# Patient Record
Sex: Female | Born: 1960 | Race: White | Hispanic: No | Marital: Married | Smoking: Never smoker
Health system: Southern US, Community
[De-identification: ages and names within clinical notes are randomized; demographics above are authoritative.]

## PROBLEM LIST (undated history)

## (undated) DIAGNOSIS — D649 Anemia, unspecified: Secondary | ICD-10-CM

## (undated) DIAGNOSIS — C50919 Malignant neoplasm of unspecified site of unspecified female breast: Secondary | ICD-10-CM

## (undated) DIAGNOSIS — D3A029 Benign carcinoid tumor of the large intestine, unspecified portion: Secondary | ICD-10-CM

## (undated) DIAGNOSIS — K760 Fatty (change of) liver, not elsewhere classified: Secondary | ICD-10-CM

## (undated) DIAGNOSIS — Z923 Personal history of irradiation: Secondary | ICD-10-CM

## (undated) DIAGNOSIS — Z973 Presence of spectacles and contact lenses: Secondary | ICD-10-CM

## (undated) DIAGNOSIS — Z9889 Other specified postprocedural states: Secondary | ICD-10-CM

## (undated) DIAGNOSIS — K219 Gastro-esophageal reflux disease without esophagitis: Secondary | ICD-10-CM

## (undated) DIAGNOSIS — J302 Other seasonal allergic rhinitis: Secondary | ICD-10-CM

## (undated) HISTORY — DX: Malignant neoplasm of unspecified site of unspecified female breast: C50.919

## (undated) HISTORY — DX: Presence of spectacles and contact lenses: Z97.3

## (undated) HISTORY — DX: Fatty (change of) liver, not elsewhere classified: K76.0

## (undated) HISTORY — DX: Other specified postprocedural states: Z98.890

## (undated) HISTORY — DX: Personal history of irradiation: Z92.3

---

## 1985-12-07 HISTORY — PX: BOWEL RESECTION: SHX1257

## 1985-12-07 HISTORY — PX: APPENDECTOMY: SHX54

## 1990-12-07 HISTORY — PX: DILATION AND CURETTAGE OF UTERUS: SHX78

## 1998-12-07 DIAGNOSIS — Z9889 Other specified postprocedural states: Secondary | ICD-10-CM

## 1998-12-07 HISTORY — DX: Other specified postprocedural states: Z98.890

## 1999-03-29 ENCOUNTER — Other Ambulatory Visit: Admission: RE | Admit: 1999-03-29 | Discharge: 1999-03-29 | Payer: Self-pay | Admitting: *Deleted

## 2000-08-13 ENCOUNTER — Ambulatory Visit (HOSPITAL_COMMUNITY): Admission: RE | Admit: 2000-08-13 | Discharge: 2000-08-13 | Payer: Self-pay | Admitting: *Deleted

## 2001-04-29 ENCOUNTER — Other Ambulatory Visit: Admission: RE | Admit: 2001-04-29 | Discharge: 2001-04-29 | Payer: Self-pay | Admitting: *Deleted

## 2001-12-07 HISTORY — PX: CHOLECYSTECTOMY: SHX55

## 2002-04-03 ENCOUNTER — Other Ambulatory Visit: Admission: RE | Admit: 2002-04-03 | Discharge: 2002-04-03 | Payer: Self-pay | Admitting: Obstetrics and Gynecology

## 2002-05-15 ENCOUNTER — Encounter: Admission: RE | Admit: 2002-05-15 | Discharge: 2002-05-15 | Payer: Self-pay | Admitting: Family Medicine

## 2002-05-15 ENCOUNTER — Encounter: Payer: Self-pay | Admitting: Family Medicine

## 2002-10-25 ENCOUNTER — Observation Stay (HOSPITAL_COMMUNITY): Admission: RE | Admit: 2002-10-25 | Discharge: 2002-10-26 | Payer: Self-pay | Admitting: General Surgery

## 2002-10-25 ENCOUNTER — Encounter: Payer: Self-pay | Admitting: General Surgery

## 2003-04-10 ENCOUNTER — Other Ambulatory Visit: Admission: RE | Admit: 2003-04-10 | Discharge: 2003-04-10 | Payer: Self-pay | Admitting: Obstetrics and Gynecology

## 2012-01-08 HISTORY — PX: BREAST BIOPSY: SHX20

## 2012-01-18 ENCOUNTER — Other Ambulatory Visit: Payer: Self-pay

## 2012-01-19 ENCOUNTER — Other Ambulatory Visit: Payer: Self-pay | Admitting: Radiology

## 2012-01-19 DIAGNOSIS — C50912 Malignant neoplasm of unspecified site of left female breast: Secondary | ICD-10-CM

## 2012-01-21 ENCOUNTER — Telehealth: Payer: Self-pay | Admitting: *Deleted

## 2012-01-21 ENCOUNTER — Other Ambulatory Visit: Payer: Self-pay | Admitting: *Deleted

## 2012-01-21 DIAGNOSIS — C50519 Malignant neoplasm of lower-outer quadrant of unspecified female breast: Secondary | ICD-10-CM

## 2012-01-21 NOTE — Telephone Encounter (Signed)
Confirmed [facility] for 02/03/12 at 0800 .  Instructions and contact information given.

## 2012-01-22 ENCOUNTER — Ambulatory Visit
Admission: RE | Admit: 2012-01-22 | Discharge: 2012-01-22 | Disposition: A | Discharge status: Home / Self Care | Payer: PRIVATE HEALTH INSURANCE | Source: Ambulatory Visit | Attending: Radiology | Admitting: Radiology

## 2012-01-22 DIAGNOSIS — C50912 Malignant neoplasm of unspecified site of left female breast: Secondary | ICD-10-CM

## 2012-01-22 MED ORDER — GADOBENATE DIMEGLUMINE 529 MG/ML IV SOLN
15.0000 mL | Freq: Once | INTRAVENOUS | Status: AC | PRN
  Administered 2012-01-22: 15 mL via INTRAVENOUS

## 2012-02-03 ENCOUNTER — Ambulatory Visit (HOSPITAL_BASED_OUTPATIENT_CLINIC_OR_DEPARTMENT_OTHER): Payer: PRIVATE HEALTH INSURANCE | Admitting: Surgery

## 2012-02-03 ENCOUNTER — Encounter: Payer: Self-pay | Admitting: Oncology

## 2012-02-03 ENCOUNTER — Ambulatory Visit
Admission: RE | Admit: 2012-02-03 | Discharge: 2012-02-03 | Disposition: A | Discharge status: Home / Self Care | Payer: PRIVATE HEALTH INSURANCE | Source: Ambulatory Visit | Attending: Radiation Oncology | Admitting: Radiation Oncology

## 2012-02-03 ENCOUNTER — Other Ambulatory Visit (HOSPITAL_BASED_OUTPATIENT_CLINIC_OR_DEPARTMENT_OTHER): Payer: PRIVATE HEALTH INSURANCE | Admitting: Lab

## 2012-02-03 ENCOUNTER — Encounter (HOSPITAL_COMMUNITY): Payer: Self-pay | Admitting: Pharmacy Technician

## 2012-02-03 ENCOUNTER — Other Ambulatory Visit: Payer: Self-pay | Admitting: *Deleted

## 2012-02-03 ENCOUNTER — Telehealth: Payer: Self-pay | Admitting: Oncology

## 2012-02-03 ENCOUNTER — Encounter: Payer: Self-pay | Admitting: *Deleted

## 2012-02-03 ENCOUNTER — Ambulatory Visit: Payer: PRIVATE HEALTH INSURANCE

## 2012-02-03 ENCOUNTER — Ambulatory Visit: Payer: PRIVATE HEALTH INSURANCE | Attending: Surgery | Admitting: Physical Therapy

## 2012-02-03 ENCOUNTER — Other Ambulatory Visit (INDEPENDENT_AMBULATORY_CARE_PROVIDER_SITE_OTHER): Payer: Self-pay | Admitting: Surgery

## 2012-02-03 ENCOUNTER — Ambulatory Visit (HOSPITAL_BASED_OUTPATIENT_CLINIC_OR_DEPARTMENT_OTHER): Payer: PRIVATE HEALTH INSURANCE | Admitting: Oncology

## 2012-02-03 VITALS — BP 129/88 | HR 101 | Temp 98.6°F | Ht 68.5 in | Wt 164.1 lb

## 2012-02-03 DIAGNOSIS — C50519 Malignant neoplasm of lower-outer quadrant of unspecified female breast: Secondary | ICD-10-CM

## 2012-02-03 DIAGNOSIS — IMO0001 Reserved for inherently not codable concepts without codable children: Secondary | ICD-10-CM | POA: Insufficient documentation

## 2012-02-03 DIAGNOSIS — R293 Abnormal posture: Secondary | ICD-10-CM | POA: Insufficient documentation

## 2012-02-03 LAB — COMPREHENSIVE METABOLIC PANEL
ALT: 29 U/L (ref 0–35)
CO2: 28 mEq/L (ref 19–32)
Chloride: 104 mEq/L (ref 96–112)
Potassium: 3.7 mEq/L (ref 3.5–5.3)
Sodium: 139 mEq/L (ref 135–145)
Total Bilirubin: 0.4 mg/dL (ref 0.3–1.2)
Total Protein: 7 g/dL (ref 6.0–8.3)

## 2012-02-03 LAB — CBC WITH DIFFERENTIAL/PLATELET
BASO%: 0.6 % (ref 0.0–2.0)
MCHC: 33.5 g/dL (ref 31.5–36.0)
MONO#: 0.5 10*3/uL (ref 0.1–0.9)
RBC: 4.53 10*6/uL (ref 3.70–5.45)
RDW: 13.8 % (ref 11.2–14.5)
WBC: 5.2 10*3/uL (ref 3.9–10.3)
lymph#: 1.3 10*3/uL (ref 0.9–3.3)

## 2012-02-03 LAB — CANCER ANTIGEN 27.29: CA 27.29: 65 U/mL — ABNORMAL HIGH (ref 0–39)

## 2012-02-03 NOTE — Progress Notes (Addendum)
Re:   Sydney Cervantes DOB:   May 19, 1961 MRN:   [id number]  [facility]  ASSESSMENT AND PLAN: 1.  Left breast cancer.  T4, N3, Mx, at 11 o'clock.  [place] - 4.5 cm, MRI - 7.6 cm extending to pectoralis muscle.  Lobular, grade 3.  ER - 63%, PR - 5%, Her@Neu  - neg., Ki67 -   Left axillary node biopsy positive.  Treating oncology - Rubin/Wentworth.  I discussed the options for breast cancer treatment with the patient.  I discussed the idea of a multidisciplinary approach to the treatment of breast cancer, which includes medical oncology and radiation oncology.  I discussed the surgical options of lumpectomy vs. mastectomy.  I discussed the options of lymph node biopsy, but she is biopsy proven positive.  So she will almost certainly need a lymph node dissection.  The treatment plan depends on the pathologic staging of the tumor and the patient's personal wishes.  The risks of surgery include, but are not limited to, bleeding, infection, the need for further surgery, and nerve injury.  The patient has been given literature on the treatment of breast cancer.  She will need neoadjuvant therapy.  It is hard to imagine that she will not need a mastectomy, axillary node dissection, and probable chest wall irradiation after neoadjuvant chemotx.  2.  Level 1, 2 and 3 nodes positive - by MRI. 3.  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  4.  Will need power port.  Risks include, but are not limited to, bleeding, infection, thrombosis, and pneumothorax.  I showed them the model of the porta cath.  5.  Will need genetics. 6.  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    REFERRING PHYSICIAN:  Dag Pavic, [facility].  HISTORY OF PRESENT ILLNESS: Sydney Cervantes is a 51 y.o. (DOB: 09/11/1961)  whtie female whose primary care physician is Debbora Dus, NP, [facility], and comes to me today for left breast cancer.  She felt a mass in her left breast for the first time in Sept., 2012.  The  mass seemed to wax and wane in size.  Her mother had breast cancer in her 67's.  She then had a recurrence.  She is still living.  She has an aunt who also has breast cancer.  She is still having regular periods.  Her LMP was Jan 07, 2012.  She thinks her last mammogram was around 2007.    Past Medical History  Diagnosis Date  . Hx of colonoscopy 2000  . Wears glasses       Past Surgical History  Procedure Date  . Appendectomy   . Bowel resection   . Cholecystectomy       No current outpatient prescriptions on file.     No Known Allergies  REVIEW OF SYSTEMS: Skin:  No history of rash.  No history of abnormal moles. Infection:  No history of hepatitis or HIV.  No history of MRSA. Neurologic:  No history of stroke.  No history of seizure.  No history of headaches. Cardiac:  No history of hypertension. No history of heart disease.  No history of prior cardiac catheterization.  No history of seeing a cardiologist. Pulmonary:  Does not smoke cigarettes.  No asthma or bronchitis.  No OSA/CPAP.  Endocrine:  No diabetes. No thyroid disease. Gastrointestinal: Lap chole - 2000's by Dr. Carolynne Edouard, Appendiceal carcinoid - 1987, last colonoscopy about 2000. Urologic:  No history of kidney stones.  No history of bladder infections. Musculoskeletal:  No history of joint or back disease. Hematologic:  No bleeding disorder.  No history of anemia.  Not anticoagulated. Psycho-social:  The patient is oriented.   The patient has no obvious psychologic or social impairment to understanding our conversation and plan.  SOCIAL and FAMILY HISTORY: Married.  Husband, [name], with patient. They have 3 daughters - 22,19, 58. She works for [employer] for [employer].  PHYSICAL EXAM: There were no vitals taken for this visit.  General: WN WF who is alert and generally healthy appearing.  HEENT: Normal. Pupils equal. Good dentition. Neck: Supple. No mass.  No thyroid mass.  Carotid pulse okay  with no bruit. Lymph Nodes:  No supraclavicular, cervical, or axillary nodes.  I am not sure I can feel a positive note. Lungs: Clear to auscultation and symmetric breath sounds. Heart:  RRR. No murmur or rub. Breasts:  Left:  Large left breast mass - 7+cm with dimpling at 5 o'clock position.  The breast is larger with suggestion of edema of skin.  Right - unremarkable.  Abdomen: Soft. No mass. No tenderness. No hernia. Normal bowel sounds.  Lower midline scar.   Rectal: Not done. Extremities:  Good strength and ROM  in upper and lower extremities. Neurologic:  Grossly intact to motor and sensory function. Psychiatric: Has normal mood and affect. Behavior is normal.   DATA REVIEWED: Path, mammogram, and MRI of breasts.  Ovidio Kin, MD,  [facility] [address] [place]  [address]   [place], [place]    [address] Phone:  [phone] FAX:  [phone]

## 2012-02-03 NOTE — Progress Notes (Signed)
Referral MD  Reason for Referral: locally advanced breast cancer   No chief complaint on file. : 51 yo previously healthy woman who presented with self-detected breast cancer in September of 2012. She sought medical attention for this a few weeks ago when it begin to enlarge. She underwent imaging studies as detailed below.The largest dimension of this mass was 7.6 cm using MRI. Biopsy of this mass revealed a high grade lobular type cancer, er 63 %, pr 5%, her 2 negative, with an elevated ki67. Lt axillary node -biopsy +; Level I,II, and III  Nodes appear to be involved . Diffuse skin thickening was also seen.Clinical stage T4N3.Patient denies any pain or discharge from the breast. She has no other systemic complaints. HPI:   Past Medical History  Diagnosis Date  . Hx of colonoscopy 2000  . Wears glasses   :  Past Surgical History  Procedure Date  . Appendectomy with incidental carcinoid leading to bowel resection, 1987   . Bowel resection   . Cholecystectomy   :  No current outpatient prescriptions on file.:    :  No Known Allergies:  Family History  Problem Relation Age of Onset  . Breast cancer Mother   . Breast cancer Paternal Aunt   :  History   Social History  . Marital Status: Married  X 11 y    Spouse Name: scott    Number of Children: 3- 1- [employer]; 1- [employer]; 1- science and math  . Years of Education: N/A   Occupational History  . 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   Social History Main Topics  . Smoking status: Never Smoker   . Smokeless tobacco: Not on file  . Alcohol Use: 2.4 oz/week    4 Glasses of wine per week  . Drug Use: No  . Sexually Active:    Other Topics Concern  . Not on file   Social History Narrative  . No narrative on file  :  @Reproductive  History G3P2 Menarche -11 Continues to have menses  A comprehensive review of systems was negative.  Exam:  @IPVITALS @ General appearance: alert,  cooperative and appears stated age Head: Normocephalic, without obvious abnormality, atraumatic Throat: lips, mucosa, and tongue normal; teeth and gums normal Resp: clear to auscultation bilaterally and normal percussion bilaterally Chest wall: no tenderness Breasts: normal appearance, no masses or tenderness, rt breast normal; lt breast diffusely edematous with limited movement of breast. nipple retraction is noted. the breast is involved with a large mass.palpable axillary adenopathy is noted. Cardio: regular rate and rhythm, S1, S2 normal, no murmur, click, rub or gallop and normal apical impulse GI: soft, non-tender; bowel sounds normal; no masses,  no organomegaly Extremities: extremities normal, atraumatic, no cyanosis or edema Pulses: 2+ and symmetric Lymph nodes: Cervical, supraclavicular, and axillary nodes normal. and + lt axillary involvement ; noother adenopathy. Neurologic: Alert and oriented X 3, normal strength and tone. Normal symmetric reflexes. Normal coordination and gait   Basename 02/03/12 0759  WBC 5.2  HGB 14.4  HCT 42.9  PLT 163    Basename 02/03/12 0759  NA 139  K 3.7  CL 104  CO2 28  GLUCOSE 108*  BUN 12  CREATININE 0.77  CALCIUM 9.3    Blood smear review: n/a  Pathology:n/a  Mr Breast Bilateral W Wo Contrast  01/22/2012  *RADIOLOGY REPORT*  Clinical Data: Recently diagnosed left breast invasive ductal carcinoma and metastatic left axillary adenopathy.  BILATERAL BREAST MRI WITH  AND WITHOUT CONTRAST  Technique: Multiplanar, multisequence MR images of both breasts were obtained prior to and following the intravenous administration of 15ml of MultiHance.  Three dimensional images were evaluated at the independent DynaCad workstation.  Comparison:  Recent mammogram, ultrasound and biopsy examinations at [facility].  Findings: Mild background parenchymal enhancement in both breasts. Large, irregular conglomeration of enhancing masses in the left  breast centered laterally.  This involves all four quadrants and measures 7.6 x 7.0 x 6.1 cm in maximum dimensions.  This contains a biopsy marker clip artifact inferiorly.  This has a mixture of enhancement kinetics, including rapid washin/washout.  Linear portions of the mass extend to the anterior aspect of the pectoralis major muscle with anterior traction of the muscle toward the mass.  There is a small amount of enhancement in the anterior aspect of the tented portion of the muscle.  Also demonstrated is a diffuse edema within the left breast with a decreased AP dimension and increased transverse dimension of the breast compared to the right.  There is also diffuse skin thickening with enhancement on the left as well as diffuse trabecular enhancement on the left.  Also demonstrated are multiple enlarged level I left axillary lymph nodes.  The largest node measures 2.9 x 2.1 cm in maximum dimensions in the transverse plane.  Also noted are multiple enlarged level II and level III left axillary lymph nodes.  The largest node is medial to the superior aspect of the pectoralis minor muscle on the left, measuring 1.7 x 0.7 cm in maximum dimensions.  Also demonstrated is an enhancing mass in the anterior aspect of the left upper chest wall, measuring 3.4 x 1.3 cm in maximum dimensions in the axial plane.  No enlarged internal mammary lymph nodes are seen separate from the left chest wall mass.  No mass or abnormal enhancement in the right breast suspicious for malignancy.  No abnormal appearing right axillary lymph nodes.  IMPRESSION:  1.  7.6 x 7.0 x 6.1 cm biopsy-proven invasive ductal carcinoma centered in the outer aspect of the left breast, involving all four quadrants. 2.  Diffuse left breast skin thickening and edema with enhancement, compatible with lymphatic invasion of tumor. 3.  Posterior extension of the left breast mass into the anterior aspect of a retracted portion of the left pectoralis major muscle.  4.  Level I, II and III left axillary metastatic adenopathy. 5.  Left upper, anterior chest wall metastasis. 6.  No evidence of malignancy on the right.  THREE-DIMENSIONAL MR IMAGE RENDERING ON INDEPENDENT WORKSTATION:  Three-dimensional MR images were rendered by post-processing of the original MR data on an independent workstation.  The three- dimensional MR images were interpreted, and findings were reported in the accompanying complete MRI report for this study.  BI-RADS CATEGORY 6:  Known biopsy-proven malignancy - appropriate action should be taken.  Recommendation:  Treatment plan.  Original Report Authenticated By: Darrol Angel, M.D.    Assessment and Plan: 51 yo woman with locally advanced breast cancer with biopsy proven  Ipsilateral adenopathy. We spent 45 minuted detailing treatment plans. If staging studies demonstrate distant metastatic diseasase then anti-estrogen therapy would be used ie using LHRH analgoues and tamoxifen. If there is no distant disease , I would go ahead with induction chemotherapy with a view to ultimately performa a mastectomy. I outlined side effects of both of these approaches. She will a staging PET and a port/ echo if stage 4 disease is not obvious.  A total of 70 minutes was spent with this patient   , 50% of the time in patient related counselling.  Pierce Crane MD, FRCPC

## 2012-02-03 NOTE — Progress Notes (Signed)
Patient came in today as a new patient,she fill out the preliminary financial assessment paper work,and also she only have one insurance,so i gave her an Epp Application to fill out.

## 2012-02-03 NOTE — Telephone Encounter (Signed)
gve the pt her ct scan appts along with the instructions and the echo appt. Pt is aware she will be contacted with the md appt

## 2012-02-03 NOTE — Progress Notes (Signed)
Clinical Social Worker met with pt in [facility].  CSW informed pt of the [facility] support team and supportive services available through the patient and family support center.  CSW provided pt with a patient and family support calender as well as information on additional monthly programs.  CSW encouraged pt and/or family to call with questions or concerns.  Pt did not have any questions at this time.  Tamala Julian, MSW, LCSW Clinical Social Worker [facility] [phone]

## 2012-02-04 ENCOUNTER — Encounter: Payer: Self-pay | Admitting: *Deleted

## 2012-02-04 NOTE — Progress Notes (Signed)
Mailed after appt letter to pt. 

## 2012-02-04 NOTE — Progress Notes (Signed)
CC:   Sandria Bales. Ezzard Standing, M.D. Pierce Crane, M.D., F.R.C.P.C. Lynden Ang, NP  DIAGNOSIS:  T4 N2 invasive lobular carcinoma of the left breast.  PREVIOUS INTERVENTIONS:  Biopsy of left breast mass and left axillary lymph node on 01/18/2012 revealing invasive mammary carcinoma in both the breast mass and node consistent with lobular carcinoma, grade 3, ER PR positive, HER-2 negative, Ki-67 33%.  HISTORY OF PRESENT ILLNESS:  Sydney Cervantes is a pleasant 51 year old female who had about a 3 month history of left breast mass.  It seemed to come and go with her menstrual cycle.  She ultimately sought evaluation when it did not go away.  A mammogram confirmed a large left breast mass. Ultrasound showed skin thickening and measured this mass at about 4.5 cm.  A biopsy of enlarged left axillary lymph nodes as well as the mass was performed on February 11th.  This was felt to be an invasive mammary carcinoma with likely lobular differentiation.  ER positive at 62%, PR positive at 5%, Ki-67 33%, and HER-2 negative.  An MRI of the bilateral breasts was performed on 01/22/2012 which showed a large conglomeration of masses in the left breast involving all 4 quadrants measuring 7.6 x 7 x 6.1 cm.  Involvement of the pectoralis muscle was also noted as well as diffuse skin edema.  Multiple enlarged left axillary lymph nodes in all stations were noticed with the largest measuring 2.9 cm.  An enhancing mass in the anterior aspect of the left upper chest wall was also noted measuring about 3.4 cm.  No systemic workup has been performed.  She was referred to Multidisciplinary Clinic today for consideration of treatment options.  Ms. Sundell had some soreness after her biopsy, but otherwise has done well.  She is accompanied by her husband today.  PAST MEDICAL HISTORY: 1. Appendectomy. 2. Bowel resection. 3. Cholecystectomy. 4. She had a carcinoid tumor of the appendix removed in 1987.  ALLERGIES:  No known  drug allergies.  MEDICATIONS:  None.  FAMILY HISTORY:  Her mother had breast cancer at 24.  She has a paternal aunt with breast cancer at an unknown age.  No other family history of malignancy.  SOCIAL HISTORY:  She works as a Economist.  She has 3 children who are all students.  She denies any tobacco use.  She does admit to social alcohol consumption with 4-5 glasses of wine per week.  She denies any illicit drug use.  GYN HISTORY:  Menses at 14.  She is still having regular periods.  She is GX, P3, with the 1st live childbirth at 7.  She did use intermittent birth control pills.  REVIEW OF SYSTEMS:  Positive for wearing glasses and a breast lump. Otherwise a 12 point review of systems was performed and was found to be negative.  PHYSICAL EXAMINATION:  Weight 164 pounds, height 5 feet 9 inches, blood pressure 129/88, pulse 101, respirations 20, temperature 98.6.  She has no palpable cervical or supraclavicular adenopathy.  Her right axilla has no palpable axillary adenopathy.  Her right breast has no palpable abnormalities.  Her left breast is hard and fixed.  Tumor involves basically the entire portion of the breast and there is edema within the breast skin.  There does not appear to be any peau de orange change or any erythema indicating an inflammatory process.  Her left axilla has several hardened lymph nodes which are fixed.  She is alert and oriented x3.  Her strength is 5/5  in her bilateral upper and lower extremities. Her gait is normal.  She seems to have appropriate mood, affect, insight and judgment.  IMPRESSION:  T4 N2 invasive lobular carcinoma of the left breast.  RECOMMENDATIONS:  We spoke to Ms. Bixby today regarding the need for systemic staging.  We discussed that breast conservation would likely not be an option due to the extent of involvement of the tumor and the residual breast tissue that was not involved.  We discussed the use  of neoadjuvant chemotherapy in the setting of local only disease to shrink down her tumor and make her surgery easier.  We discussed the role of radiation in the post mastectomy setting for patients with tumors over 5 cm or more than 1 lymph node positive, both of which criteria she does meet.  We discussed that sometimes with patients who have visceral metastatic disease, radiation is omitted after a mastectomy.  I told her I would plan on seeing her back after surgery if: 1. She had metastatic disease and had a great response to chemotherapy     with disease controlled elsewhere. 2. If she had local only disease after staging, and I would see her     back a month after her mastectomy. She seemed to be fine with these recommendations.  I told her it would be several months before I saw her again and I would go more into detail regarding the logistics of treatment.  We briefly discussed 6 weeks of treatment as an outpatient.  We discussed the main side effects being skin irritation and fatigue.  She seems to remember this from her mother's experience.  I have not scheduled follow up with me, but I would be happy to see her back at any point in the future.  She was also seen by a member our patient and family support staff as well as our Child psychotherapist.    ______________________________ Lurline Hare, M.D. SW/MEDQ  D:  02/03/2012  T:  02/04/2012  Job:  [id number]

## 2012-02-05 ENCOUNTER — Encounter: Payer: Self-pay | Admitting: *Deleted

## 2012-02-08 ENCOUNTER — Encounter: Payer: Self-pay | Admitting: *Deleted

## 2012-02-08 ENCOUNTER — Ambulatory Visit (HOSPITAL_BASED_OUTPATIENT_CLINIC_OR_DEPARTMENT_OTHER): Payer: PRIVATE HEALTH INSURANCE | Admitting: Genetic Counselor

## 2012-02-08 ENCOUNTER — Ambulatory Visit: Payer: PRIVATE HEALTH INSURANCE | Admitting: Lab

## 2012-02-08 DIAGNOSIS — Z803 Family history of malignant neoplasm of breast: Secondary | ICD-10-CM

## 2012-02-08 DIAGNOSIS — C50519 Malignant neoplasm of lower-outer quadrant of unspecified female breast: Secondary | ICD-10-CM

## 2012-02-08 NOTE — Progress Notes (Signed)
Dr.  Donnie Coffin requested a consultation for genetic counseling and risk assessment for Sydney Cervantes, a 51 y.o. female, for discussion of her personal and family history of breast cancer. She presents to clinic today to discuss the possibility of a genetic predisposition to cancer, and to further clarify her risks, as well as her family members' risks for cancer.   HISTORY OF PRESENT ILLNESS: In 2013, at the age of 57, Sydney Cervantes was diagnosed with invasive lobular carcinoma of the left breast. She has not had surgery or chemo scheduled at this time.     Past Medical History  Diagnosis Date  . Hx of colonoscopy 2000  . Wears glasses     Past Surgical History  Procedure Date  . Appendectomy   . Bowel resection   . Cholecystectomy     History  Substance Use Topics  . Smoking status: Never Smoker   . Smokeless tobacco: Not on file  . Alcohol Use: 2.4 oz/week    4 Glasses of wine per week    REPRODUCTIVE HISTORY AND PERSONAL RISK ASSESSMENT FACTORS: Menarche was at age 50.   premenopausal Uterus Intact: Yes Ovaries Intact: Yes G4P3A1 , first live birth at age 37  She has not previously undergone treatment for infertility.   OCP use for ~15years   She has not used HRT in the past.    FAMILY HISTORY:  We obtained a detailed, 4-generation family history.  Significant diagnoses are listed below: Family History  Problem Relation Age of Onset  . Breast cancer Mother   . Breast cancer Paternal Aunt   Ms. Jeune's mother was diagnosed with breast cancer in her mid 90s and again in her mid 73s.  She believes that the recurrance was in the same breast and not a bilateral cancer.  Ms. Chavana maternal grandmother was diagnosed with hodgkins lymphoma in her 69s.  There is no other cancer history on her maternal side of the family.  Ms. Rohm paternal aunt was diagnosed with breast cancer over the age of 75.  No other cancers were reported on the paternal side of the  family.  Patient's maternal ancestors are of [place] descent, and paternal ancestors are of [place] descent. There is no reported Ashkenazi Jewish ancestry. There is no  known consanguinity.  GENETIC COUNSELING RISK ASSESSMENT, DISCUSSION, AND SUGGESTED FOLLOW UP: We reviewed the natural history and genetic etiology of sporadic, familial and hereditary cancer syndromes.  We discussed that identification of a hereditary cancer syndrome may help her care providers tailor the patients medical management. We discussed both BRCA1 and BRCA2 genetic testing, as well as the [place] panel of 14 genes associated with breast cancer.  If a mutation indicating a gene mutation is detected in this case, the patient will be referred back to the referring provider and to any additional appropriate care providers to discuss the relevant options.   If a mutation is not found in the patient, this will decrease the likelihood of a BRCA1, BRCA2 or other hereditary gene syndrome as the explanation for her and her mother's/paternal aunt's breast cancer. Cancer surveillance options would be discussed for the patient according to the appropriate standard National Comprehensive Cancer Network and American Cancer Society guidelines, with consideration of their personal and family history risk factors. In this case, the patient will be referred back to their care providers for discussions of management.   After considering the risks, benefits, and limitations, the patient provided informed consent for  the following  testing:  BRCA1 and BRCA2 through [employer] and Breast Next Panel through [employer].   Per the patient's request, we will contact her by telephone to discuss these results. A follow up genetic counseling visit will be scheduled if indicated.  The patient was seen for a total of 60 minutes, greater than 50% of which was spent face-to-face counseling.  This plan is being carried out per  Dr. Renelda Loma recommendations.  This note will also be sent to the referring provider via the electronic medical record. The patient will be supplied with a summary of this genetic counseling discussion as well as educational information on the discussed hereditary cancer syndromes following the conclusion of their visit.   Patient was discussed with Dr. Drue Second.   EDUCATIONAL INFORMATION SUPPLIED TO PATIENT AT ENCOUNTER:  [facility]red[facility] Breast [facility] Ovarian Cancer Brochure  _______________________________________________________________________ For Office Staff:  Number of people involved in session: 3 Was an Intern/ student involved with case: no

## 2012-02-09 ENCOUNTER — Other Ambulatory Visit: Payer: PRIVATE HEALTH INSURANCE

## 2012-02-09 ENCOUNTER — Encounter: Payer: Self-pay | Admitting: *Deleted

## 2012-02-09 ENCOUNTER — Telehealth: Payer: Self-pay | Admitting: *Deleted

## 2012-02-09 ENCOUNTER — Ambulatory Visit (HOSPITAL_COMMUNITY)
Admission: RE | Admit: 2012-02-09 | Discharge: 2012-02-09 | Disposition: A | Discharge status: Home / Self Care | Payer: PRIVATE HEALTH INSURANCE | Source: Ambulatory Visit | Attending: Oncology | Admitting: Oncology

## 2012-02-09 DIAGNOSIS — C50919 Malignant neoplasm of unspecified site of unspecified female breast: Secondary | ICD-10-CM | POA: Insufficient documentation

## 2012-02-09 DIAGNOSIS — C801 Malignant (primary) neoplasm, unspecified: Secondary | ICD-10-CM

## 2012-02-09 NOTE — Progress Notes (Signed)
*  PRELIMINARY RESULTS* Echocardiogram 2D Echocardiogram has been performed.  Glean Salen [facility] 02/09/2012, 2:33 PM

## 2012-02-09 NOTE — Telephone Encounter (Signed)
Spoke with pt concerning [facility] from 2/27.  Pt denies questions or concerns regarding dx or treatment care plan.  Encourage pt to call with needs.  Received verbal understanding.  Contact information given. 

## 2012-02-10 ENCOUNTER — Encounter (HOSPITAL_COMMUNITY): Payer: Self-pay

## 2012-02-10 ENCOUNTER — Other Ambulatory Visit: Payer: PRIVATE HEALTH INSURANCE

## 2012-02-10 ENCOUNTER — Encounter (HOSPITAL_COMMUNITY)
Admission: RE | Admit: 2012-02-10 | Discharge: 2012-02-10 | Disposition: A | Discharge status: Home / Self Care | Payer: PRIVATE HEALTH INSURANCE | Source: Ambulatory Visit | Attending: Surgery | Admitting: Surgery

## 2012-02-10 ENCOUNTER — Encounter: Payer: Self-pay | Admitting: Oncology

## 2012-02-10 LAB — SURGICAL PCR SCREEN
MRSA, PCR: NEGATIVE
Staphylococcus aureus: NEGATIVE

## 2012-02-10 MED ORDER — CHLORHEXIDINE GLUCONATE 4 % EX LIQD
1.0000 "application " | Freq: Once | CUTANEOUS | Status: DC
  Filled 2012-02-10: qty 15

## 2012-02-10 MED ORDER — CEFAZOLIN SODIUM 1-5 GM-% IV SOLN
1.0000 g | INTRAVENOUS | Status: AC
  Administered 2012-02-11: 1 g via INTRAVENOUS
  Filled 2012-02-10: qty 50

## 2012-02-10 MED FILL — Perflutren Lipid Microsphere IV Susp 6.52 MG/ML: INTRAVENOUS | Qty: 2 | Status: AC

## 2012-02-10 NOTE — Pre-Procedure Instructions (Addendum)
[name]  02/10/2012   Your procedure is scheduled on:  Thursday March 7  Report to [facility] at 8:00 AM.  Call this number if you have problems the morning of surgery: [phone]   Remember:   Do not eat food:After Midnight.  May have clear liquids: up to 4 Hours before arrival.  Clear liquids include soda, tea, black coffee, apple or grape juice, broth.  Take these medicines the morning of surgery with A SIP OF WATER: Tylenol if needed   Do not wear jewelry, make-up or nail polish.  Do not wear lotions, powders, or perfumes. You may wear deodorant.  Do not shave 48 hours prior to surgery.  Do not bring valuables to the hospital.  Contacts, dentures or bridgework may not be worn into surgery.  Leave suitcase in the car. After surgery it may be brought to your room.  For patients admitted to the hospital, checkout time is 11:00 AM the day of discharge.   Patients discharged the day of surgery will not be allowed to drive home.  Name and phone number of your driver: [name] [phone]   Special Instructions: CHG Shower Use Special Wash: 1/2 bottle night before surgery and 1/2 bottle morning of surgery.   Please read over the following fact sheets that you were given: Pain Booklet, Coughing and Deep Breathing and Surgical Site Infection Prevention

## 2012-02-10 NOTE — Progress Notes (Signed)
Patient approve for [employer]% [employer]  02/10/12 - 08/12/12.

## 2012-02-11 ENCOUNTER — Ambulatory Visit (HOSPITAL_COMMUNITY): Payer: PRIVATE HEALTH INSURANCE

## 2012-02-11 ENCOUNTER — Ambulatory Visit (HOSPITAL_COMMUNITY): Payer: PRIVATE HEALTH INSURANCE | Admitting: Anesthesiology

## 2012-02-11 ENCOUNTER — Encounter (HOSPITAL_COMMUNITY): Payer: Self-pay | Admitting: Anesthesiology

## 2012-02-11 ENCOUNTER — Encounter (HOSPITAL_COMMUNITY)
Admission: RE | Disposition: A | Discharge status: Home / Self Care | Payer: Self-pay | Source: Ambulatory Visit | Attending: Surgery

## 2012-02-11 ENCOUNTER — Ambulatory Visit (HOSPITAL_COMMUNITY)
Admission: RE | Admit: 2012-02-11 | Discharge: 2012-02-11 | Disposition: A | Discharge status: Home / Self Care | Payer: PRIVATE HEALTH INSURANCE | Source: Ambulatory Visit | Attending: Surgery | Admitting: Surgery

## 2012-02-11 DIAGNOSIS — Z01818 Encounter for other preprocedural examination: Secondary | ICD-10-CM | POA: Insufficient documentation

## 2012-02-11 DIAGNOSIS — C50919 Malignant neoplasm of unspecified site of unspecified female breast: Secondary | ICD-10-CM

## 2012-02-11 DIAGNOSIS — Z01812 Encounter for preprocedural laboratory examination: Secondary | ICD-10-CM | POA: Insufficient documentation

## 2012-02-11 DIAGNOSIS — C773 Secondary and unspecified malignant neoplasm of axilla and upper limb lymph nodes: Secondary | ICD-10-CM | POA: Insufficient documentation

## 2012-02-11 DIAGNOSIS — C50519 Malignant neoplasm of lower-outer quadrant of unspecified female breast: Secondary | ICD-10-CM

## 2012-02-11 HISTORY — PX: PORTACATH PLACEMENT: SHX2246

## 2012-02-11 SURGERY — INSERTION, TUNNELED CENTRAL VENOUS DEVICE, WITH PORT
Anesthesia: Monitor Anesthesia Care | Site: Chest | Laterality: Right | Wound class: Clean

## 2012-02-11 MED ORDER — LACTATED RINGERS IV SOLN
INTRAVENOUS | Status: DC
  Administered 2012-02-11: 09:00:00 via INTRAVENOUS

## 2012-02-11 MED ORDER — PROPOFOL 10 MG/ML IV BOLUS
INTRAVENOUS | Status: DC | PRN
  Administered 2012-02-11 (×2): 20 mg via INTRAVENOUS
  Administered 2012-02-11: 40 mg via INTRAVENOUS

## 2012-02-11 MED ORDER — 0.9 % SODIUM CHLORIDE (POUR BTL) OPTIME
TOPICAL | Status: DC | PRN
  Administered 2012-02-11: 1000 mL

## 2012-02-11 MED ORDER — SODIUM CHLORIDE 0.9 % IR SOLN
Status: DC | PRN
  Administered 2012-02-11: 10:00:00

## 2012-02-11 MED ORDER — HYDROCODONE-ACETAMINOPHEN 5-325 MG PO TABS: 1.0000 | ORAL_TABLET | Freq: Four times a day (QID) | ORAL | Status: DC | PRN

## 2012-02-11 MED ORDER — HYDROMORPHONE HCL PF 1 MG/ML IJ SOLN: 0.2500 mg | INTRAMUSCULAR | Status: DC | PRN

## 2012-02-11 MED ORDER — DROPERIDOL 2.5 MG/ML IJ SOLN: 0.6250 mg | INTRAMUSCULAR | Status: DC | PRN

## 2012-02-11 MED ORDER — HEPARIN SOD (PORK) LOCK FLUSH 100 UNIT/ML IV SOLN
INTRAVENOUS | Status: DC | PRN
  Administered 2012-02-11: 450 [IU] via INTRAVENOUS

## 2012-02-11 MED ORDER — FENTANYL CITRATE 0.05 MG/ML IJ SOLN
INTRAMUSCULAR | Status: DC | PRN
  Administered 2012-02-11: 50 ug via INTRAVENOUS

## 2012-02-11 MED ORDER — MIDAZOLAM HCL 5 MG/5ML IJ SOLN
INTRAMUSCULAR | Status: DC | PRN
  Administered 2012-02-11: 2 mg via INTRAVENOUS

## 2012-02-11 MED ORDER — LIDOCAINE HCL (PF) 1 % IJ SOLN
INTRAMUSCULAR | Status: DC | PRN
  Administered 2012-02-11: 16 mL

## 2012-02-11 MED ORDER — LIDOCAINE HCL (CARDIAC) 20 MG/ML IV SOLN
INTRAVENOUS | Status: DC | PRN
  Administered 2012-02-11: 20 mg via INTRAVENOUS

## 2012-02-11 SURGICAL SUPPLY — 46 items
BAG DECANTER FOR FLEXI CONT (MISCELLANEOUS) ×2 IMPLANT
BENZOIN TINCTURE PRP APPL 2/3 (GAUZE/BANDAGES/DRESSINGS) ×2 IMPLANT
BLADE SURG 15 STRL LF DISP TIS (BLADE) ×1 IMPLANT
BLADE SURG 15 STRL SS (BLADE) ×1
CHLORAPREP W/TINT 10.5 ML (MISCELLANEOUS) ×2 IMPLANT
CLOTH BEACON ORANGE TIMEOUT ST (SAFETY) ×2 IMPLANT
COVER SURGICAL LIGHT HANDLE (MISCELLANEOUS) ×2 IMPLANT
CRADLE DONUT ADULT HEAD (MISCELLANEOUS) ×2 IMPLANT
DECANTER SPIKE VIAL GLASS SM (MISCELLANEOUS) ×2 IMPLANT
DERMABOND ADHESIVE PROPEN (GAUZE/BANDAGES/DRESSINGS) ×1
DERMABOND ADVANCED .7 DNX6 (GAUZE/BANDAGES/DRESSINGS) ×1 IMPLANT
DRAPE C-ARM 42X72 X-RAY (DRAPES) ×2 IMPLANT
DRAPE LAPAROSCOPIC ABDOMINAL (DRAPES) ×2 IMPLANT
DRAPE UTILITY 15X26 W/TAPE STR (DRAPE) ×4 IMPLANT
ELECT CAUTERY BLADE 6.4 (BLADE) ×2 IMPLANT
ELECT REM PT RETURN 9FT ADLT (ELECTROSURGICAL) ×2
ELECTRODE REM PT RTRN 9FT ADLT (ELECTROSURGICAL) ×1 IMPLANT
GAUZE SPONGE 4X4 16PLY XRAY LF (GAUZE/BANDAGES/DRESSINGS) ×2 IMPLANT
GLOVE BIO SURGEON STRL SZ7 (GLOVE) ×2 IMPLANT
GLOVE SURG SIGNA 7.5 PF LTX (GLOVE) ×2 IMPLANT
GOWN STRL NON-REIN LRG LVL3 (GOWN DISPOSABLE) IMPLANT
GOWN STRL REIN XL XLG (GOWN DISPOSABLE) ×4 IMPLANT
INTRODUCER 13FR (MISCELLANEOUS) IMPLANT
KIT BASIN OR (CUSTOM PROCEDURE TRAY) ×2 IMPLANT
KIT PORT POWER 9.6FR MRI PREA (Catheter) IMPLANT
KIT PORT POWER ISP 8FR (Catheter) IMPLANT
KIT POWER CATH 8FR (Catheter) ×2 IMPLANT
KIT ROOM TURNOVER OR (KITS) ×2 IMPLANT
NEEDLE HYPO 25GX1X1/2 BEV (NEEDLE) ×2 IMPLANT
NS IRRIG 1000ML POUR BTL (IV SOLUTION) ×2 IMPLANT
PACK SURGICAL SETUP 50X90 (CUSTOM PROCEDURE TRAY) ×2 IMPLANT
PAD ARMBOARD 7.5X6 YLW CONV (MISCELLANEOUS) ×4 IMPLANT
PENCIL BUTTON HOLSTER BLD 10FT (ELECTRODE) ×2 IMPLANT
SET SHEATH INTRODUCER 10FR (MISCELLANEOUS) IMPLANT
SHEATH COOK PEEL AWAY SET 9F (SHEATH) IMPLANT
SPONGE GAUZE 4X4 12PLY (GAUZE/BANDAGES/DRESSINGS) ×2 IMPLANT
STRIP CLOSURE SKIN 1/4X4 (GAUZE/BANDAGES/DRESSINGS) ×2 IMPLANT
SUT VIC AB 3-0 SH 18 (SUTURE) ×2 IMPLANT
SUT VIC AB 5-0 PS2 18 (SUTURE) ×2 IMPLANT
SYR 20ML ECCENTRIC (SYRINGE) ×4 IMPLANT
SYR 5ML LUER SLIP (SYRINGE) ×2 IMPLANT
SYR BULB 3OZ (MISCELLANEOUS) ×2 IMPLANT
SYR CONTROL 10ML LL (SYRINGE) ×2 IMPLANT
TOWEL OR 17X24 6PK STRL BLUE (TOWEL DISPOSABLE) ×2 IMPLANT
TOWEL OR 17X26 10 PK STRL BLUE (TOWEL DISPOSABLE) ×2 IMPLANT
WATER STERILE IRR 1000ML POUR (IV SOLUTION) IMPLANT

## 2012-02-11 NOTE — Brief Op Note (Signed)
02/11/2012  10:42 AM  PATIENT:  Sydney Cervantes, 51 y.o., female, MRN: [id number]  PREOP DIAGNOSIS:  left breast cancer  POSTOP DIAGNOSIS:   Left breast cancer, need of IV access in anticipation of chemotherapy  PROCEDURE:   Procedure(s):Right subclavian INSERTION PORT-A-CATH  SURGEON:   Ovidio Kin, M.D.  ASSISTANT:   none  ANESTHESIA:   IV sedation  Remonia Richter, MD - Anesthesiologist Rosita Fire, CRNA - CRNA  Monitor Anesthesia Care  EBL:  minimal  ml  BLOOD ADMINISTERED: none  DRAINS: none   LOCAL MEDICATIONS USED:   15 cc 1% xylocaine  SPECIMEN:   none  COUNTS CORRECT:  YES  INDICATIONS FOR PROCEDURE:  [name] is a 51 y.o. (DOB: 15-Sep-1961) white female whose primary care physician is Pcp Not In System and comes for power port placement for anticipated chemotx.   The indications and risks of the surgery were explained to the patient.  The risks include, but are not limited to, infection, bleeding, and nerve injury.  Note dictated to:   #[id number]

## 2012-02-11 NOTE — Transfer of Care (Signed)
Immediate Anesthesia Transfer of Care Note  Patient: Sydney Cervantes  Procedure(s) Performed: Procedure(s) (LRB): INSERTION PORT-A-CATH (Right)  Patient Location: PACU  Anesthesia Type: MAC  Level of Consciousness: awake, alert  and oriented  Airway & Oxygen Therapy: Patient Spontanous Breathing  Post-op Assessment: Report given to PACU RN, Post -op Vital signs reviewed and stable and Patient moving all extremities X 4  Post vital signs: Reviewed and stable  Complications: No apparent anesthesia complications

## 2012-02-11 NOTE — Discharge Instructions (Signed)
DISCHARGE INSTRUCTIONS TO PATIENT  Activity:  Driving - May drive tomorrow if doing well.   Lifting - No heavy lifting for 3 to 4 days (not more than 15 pounds)  Wound Care:   Leave wound dry for 2 days, then may shower.  Diet:  As tolerated  Follow up appointment:  Call Dr. Allene Pyo office [facility]) at [phone] for an appointment in 3 to 4 weeks.  Medications and dosages:  Resume your home medications.  You have a prescription for:  Vicodin.  Call Dr. Ezzard Standing or his office  [phone]) if you have:  Temperature greater than 100.4,  Persistent nausea and vomiting,  Severe uncontrolled pain,  Redness, tenderness, or signs of infection (pain, swelling, redness, odor or green/yellow discharge around the site),  Difficulty breathing, headache or visual disturbances,  Any other questions or concerns you may have after discharge.  In an emergency, call 911 or go to an Emergency Department at a nearby hospital.

## 2012-02-11 NOTE — H&P (View-Only) (Signed)
Re:   Sydney Cervantes DOB:   01-25-1961 MRN:   [id number]  [facility]  ASSESSMENT AND PLAN: 1.  Left breast cancer.  T4, N3, Mx, at 11 o'clock.  [place] - 4.5 cm, MRI - 37.6 cm extending to pectoralis muscle.  Lobular, grade 3.  ER - 63%, PR - 5%, Her@Neu  - neg., Ki67 -   Left axillary node biopsy positive.  Treating oncology - Rubin/Wentworth.  I discussed the options for breast cancer treatment with the patient.  I discussed the idea of a multidisciplinary approach to the treatment of breast cancer, which includes medical oncology and radiation oncology.  I discussed the surgical options of lumpectomy vs. mastectomy.  I discussed the options of lymph node biopsy, but she is biopsy proven positive.  So she will almost certainly need a lymph node dissection.  The treatment plan depends on the pathologic staging of the tumor and the patient's personal wishes.  The risks of surgery include, but are not limited to, bleeding, infection, the need for further surgery, and nerve injury.  The patient has been given literature on the treatment of breast cancer.  She will need neoadjuvant therapy.  It is hard to imagine that she will not need a mastectomy, axillary node dissection, and probable chest wall irradiation after neoadjuvant chemotx.  2.  Level 1, 2 and 3 nodes positive - by MRI. 3.  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  4.  Will need power port.  Risks include, but are not limited to, bleeding, infection, thrombosis, and pneumothorax.  I showed them the model of the porta cath.  5.  Will need genetics. 6.  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    REFERRING PHYSICIAN:  Dag Pavic, [facility].  HISTORY OF PRESENT ILLNESS: Sydney Cervantes is a 51 y.o. (DOB: Aug 10, 1961)  whtie female whose primary care physician is Debbora Dus, NP, [facility], and comes to me today for left breast cancer.  She felt a mass in her left breast for the first time in Sept., 2012.  The  mass seemed to wax and wane in size.  Her mother had breast cancer in her 15's.  She then had a recurrence.  She is still living.  She has an aunt who also has breast cancer.  She is still having regular periods.  Her LMP was Jan 07, 2012.  She thinks her last mammogram was around 2007.    Past Medical History  Diagnosis Date  . Hx of colonoscopy 2000  . Wears glasses       Past Surgical History  Procedure Date  . Appendectomy   . Bowel resection   . Cholecystectomy       No current outpatient prescriptions on file.     No Known Allergies  REVIEW OF SYSTEMS: Skin:  No history of rash.  No history of abnormal moles. Infection:  No history of hepatitis or HIV.  No history of MRSA. Neurologic:  No history of stroke.  No history of seizure.  No history of headaches. Cardiac:  No history of hypertension. No history of heart disease.  No history of prior cardiac catheterization.  No history of seeing a cardiologist. Pulmonary:  Does not smoke cigarettes.  No asthma or bronchitis.  No OSA/CPAP.  Endocrine:  No diabetes. No thyroid disease. Gastrointestinal: Lap chole - 2000's by Dr. Carolynne Edouard, Appendiceal carcinoid - 1987, last colonoscopy about 2000. Urologic:  No history of kidney stones.  No history of bladder infections. Musculoskeletal:  No history of joint or back disease. Hematologic:  No bleeding disorder.  No history of anemia.  Not anticoagulated. Psycho-social:  The patient is oriented.   The patient has no obvious psychologic or social impairment to understanding our conversation and plan.  SOCIAL and FAMILY HISTORY: Married.  Husband, [name], with patient. They have 3 daughters - 22,19, 7. She works for [employer] and [employer] for [employer].  PHYSICAL EXAM: There were no vitals taken for this visit.  General: WN WF who is alert and generally healthy appearing.  HEENT: Normal. Pupils equal. Good dentition. Neck: Supple. No mass.  No thyroid mass.  Carotid pulse okay  with no bruit. Lymph Nodes:  No supraclavicular, cervical, or axillary nodes.  I am not sure I can feel a positive note. Lungs: Clear to auscultation and symmetric breath sounds. Heart:  RRR. No murmur or rub. Breasts:  Left:  Large left breast mass - 7+cm with dimpling at 5 o'clock position.  The breast is larger with suggestion of edema of skin.  Right - unremarkable.  Abdomen: Soft. No mass. No tenderness. No hernia. Normal bowel sounds.  Lower midline scar.   Rectal: Not done. Extremities:  Good strength and ROM  in upper and lower extremities. Neurologic:  Grossly intact to motor and sensory function. Psychiatric: Has normal mood and affect. Behavior is normal.   DATA REVIEWED: Path, mammogram, and MRI of breasts.  Ovidio Kin, MD,  [facility] [address]  [address]   [place], [place]    [address] Phone:  [phone] FAX:  [phone]

## 2012-02-11 NOTE — Interval H&P Note (Signed)
History and Physical Interval Note:  02/11/2012 9:48 AM  Sydney Cervantes  has presented today for surgery, with the diagnosis of left breast cancer  The various methods of treatment have been discussed with the patient and family.  She's had a small rash with steri-strips.  She sees Dr. Donnie Coffin to start treatment next week.  After consideration of risks, benefits and other options for treatment, the patient has consented to  Procedure(s) (LRB): INSERTION PORT-A-CATH (N/A) as a surgical intervention .    The patients' history has been reviewed, patient examined, no change in status, stable for surgery.  I have reviewed the patients' chart and labs.  Questions were answered to the patient's satisfaction.     Jakeim Sedore H

## 2012-02-11 NOTE — Anesthesia Postprocedure Evaluation (Signed)
Anesthesia Post Note  Patient: Sydney Cervantes  Procedure(s) Performed: Procedure(s) (LRB): INSERTION PORT-A-CATH (Right)  Anesthesia type: MAC  Patient location: PACU  Post pain: Pain level controlled  Post assessment: Patient's Cardiovascular Status Stable  Last Vitals:  Filed Vitals:   02/11/12 1052  BP:   Pulse: 92  Temp:   Resp: 33    Post vital signs: Reviewed and stable  Level of consciousness: sedated  Complications: No apparent anesthesia complications

## 2012-02-11 NOTE — Preoperative (Signed)
Beta Blockers   Reason not to administer Beta Blockers:Not Applicable 

## 2012-02-11 NOTE — Anesthesia Procedure Notes (Signed)
Procedure Name: MAC Performed by: Rosita Fire

## 2012-02-11 NOTE — Anesthesia Preprocedure Evaluation (Signed)
Anesthesia Evaluation  Patient identified by MRN, date of birth, ID band Patient awake    Reviewed: Allergy & Precautions, H&P , NPO status , Patient's Chart, lab work & pertinent test results, reviewed documented beta blocker date and time   Airway Mallampati: II  Neck ROM: Full    Dental  (+) Dental Advisory Given and Teeth Intact   Pulmonary  breath sounds clear to auscultation  Pulmonary exam normal       Cardiovascular negative cardio ROS  Rhythm:Regular     Neuro/Psych negative neurological ROS     GI/Hepatic negative GI ROS, Neg liver ROS,   Endo/Other  negative endocrine ROS  Renal/GU negative Renal ROS     Musculoskeletal   Abdominal   Peds  Hematology negative hematology ROS (+)   Anesthesia Other Findings   Reproductive/Obstetrics                           Anesthesia Physical Anesthesia Plan  ASA: II  Anesthesia Plan: MAC   Post-op Pain Management:    Induction: Intravenous  Airway Management Planned: Simple Face Mask  Additional Equipment:   Intra-op Plan:   Post-operative Plan:   Informed Consent: I have reviewed the patients History and Physical, chart, labs and discussed the procedure including the risks, benefits and alternatives for the proposed anesthesia with the patient or authorized representative who has indicated his/her understanding and acceptance.   Dental advisory given  Plan Discussed with: CRNA, Anesthesiologist and Surgeon  Anesthesia Plan Comments:         Anesthesia Quick Evaluation

## 2012-02-11 NOTE — Progress Notes (Signed)
Call to dr. Ezzard Standing to report porta cath in r atrium without pnuemothorax

## 2012-02-12 ENCOUNTER — Other Ambulatory Visit (HOSPITAL_COMMUNITY): Payer: PRIVATE HEALTH INSURANCE

## 2012-02-12 ENCOUNTER — Encounter (HOSPITAL_COMMUNITY): Payer: Self-pay | Admitting: Surgery

## 2012-02-12 ENCOUNTER — Encounter (HOSPITAL_COMMUNITY)
Admission: RE | Admit: 2012-02-12 | Discharge: 2012-02-12 | Disposition: A | Discharge status: Home / Self Care | Payer: PRIVATE HEALTH INSURANCE | Source: Ambulatory Visit | Attending: Oncology | Admitting: Oncology

## 2012-02-12 DIAGNOSIS — C50519 Malignant neoplasm of lower-outer quadrant of unspecified female breast: Secondary | ICD-10-CM | POA: Insufficient documentation

## 2012-02-12 MED ORDER — FLUDEOXYGLUCOSE F - 18 (FDG) INJECTION
16.6000 | Freq: Once | INTRAVENOUS | Status: AC | PRN
  Administered 2012-02-12: 16.6 via INTRAVENOUS

## 2012-02-12 NOTE — Op Note (Signed)
NAME:  Sydney Cervantes, Sydney Cervantes                     ACCOUNT NO.:  MEDICAL RECORD NO.:  [id number]  LOCATION:                                 FACILITY:  PHYSICIAN:  Sandria Bales. Ezzard Standing, M.D.  DATE OF BIRTH:  1961-04-14  DATE OF PROCEDURE: 02/11/2012                              OPERATIVE REPORT  PREOPERATIVE DIAGNOSIS:  Left breast cancer, anticipate chemotherapy.  POSTOPERATIVE DIAGNOSIS:  Left breast cancer, need of IV access in anticipation of chemotherapy.  PROCEDURE:  Right subclavian PowerPort placement.  SURGEON:  Sandria Bales. Ezzard Standing, MD  FIRST ASSISTANT:  None.  ANESTHESIA:  IV sedation and local anesthesia is 15 mL of 1% Xylocaine.  COMPLICATION:  None.  INDICATION FOR PROCEDURE:  [name] is a 51 year old, white female, who sees Debbora Dus at [facility] as her primary care taker.  She has been found to have a large advanced left breast cancer, has seen Dr. Pierce Crane, Dr. Lurline Hare [facility] and is anticipating neoadjuvant chemotherapy.  I discussed with the patient, the indications, potential complications of PowerPort placement.  Potential complications include, but not limited to, bleeding, infection, thrombosis, and pneumothorax.  OPERATIVE NOTE:  The patient placed in a supine position with a roll under her back.  She had a IV sedation supervised by Dr. Adonis Huguenin, in room [facility] at the [facility].  Her upper chest was prepped with ChloraPrep and sterilely draped.  A time-out was held and surgical checklist run.  She was given 1 g of Ancef for initial procedure.  I accessed the right subclavian vein using the 16-gauge needle, threaded a guidewire into this.  There was trouble with the guidewire to flip down into the superior vena cava once it flowed down, it was in good position with fluoroscopy.  I then developed a pocket in the upper inner aspect of her right breast, and sewed the reservoir into this pocket and sewed in place with a  3-0 Vicryl suture.  I passed elastic tubing from the reservoir site to the right subclavian stick site, introduced into the right subclavian vein using 8-French introducer and tried to position the tip at the superior vena cava, right atrium junction.  The elastic tube was attached to the reservoir.  Entire unit had been flushed with dilute heparin and concentrated heparin, 100 units per mL, using about 4.5 mL of this.  The position was then checked again with fluoroscopy showing the reservoir and tubing and tip all in good position.  The wound was then irrigated.  Subcutaneous tissues closed with 3-0 Vicryl suture.  The skin closed with 5-0 Vicryl.  She had trouble with Steri-Strips, we used Dermabond on the skin.  I did use a total of 15 mL of 1% Xylocaine during the procedure.  Chest x-ray is pending at the time of dictation.   Sandria Bales. Ezzard Standing, M.D., FACS   DHN/MEDQ  D:  02/11/2012  T:  02/11/2012  Job:  [id number]  cc:   Ma Hillock, OB/GYN Isabella Stalling, NP Pierce Crane, M.D., F.R.C.P.C. Lurline Hare, M.D.

## 2012-02-15 ENCOUNTER — Telehealth: Payer: Self-pay | Admitting: Genetic Counselor

## 2012-02-15 ENCOUNTER — Ambulatory Visit: Payer: PRIVATE HEALTH INSURANCE | Admitting: Oncology

## 2012-02-15 NOTE — Telephone Encounter (Signed)
[facility] has put the test on hold until pre-dertermination has been assessed by [facility].  This can take between 15-30 business days.

## 2012-02-18 ENCOUNTER — Other Ambulatory Visit: Payer: Self-pay | Admitting: *Deleted

## 2012-02-18 DIAGNOSIS — C50519 Malignant neoplasm of lower-outer quadrant of unspecified female breast: Secondary | ICD-10-CM

## 2012-02-19 ENCOUNTER — Telehealth: Payer: Self-pay | Admitting: *Deleted

## 2012-02-19 ENCOUNTER — Ambulatory Visit (HOSPITAL_BASED_OUTPATIENT_CLINIC_OR_DEPARTMENT_OTHER): Payer: PRIVATE HEALTH INSURANCE | Admitting: Oncology

## 2012-02-19 ENCOUNTER — Other Ambulatory Visit: Payer: Self-pay | Admitting: *Deleted

## 2012-02-19 ENCOUNTER — Encounter: Payer: Self-pay | Admitting: *Deleted

## 2012-02-19 ENCOUNTER — Other Ambulatory Visit (HOSPITAL_BASED_OUTPATIENT_CLINIC_OR_DEPARTMENT_OTHER): Payer: PRIVATE HEALTH INSURANCE

## 2012-02-19 VITALS — BP 121/79 | HR 111 | Temp 98.6°F | Ht 68.0 in | Wt 164.3 lb

## 2012-02-19 DIAGNOSIS — C50519 Malignant neoplasm of lower-outer quadrant of unspecified female breast: Secondary | ICD-10-CM

## 2012-02-19 DIAGNOSIS — C773 Secondary and unspecified malignant neoplasm of axilla and upper limb lymph nodes: Secondary | ICD-10-CM

## 2012-02-19 LAB — COMPREHENSIVE METABOLIC PANEL
AST: 28 U/L (ref 0–37)
Albumin: 4.2 g/dL (ref 3.5–5.2)
Alkaline Phosphatase: 57 U/L (ref 39–117)
BUN: 13 mg/dL (ref 6–23)
Glucose, Bld: 88 mg/dL (ref 70–99)
Potassium: 3.7 mEq/L (ref 3.5–5.3)
Sodium: 139 mEq/L (ref 135–145)
Total Bilirubin: 0.4 mg/dL (ref 0.3–1.2)

## 2012-02-19 LAB — CBC WITH DIFFERENTIAL/PLATELET
Basophils Absolute: 0 10*3/uL (ref 0.0–0.1)
EOS%: 5.6 % (ref 0.0–7.0)
LYMPH%: 21.9 % (ref 14.0–49.7)
MCH: 32.1 pg (ref 25.1–34.0)
MCV: 95.9 fL (ref 79.5–101.0)
MONO%: 9.6 % (ref 0.0–14.0)
Platelets: 170 10*3/uL (ref 145–400)
RBC: 4.36 10*6/uL (ref 3.70–5.45)
RDW: 13.9 % (ref 11.2–14.5)

## 2012-02-19 MED ORDER — LORAZEPAM 0.5 MG PO TABS: 0.5000 mg | ORAL_TABLET | Freq: Four times a day (QID) | ORAL | Status: DC | PRN

## 2012-02-19 MED ORDER — PROCHLORPERAZINE MALEATE 10 MG PO TABS: 10.0000 mg | ORAL_TABLET | Freq: Four times a day (QID) | ORAL | Status: DC | PRN

## 2012-02-19 MED ORDER — LIDOCAINE-PRILOCAINE 2.5-2.5 % EX CREA: TOPICAL_CREAM | CUTANEOUS | Status: DC | PRN

## 2012-02-19 MED ORDER — DEXAMETHASONE 4 MG PO TABS: ORAL_TABLET | ORAL | Status: DC

## 2012-02-19 MED ORDER — PROCHLORPERAZINE 25 MG RE SUPP: 25.0000 mg | Freq: Two times a day (BID) | RECTAL | Status: DC | PRN

## 2012-02-19 MED ORDER — LIDOCAINE-PRILOCAINE 2.5-2.5 % EX CREA: TOPICAL_CREAM | Freq: Once | CUTANEOUS | Status: DC

## 2012-02-19 MED ORDER — ONDANSETRON HCL 8 MG PO TABS: ORAL_TABLET | ORAL | Status: DC

## 2012-02-19 NOTE — Telephone Encounter (Signed)
gave patient appointment for 02-23-2012 starting chemo treatment will see Sydney Cervantes on 03-01-2012 printed out calendar and gave it to the patient

## 2012-02-19 NOTE — Progress Notes (Signed)
RECEIVED A FAX FROM [employer] CONCERNING A PRIOR AUTHORIZATION FOR ONDANSETRON. THIS REQUEST WAS PLACED IN THE MANAGED CARE BIN FOR [name].

## 2012-02-22 ENCOUNTER — Other Ambulatory Visit: Payer: Self-pay | Admitting: Certified Registered Nurse Anesthetist

## 2012-02-22 ENCOUNTER — Telehealth: Payer: Self-pay | Admitting: *Deleted

## 2012-02-22 NOTE — Telephone Encounter (Signed)
Spoke with pt concerning anti-nausea medications pre and post chemotherapy.  Confirmed chemo and injection appts for 3/19 and 3/20.  Pt denies further needs or concerns at this time.  Encourage pt to call with needs. Received verbal understanding.  Contact information given.

## 2012-02-23 ENCOUNTER — Encounter: Payer: Self-pay | Admitting: Oncology

## 2012-02-23 ENCOUNTER — Ambulatory Visit (HOSPITAL_BASED_OUTPATIENT_CLINIC_OR_DEPARTMENT_OTHER): Payer: PRIVATE HEALTH INSURANCE

## 2012-02-23 VITALS — BP 114/79 | HR 90 | Temp 97.7°F

## 2012-02-23 DIAGNOSIS — C50519 Malignant neoplasm of lower-outer quadrant of unspecified female breast: Secondary | ICD-10-CM

## 2012-02-23 DIAGNOSIS — Z5111 Encounter for antineoplastic chemotherapy: Secondary | ICD-10-CM

## 2012-02-23 MED ORDER — FLUOROURACIL CHEMO INJECTION 2.5 GM/50ML
500.0000 mg/m2 | Freq: Once | INTRAVENOUS | Status: AC
  Administered 2012-02-23: 950 mg via INTRAVENOUS
  Filled 2012-02-23: qty 19

## 2012-02-23 MED ORDER — SODIUM CHLORIDE 0.9 % IV SOLN
500.0000 mg/m2 | Freq: Once | INTRAVENOUS | Status: AC
  Administered 2012-02-23: 940 mg via INTRAVENOUS
  Filled 2012-02-23: qty 47

## 2012-02-23 MED ORDER — PALONOSETRON HCL INJECTION 0.25 MG/5ML
0.2500 mg | Freq: Once | INTRAVENOUS | Status: AC
  Administered 2012-02-23: 0.25 mg via INTRAVENOUS

## 2012-02-23 MED ORDER — DEXAMETHASONE SODIUM PHOSPHATE 4 MG/ML IJ SOLN
12.0000 mg | Freq: Once | INTRAMUSCULAR | Status: AC
  Administered 2012-02-23: 12 mg via INTRAVENOUS

## 2012-02-23 MED ORDER — EPIRUBICIN HCL CHEMO IV INJECTION 200 MG/100ML
100.0000 mg/m2 | Freq: Once | INTRAVENOUS | Status: AC
  Administered 2012-02-23: 190 mg via INTRAVENOUS
  Filled 2012-02-23: qty 95

## 2012-02-23 MED ORDER — SODIUM CHLORIDE 0.9 % IV SOLN
Freq: Once | INTRAVENOUS | Status: AC
  Administered 2012-02-23: 100 mL via INTRAVENOUS

## 2012-02-23 MED ORDER — SODIUM CHLORIDE 0.9 % IV SOLN
150.0000 mg | Freq: Once | INTRAVENOUS | Status: AC
  Administered 2012-02-23: 150 mg via INTRAVENOUS
  Filled 2012-02-23: qty 5

## 2012-02-23 NOTE — Patient Instructions (Signed)
Cyclophosphamide injection What is this medicine? CYCLOPHOSPHAMIDE (sye kloe FOSS fa mide) is a chemotherapy drug. It slows the growth of cancer cells. This medicine is used to treat many types of cancer like lymphoma, myeloma, leukemia, breast cancer, and ovarian cancer, to name a few. It is also used to treat nephrotic syndrome in children. This medicine may be used for other purposes; ask your health care provider or pharmacist if you have questions. What should I tell my health care provider before I take this medicine? They need to know if you have any of these conditions: -blood disorders -history of other chemotherapy -history of radiation therapy -infection -kidney disease -liver disease -tumors in the bone marrow -an unusual or allergic reaction to cyclophosphamide, other chemotherapy, other medicines, foods, dyes, or preservatives -pregnant or trying to get pregnant -breast-feeding How should I use this medicine? This drug is usually given as an injection into a vein or muscle or by infusion into a vein. It is administered in a hospital or clinic by a specially trained health care professional. Talk to your pediatrician regarding the use of this medicine in children. While this drug may be prescribed for selected conditions, precautions do apply. Overdosage: If you think you have taken too much of this medicine contact a poison control center or emergency room at once. NOTE: This medicine is only for you. Do not share this medicine with others. What if I miss a dose? It is important not to miss your dose. Call your doctor or health care professional if you are unable to keep an appointment. What may interact with this medicine? Do not take this medicine with any of the following medications: -mibefradil -nalidixic acid This medicine may also interact with the following medications: -doxorubicin -etanercept -medicines to increase blood counts like filgrastim, pegfilgrastim,  sargramostim -medicines that block muscle or nerve pain -St. John's Wort -phenobarbital -succinylcholine chloride -trastuzumab -vaccines Talk to your doctor or health care professional before taking any of these medicines: -acetaminophen -aspirin -ibuprofen -ketoprofen -naproxen This list may not describe all possible interactions. Give your health care provider a list of all the medicines, herbs, non-prescription drugs, or dietary supplements you use. Also tell them if you smoke, drink alcohol, or use illegal drugs. Some items may interact with your medicine. What should I watch for while using this medicine? Visit your doctor for checks on your progress. This drug may make you feel generally unwell. This is not uncommon, as chemotherapy can affect healthy cells as well as cancer cells. Report any side effects. Continue your course of treatment even though you feel ill unless your doctor tells you to stop. Drink water or other fluids as directed. Urinate often, even at night. In some cases, you may be given additional medicines to help with side effects. Follow all directions for their use. Call your doctor or health care professional for advice if you get a fever, chills or sore throat, or other symptoms of a cold or flu. Do not treat yourself. This drug decreases your body's ability to fight infections. Try to avoid being around people who are sick. This medicine may increase your risk to bruise or bleed. Call your doctor or health care professional if you notice any unusual bleeding. Be careful brushing and flossing your teeth or using a toothpick because you may get an infection or bleed more easily. If you have any dental work done, tell your dentist you are receiving this medicine. Avoid taking products that contain aspirin, acetaminophen, ibuprofen, naproxen,  or ketoprofen unless instructed by your doctor. These medicines may hide a fever. Do not become pregnant while taking this  medicine. Women should inform their doctor if they wish to become pregnant or think they might be pregnant. There is a potential for serious side effects to an unborn child. Talk to your health care professional or pharmacist for more information. Do not breast-feed an infant while taking this medicine. Men should inform their doctor if they wish to father a child. This medicine may lower sperm counts. If you are going to have surgery, tell your doctor or health care professional that you have taken this medicine. What side effects may I notice from receiving this medicine? Side effects that you should report to your doctor or health care professional as soon as possible: -allergic reactions like skin rash, itching or hives, swelling of the face, lips, or tongue -low blood counts - this medicine may decrease the number of white blood cells, red blood cells and platelets. You may be at increased risk for infections and bleeding. -signs of infection - fever or chills, cough, sore throat, pain or difficulty passing urine -signs of decreased platelets or bleeding - bruising, pinpoint red spots on the skin, black, tarry stools, blood in the urine -signs of decreased red blood cells - unusually weak or tired, fainting spells, lightheadedness -breathing problems -dark urine -mouth sores -pain, swelling, redness at site where injected -swelling of the ankles, feet, hands -trouble passing urine or change in the amount of urine -weight gain -yellowing of the eyes or skin Side effects that usually do not require medical attention (report to your doctor or health care professional if they continue or are bothersome): -changes in nail or skin color -diarrhea -hair loss -loss of appetite -missed menstrual periods -nausea, vomiting -stomach pain This list may not describe all possible side effects. Call your doctor for medical advice about side effects. You may report side effects to FDA at  1-800-FDA-1088. Where should I keep my medicine? This drug is given in a hospital or clinic and will not be stored at home. NOTE: This sheet is a summary. It may not cover all possible information. If you have questions about this medicine, talk to your doctor, pharmacist, or health care provider.  2012, [employer]. (02/28/2008 2:32:25 PM)Fluorouracil, 5-FU injection What is this medicine? FLUOROURACIL, 5-FU (flure oh YOOR a sil) is a chemotherapy drug. It slows the growth of cancer cells. This medicine is used to treat many types of cancer like breast cancer, colon or rectal cancer, pancreatic cancer, and stomach cancer. This medicine may be used for other purposes; ask your health care provider or pharmacist if you have questions. What should I tell my health care provider before I take this medicine? They need to know if you have any of these conditions: -blood disorders -dihydropyrimidine dehydrogenase (DPD) deficiency -infection (especially a virus infection such as chickenpox, cold sores, or herpes) -kidney disease -liver disease -malnourished, poor nutrition -recent or ongoing radiation therapy -an unusual or allergic reaction to fluorouracil, other chemotherapy, other medicines, foods, dyes, or preservatives -pregnant or trying to get pregnant -breast-feeding How should I use this medicine? This drug is given as an infusion or injection into a vein. It is administered in a hospital or clinic by a specially trained health care professional. Talk to your pediatrician regarding the use of this medicine in children. Special care may be needed. Overdosage: If you think you have taken too much of this medicine contact a  poison control center or emergency room at once. NOTE: This medicine is only for you. Do not share this medicine with others. What if I miss a dose? It is important not to miss your dose. Call your doctor or health care professional if you are unable to keep an  appointment. What may interact with this medicine? -allopurinol -cimetidine -dapsone -digoxin -hydroxyurea -leucovorin -levamisole -medicines for seizures like ethotoin, fosphenytoin, phenytoin -medicines to increase blood counts like filgrastim, pegfilgrastim, sargramostim -medicines that treat or prevent blood clots like warfarin, enoxaparin, and dalteparin -methotrexate -metronidazole -pyrimethamine -some other chemotherapy drugs like busulfan, cisplatin, estramustine, vinblastine -trimethoprim -trimetrexate -vaccines Talk to your doctor or health care professional before taking any of these medicines: -acetaminophen -aspirin -ibuprofen -ketoprofen -naproxen This list may not describe all possible interactions. Give your health care provider a list of all the medicines, herbs, non-prescription drugs, or dietary supplements you use. Also tell them if you smoke, drink alcohol, or use illegal drugs. Some items may interact with your medicine. What should I watch for while using this medicine? Visit your doctor for checks on your progress. This drug may make you feel generally unwell. This is not uncommon, as chemotherapy can affect healthy cells as well as cancer cells. Report any side effects. Continue your course of treatment even though you feel ill unless your doctor tells you to stop. In some cases, you may be given additional medicines to help with side effects. Follow all directions for their use. Call your doctor or health care professional for advice if you get a fever, chills or sore throat, or other symptoms of a cold or flu. Do not treat yourself. This drug decreases your body's ability to fight infections. Try to avoid being around people who are sick. This medicine may increase your risk to bruise or bleed. Call your doctor or health care professional if you notice any unusual bleeding. Be careful brushing and flossing your teeth or using a toothpick because you may get  an infection or bleed more easily. If you have any dental work done, tell your dentist you are receiving this medicine. Avoid taking products that contain aspirin, acetaminophen, ibuprofen, naproxen, or ketoprofen unless instructed by your doctor. These medicines may hide a fever. Do not become pregnant while taking this medicine. Women should inform their doctor if they wish to become pregnant or think they might be pregnant. There is a potential for serious side effects to an unborn child. Talk to your health care professional or pharmacist for more information. Do not breast-feed an infant while taking this medicine. Men should inform their doctor if they wish to father a child. This medicine may lower sperm counts. Do not treat diarrhea with over the counter products. Contact your doctor if you have diarrhea that lasts more than 2 days or if it is severe and watery. This medicine can make you more sensitive to the sun. Keep out of the sun. If you cannot avoid being in the sun, wear protective clothing and use sunscreen. Do not use sun lamps or tanning beds/booths. What side effects may I notice from receiving this medicine? Side effects that you should report to your doctor or health care professional as soon as possible: -allergic reactions like skin rash, itching or hives, swelling of the face, lips, or tongue -low blood counts - this medicine may decrease the number of white blood cells, red blood cells and platelets. You may be at increased risk for infections and bleeding. -signs of infection -  fever or chills, cough, sore throat, pain or difficulty passing urine -signs of decreased platelets or bleeding - bruising, pinpoint red spots on the skin, black, tarry stools, blood in the urine -signs of decreased red blood cells - unusually weak or tired, fainting spells, lightheadedness -breathing problems -changes in vision -chest pain -mouth sores -nausea and vomiting -pain, swelling, redness  at site where injected -pain, tingling, numbness in the hands or feet -redness, swelling, or sores on hands or feet -stomach pain -unusual bleeding Side effects that usually do not require medical attention (report to your doctor or health care professional if they continue or are bothersome): -changes in finger or toe nails -diarrhea -dry or itchy skin -hair loss -headache -loss of appetite -sensitivity of eyes to the light -stomach upset -unusually teary eyes This list may not describe all possible side effects. Call your doctor for medical advice about side effects. You may report side effects to FDA at 1-800-FDA-1088. Where should I keep my medicine? This drug is given in a hospital or clinic and will not be stored at home. NOTE: This sheet is a summary. It may not cover all possible information. If you have questions about this medicine, talk to your doctor, pharmacist, or health care provider.  2012, [employer]. (03/28/2008 1:53:16 PM)Epirubicin injection What is this medicine? EPIRUBICIN (ep i ROO bi sin) is a chemotherapy drug. This medicine is used to treat breast cancer. This medicine may be used for other purposes; ask your health care provider or pharmacist if you have questions. What should I tell my health care provider before I take this medicine? They need to know if you have any of these conditions: -blood disorders -heart disease, recent heart attack -infection (especially a virus infection such as chickenpox, cold sores, or herpes) -irregular heartbeat -kidney disease -liver disease -recent or ongoing radiation therapy -an unusual or allergic reaction to epirubicin, other chemotherapy agents, other medicines, foods, dyes, or preservatives -pregnant or trying to get pregnant -breast-feeding How should I use this medicine? This drug is given as an infusion into a vein. It is administered in a hospital or clinic by a specially trained health care  professional. If you have pain, swelling, burning or any unusual feeling around the site of your injection, tell your health care professional right away. Talk to your pediatrician regarding the use of this medicine in children. Special care may be needed. Overdosage: If you think you have taken too much of this medicine contact a poison control center or emergency room at once. NOTE: This medicine is only for you. Do not share this medicine with others. What if I miss a dose? It is important not to miss your dose. Call your doctor or health care professional if you are unable to keep an appointment. What may interact with this medicine? Do not take this medicine with any of the following medications: -cisapride -droperidol -halofantrine -pimozide This medicine may also interact with the following medications: -chloroquine -chlorpromazine -clarithromycin -cimetidine -cyclosporine -erythromycin -medicines for blood pressure like amlodipine, felodipine, nifedipine -medicines for depression, anxiety, or psychotic disturbances -medicines for irregular heart beat like amiodarone, bepridil, dofetilide, encainide, flecainide, propafenone, quinidine -medicines for nausea, vomiting like dolasetron, ondansetron, palonosetron -medicines to increase blood counts like filgrastim, pegfilgrastim, sargramostim -methadone -methotrexate -pentamidine -vaccines Talk to your doctor or health care professional before taking any of these medicines: -acetaminophen -aspirin -ibuprofen -ketoprofen -naproxen This list may not describe all possible interactions. Give your health care provider a list of all the  medicines, herbs, non-prescription drugs, or dietary supplements you use. Also tell them if you smoke, drink alcohol, or use illegal drugs. Some items may interact with your medicine. What should I watch for while using this medicine? Your condition will be monitored carefully while you are receiving  this medicine. You will need important blood work done while you are taking this medicine. This drug may make you feel generally unwell. This is not uncommon, as chemotherapy can affect healthy cells as well as cancer cells. Report any side effects. Continue your course of treatment even though you feel ill unless your doctor tells you to stop. Your urine may turn red for a few days after your dose. This is not blood. If your urine is dark or brown, call your doctor. In some cases, you may be given additional medicines to help with side effects. Follow all directions for their use. Call your doctor or health care professional for advice if you get a fever, chills or sore throat, or other symptoms of a cold or flu. Do not treat yourself. This drug decreases your body's ability to fight infections. Try to avoid being around people who are sick. This medicine may increase your risk to bruise or bleed. Call your doctor or health care professional if you notice any unusual bleeding. Be careful brushing and flossing your teeth or using a toothpick because you may get an infection or bleed more easily. If you have any dental work done, tell your dentist you are receiving this medicine. Avoid taking products that contain aspirin, acetaminophen, ibuprofen, naproxen, or ketoprofen unless instructed by your doctor. These medicines may hide a fever. Men and women of childbearing age should use effective birth control methods while using taking this medicine. Do not become pregnant while taking this medicine. There is a potential for serious side effects to an unborn child. Talk to your health care professional or pharmacist for more information. Do not breast-feed an infant while taking this medicine. What side effects may I notice from receiving this medicine? Side effects that you should report to your doctor or health care professional as soon as possible: -allergic reactions like skin rash, itching or hives,  swelling of the face, lips, or tongue -low blood counts - this medicine may decrease the number of white blood cells, red blood cells and platelets. You may be at increased risk for infections and bleeding. -signs of infection - fever or chills, cough, sore throat, pain or difficulty passing urine -signs of decreased platelets or bleeding - bruising, pinpoint red spots on the skin, black, tarry stools, blood in the urine -signs of decreased red blood cells - unusually weak or tired, fainting spells, lightheadedness -breathing problems -chest pain -gout pain -fast, irregular heartbeat -mouth sores -pain, swelling, redness at site where injected -swelling of ankles, feet, or hands Side effects that usually do not require medical attention (report to your doctor or health care professional if they continue or are bothersome): -changes in skin or nail color -diarrhea -hair loss -hot flashes, facial flushing -increased skin sensitivity to the sun -loss of appetite -nausea, vomiting -red colored urine -stomach upset This list may not describe all possible side effects. Call your doctor for medical advice about side effects. You may report side effects to FDA at 1-800-FDA-1088. Where should I keep my medicine? This drug is given in a hospital or clinic and will not be stored at home. NOTE: This sheet is a summary. It may not cover all possible information. If  you have questions about this medicine, talk to your doctor, pharmacist, or health care provider.  2012, [employer]. (03/26/2008 5:21:25 PM)

## 2012-02-23 NOTE — Progress Notes (Signed)
Faxed prior auth form to [employer] [facility] for ondansetron authorization. Phone # [phone] fax # [phone].

## 2012-02-24 ENCOUNTER — Ambulatory Visit (HOSPITAL_BASED_OUTPATIENT_CLINIC_OR_DEPARTMENT_OTHER): Payer: PRIVATE HEALTH INSURANCE

## 2012-02-24 ENCOUNTER — Other Ambulatory Visit: Payer: Self-pay | Admitting: Oncology

## 2012-02-24 ENCOUNTER — Telehealth: Payer: Self-pay | Admitting: *Deleted

## 2012-02-24 VITALS — BP 120/79 | HR 94 | Temp 98.5°F

## 2012-02-24 DIAGNOSIS — C50519 Malignant neoplasm of lower-outer quadrant of unspecified female breast: Secondary | ICD-10-CM

## 2012-02-24 MED ORDER — PEGFILGRASTIM INJECTION 6 MG/0.6ML
6.0000 mg | Freq: Once | SUBCUTANEOUS | Status: AC
  Administered 2012-02-24: 6 mg via SUBCUTANEOUS
  Filled 2012-02-24: qty 0.6

## 2012-02-24 NOTE — Telephone Encounter (Signed)
Pt was called to check on for f/u of [facility] 02/23/12 & reports that she is doing well.  She reports not sleeping well last hs but was already warned of this due to the steroids.   She was reminded of call back # & to call if any problems.

## 2012-02-25 ENCOUNTER — Encounter: Payer: Self-pay | Admitting: Oncology

## 2012-02-25 NOTE — Progress Notes (Signed)
Ondansetron 8mg  90 tabs has been approved, 02/24/12-08/26/12, through [employer] [id number].

## 2012-03-01 ENCOUNTER — Ambulatory Visit (HOSPITAL_BASED_OUTPATIENT_CLINIC_OR_DEPARTMENT_OTHER): Payer: PRIVATE HEALTH INSURANCE | Admitting: Physician Assistant

## 2012-03-01 ENCOUNTER — Telehealth: Payer: Self-pay | Admitting: *Deleted

## 2012-03-01 ENCOUNTER — Other Ambulatory Visit: Payer: PRIVATE HEALTH INSURANCE | Admitting: Lab

## 2012-03-01 ENCOUNTER — Encounter: Payer: Self-pay | Admitting: Physician Assistant

## 2012-03-01 VITALS — BP 119/81 | HR 112 | Temp 100.1°F | Ht 68.0 in | Wt 158.9 lb

## 2012-03-01 DIAGNOSIS — D696 Thrombocytopenia, unspecified: Secondary | ICD-10-CM

## 2012-03-01 DIAGNOSIS — C50519 Malignant neoplasm of lower-outer quadrant of unspecified female breast: Secondary | ICD-10-CM

## 2012-03-01 DIAGNOSIS — T50904A Poisoning by unspecified drugs, medicaments and biological substances, undetermined, initial encounter: Secondary | ICD-10-CM

## 2012-03-01 DIAGNOSIS — C50912 Malignant neoplasm of unspecified site of left female breast: Secondary | ICD-10-CM

## 2012-03-01 DIAGNOSIS — D702 Other drug-induced agranulocytosis: Secondary | ICD-10-CM

## 2012-03-01 LAB — CBC WITH DIFFERENTIAL/PLATELET
Basophils Absolute: 0 10*3/uL (ref 0.0–0.1)
EOS%: 14.4 % — ABNORMAL HIGH (ref 0.0–7.0)
Eosinophils Absolute: 0.1 10*3/uL (ref 0.0–0.5)
HCT: 42.2 % (ref 34.8–46.6)
HGB: 14.3 g/dL (ref 11.6–15.9)
LYMPH%: 76.8 % — ABNORMAL HIGH (ref 14.0–49.7)
MCH: 32 pg (ref 25.1–34.0)
MCV: 94.8 fL (ref 79.5–101.0)
MONO%: 3.3 % (ref 0.0–14.0)
NEUT#: 0 10*3/uL — CL (ref 1.5–6.5)
NEUT%: 5 % — ABNORMAL LOW (ref 38.4–76.8)
Platelets: 31 10*3/uL — ABNORMAL LOW (ref 145–400)
RDW: 13.1 % (ref 11.2–14.5)

## 2012-03-01 LAB — COMPREHENSIVE METABOLIC PANEL
AST: 10 U/L (ref 0–37)
Albumin: 4 g/dL (ref 3.5–5.2)
Alkaline Phosphatase: 74 U/L (ref 39–117)
BUN: 13 mg/dL (ref 6–23)
Creatinine, Ser: 0.68 mg/dL (ref 0.50–1.10)
Glucose, Bld: 96 mg/dL (ref 70–99)
Potassium: 4.3 mEq/L (ref 3.5–5.3)
Total Bilirubin: 0.8 mg/dL (ref 0.3–1.2)

## 2012-03-01 NOTE — Patient Instructions (Signed)
1.) We are going to have you start Cipro 500mg  twice a day, please take through 03/05/2012 in the am  2.) Back off dairy until 03/05/12 (stuff with active cultures)  3.) Moniter temperature please--call if >100.5   4.) You may restart Claritin if bony pain should start, also may use Advil 200-400mg  every 6-8h if needed OR  Aleve, 1 tablet every 12hrs.

## 2012-03-01 NOTE — Telephone Encounter (Signed)
gave patient appointment for [phone] 04-2012 05-2012 printed out calendar and gave to the patient

## 2012-03-01 NOTE — Telephone Encounter (Signed)
na

## 2012-03-01 NOTE — Progress Notes (Signed)
Hematology and Oncology Follow Up Visit  Sydney Cervantes [id number] 1960-12-14 51 y.o. 03/01/2012    HPI: Sydney Cervantes is a 51 year old [place] woman with a locally advanced, clinical stage T4, N3, infiltrating lobular carcinoma of the left breast with lymph node involvement and diffuse skin thickening. ER/PR positive at 63/5% respectively, HER-2 negative, elevated Ki-67. Currently day 7 cycle one of 4 planned neoadjuvant dose dense FEC with Neulasta support on day 2.  Interim History:    Sydney Cervantes is seen today with her husband in accompaniment for followup after her first of 4 planned neoadjuvant dose dense FEC. She did receive Neulasta for granulocyte support on day 2. She really is feeling quite well, denying any unexplained fatigue, she denies nausea or emesis. She has had some low-grade diarrhea more like "loose" stools and he is utilizing Imodium effectively. She denies constipation. She is know to have a low-grade temp of 100.1 today, but she was unaware of any apparent fever earlier than this. She denies any respiratory congestion i.e. shortness of breath productive cough no chest pain. She is aware that there is significant softening of her left breast mass, she states that it is not as "tight". She has occasional "tinges" within the breast, but she denies any frank left breast pain. She is unaware of any left arm lymphedema. Her right anterior chest wall port site is nontender. She denies any mouth sores. She denies bleeding or bruising symptoms.  A detailed review of systems is otherwise noncontributory as noted below.  Review of Systems: Constitutional:  no weight loss, fever, night sweats and feels well Eyes: uses glasses ENT: no complaints Cardiovascular: no chest pain or dyspnea on exertion Respiratory: no cough, shortness of breath, or wheezing Neurological: no TIA or stroke symptoms Dermatological: negative Gastrointestinal: no abdominal pain, change in bowel habits,  or black or bloody stools Genito-Urinary: no dysuria, trouble voiding, or hematuria Hematological and Lymphatic: negative Breast: positive for - known L breast mass Musculoskeletal: negative Remaining ROS negative.   Medications:   I have reviewed the patient's current medications.  Current Outpatient Prescriptions  Medication Sig Dispense Refill  . acetaminophen (TYLENOL) 500 MG tablet Take 500 mg by mouth every 6 (six) hours as needed. For pain/headaches.      . dexamethasone (DECADRON) 4 MG tablet Take 2 tablets by mouth once a day on the day after chemotherapy and then take 2 tablets two times a day for 2 days. Take with food.  30 tablet  1  . lidocaine-prilocaine (EMLA) cream Apply topically as needed.  30 g  1  . LORazepam (ATIVAN) 0.5 MG tablet Take 1 tablet (0.5 mg total) by mouth every 6 (six) hours as needed (Nausea or vomiting).  30 tablet  0  . ondansetron (ZOFRAN) 8 MG tablet Take 1 tablet two times a day as needed for nausea or vomiting starting on the third day after chemotherapy.  30 tablet  1  . prochlorperazine (COMPAZINE) 10 MG tablet Take 1 tablet (10 mg total) by mouth every 6 (six) hours as needed (Nausea or vomiting).  30 tablet  1  . prochlorperazine (COMPAZINE) 25 MG suppository Place 1 suppository (25 mg total) rectally every 12 (twelve) hours as needed for nausea.  12 suppository  3   No current facility-administered medications for this visit.   Facility-Administered Medications Ordered in Other Visits  Medication Dose Route Frequency Provider Last Rate Last Dose  . lidocaine-prilocaine (EMLA) cream   Topical Once Pierce Crane, MD  Allergies:  Allergies  Allergen Reactions  . Adhesive (Tape) Itching and Rash    Rash and itching at steristrip and EKG lead pad sites    Physical Exam: Filed Vitals:   03/01/12 1309  BP: 119/81  Pulse: 112  Temp: 100.1 F (37.8 C)    Body mass index is 24.16 kg/(m^2). Weight: 158 lbs. HEENT:  Sclerae anicteric,  conjunctivae pink.  Oropharynx clear.  No mucositis or candidiasis.   Nodes:  No cervical, supraclavicular, or axillary lymphadenopathy palpated.  Breast Exam:  Deferred.   Lungs:  Clear to auscultation bilaterally.  No crackles, rhonchi, or wheezes.   Heart:  Regular rate and rhythm.   Abdomen:  Soft, nontender.  Positive bowel sounds.  No organomegaly or masses palpated.   Musculoskeletal:  No focal spinal tenderness to palpation.  Extremities:  Benign.  No peripheral edema or cyanosis.   Skin:  Benign.   Neuro:  Nonfocal, alert and oriented x 3.   Lab Results: Lab Results  Component Value Date   WBC 0.7* 03/01/2012   HGB 14.3 03/01/2012   HCT 42.2 03/01/2012   MCV 94.8 03/01/2012   PLT 31* 03/01/2012   NEUTROABS 0.0* 03/01/2012     Chemistry      Component Value Date/Time   NA 139 02/19/2012 1003   K 3.7 02/19/2012 1003   CL 104 02/19/2012 1003   CO2 25 02/19/2012 1003   BUN 13 02/19/2012 1003   CREATININE 0.73 02/19/2012 1003      Component Value Date/Time   CALCIUM 9.0 02/19/2012 1003   ALKPHOS 57 02/19/2012 1003   AST 28 02/19/2012 1003   ALT 31 02/19/2012 1003   BILITOT 0.4 02/19/2012 1003      Lab Results  Component Value Date   LABCA2 65* 02/03/2012    Radiological Studies: Nm Pet Image Initial (pi) Skull Base To Thigh 02/12/2012  *RADIOLOGY REPORT*  Clinical Data:  Initial treatment strategy for breast cancer.  NUCLEAR MEDICINE PET CT INITIAL (PI) SKULL BASE TO THIGH  Technique:  16.6 mCi F-18 FDG was injected intravenously via the right antecubital.  Full-ring PET imaging was performed from the skull base through the mid-thighs 60  minutes after injection.  CT data was obtained and used for attenuation correction and anatomic localization only.  (This was not acquired as a diagnostic CT examination.)  Fasting Blood Glucose:  96  Patient Weight:  165 pounds.  Comparison:  MRI breast 01/22/2012.  Findings: The large left breast masses markedly hypermetabolic with diffuse  increased FDG uptake and SUV max of 16.3.  There are also enlarged hypermetabolic axillary and subpectoral lymph nodes. There is a hypermetabolic left internal mammary lymph node and a metastatic supraclavicular lymph node on the right side at the level of the first anterior rib.  Mild diffuse skin thickening noted over the left breast and diffuse hazy increased FDG activity suggesting lymphatic spread.  No findings for pulmonary metastatic disease, mediastinal or hilar lymphadenopathy.  No metastatic disease is identified in the abdomen or pelvis.  No findings for metastatic bone disease.  No significant CT findings.  There is a simple appearing cyst associated with the left ovary.  Surgical changes are noted from a cholecystectomy and anterior abdominal wall surgery.  IMPRESSION:  1.  Large left hypermetabolic breast mass consistent with known breast cancer. 2.  FDG positive left axillary, left subpectoral, left internal mammary and right supraclavicular lymphadenopathy. 3.  No findings for metastatic disease involving the lungs, abdomen, pelvis or  osseous structures.  Original Report Authenticated By: P. Loralie Champagne, M.D.   Dg Chest Port 1 View 02/11/2012  *RADIOLOGY REPORT*  Clinical Data: 51 year old female status post Port-A-Cath insertion.  PORTABLE CHEST - 1 VIEW  Comparison: 02/10/2012.  Findings: Semi upright AP portable view 1100 hours.  Right chest Port-A-Cath in place. Right subclavian approach catheter tip is at the level of the right atrium.  No pneumothorax.  Slightly lower lung volumes.  The lungs remain clear.  Stable cardiac size and mediastinal contours.  IMPRESSION: Right subclavian approach Port-A-Cath placed as above.  No acute cardiopulmonary abnormality.  Original Report Authenticated By: Harley Hallmark, M.D.   Assessment:  Delsie is a 51 year old [place] woman with a locally advanced, clinical stage T4, N3, infiltrating lobular carcinoma of the left breast with  lymph node involvement and diffuse skin thickening. ER/PR positive at 63/5% respectively, HER-2 negative, elevated Ki-67. Currently day 7 cycle one of 4 planned neoadjuvant dose dense FEC with Neulasta support on day 2. 2. Severe neutropenia with associated low-grade temp. Day 6 following Neulasta injection.  Case reviewed with Dr. Pierce Crane.  Plan:  Despite the low grade fever, Fatime looks quite well, and is clinically stable. This said, she will take Tylenol upon returning home, and initiate Cipro 500 mg by mouth twice a day through 03/05/2012 in the a.m. She will monitor her temperatures closely, to contact us if she should arise above 100.5. I will see her in one week's time, specifically 03/08/2012 for followup exam to include breast exam before day 1 cycle 2 of 4 planned neoadjuvant dose dense FEC. Patient and her husband understand and agree with this plan. A copy of her CBC was provided for her today.   This plan was reviewed with the patient, who voices understanding and agreement.  She knows to call with any changes or problems.    Isabel Freese T, PA-C 03/01/2012

## 2012-03-02 ENCOUNTER — Ambulatory Visit (INDEPENDENT_AMBULATORY_CARE_PROVIDER_SITE_OTHER): Payer: PRIVATE HEALTH INSURANCE | Admitting: Surgery

## 2012-03-02 VITALS — BP 112/80 | HR 72 | Temp 98.6°F | Resp 12 | Ht 68.0 in | Wt 156.5 lb

## 2012-03-02 DIAGNOSIS — C50519 Malignant neoplasm of lower-outer quadrant of unspecified female breast: Secondary | ICD-10-CM

## 2012-03-02 NOTE — Progress Notes (Addendum)
Re:   Sydney Cervantes DOB:   [birth year] MRN:   [id number]  [facility]  ASSESSMENT AND PLAN: 1.  Left breast cancer.  T4, N3, Mx, at 11 o'clock.  [place] - 4.5 cm, MRI - 7.6 cm extending to pectoralis muscle.  Lobular, grade 3.  ER - 63%, PR - 5%, Her@Neu  - neg., Ki67 -   Left axillary node biopsy positive.  Treating oncology - [facility].  She is planning 8 cycles of treatment (FEC) over 16 weeks.  I did do an [place] which is in the chart.  I will see her back in 8 weeks.   2.  Level 1, 2 and 3 nodes positive - by MRI. 3.  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  4.  Power port - 02/11/2012.  Worked well first time. 5.  Will need genetics.  BRCA 1/2 neg.  DN  34/04/2012. 6.  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 7.  Has low grade fever - on Cipro.    REFERRING PHYSICIAN:  Dag Pavic, [facility].  HISTORY OF PRESENT ILLNESS: Sydney Cervantes is a 51 y.o. (DOB: Apr 30, 1961)  whtie female whose primary care physician is Debbora Dus, NP, [facility], and comes to me today for follow up of left breast cancer and power port placement.  She is getting neoadjuvant therapy.  She has had a low grade fever, feels a little run down.  But she thinks the tumor is already shrinking.  She felt a mass in her left breast for the first time in Sept., 2012.  The mass seemed to wax and wane in size.  Her mother had breast cancer in her 37's.  She then had a recurrence.  She is still living.      Past Medical History  Diagnosis Date  . Hx of colonoscopy 2000  . Wears glasses   . Cancer 01/2012    L breast cancer     Current Outpatient Prescriptions  Medication Sig Dispense Refill  . acetaminophen (TYLENOL) 500 MG tablet Take 500 mg by mouth every 6 (six) hours as needed. For pain/headaches.      . dexamethasone (DECADRON) 4 MG tablet Take 2 tablets by mouth once a day on the day after chemotherapy and then take 2 tablets two times a day for 2 days. Take with food.  30 tablet  1  .  lidocaine-prilocaine (EMLA) cream Apply topically as needed.  30 g  1  . LORazepam (ATIVAN) 0.5 MG tablet Take 1 tablet (0.5 mg total) by mouth every 6 (six) hours as needed (Nausea or vomiting).  30 tablet  0  . ondansetron (ZOFRAN) 8 MG tablet Take 1 tablet two times a day as needed for nausea or vomiting starting on the third day after chemotherapy.  30 tablet  1  . prochlorperazine (COMPAZINE) 10 MG tablet Take 1 tablet (10 mg total) by mouth every 6 (six) hours as needed (Nausea or vomiting).  30 tablet  1  . prochlorperazine (COMPAZINE) 25 MG suppository Place 1 suppository (25 mg total) rectally every 12 (twelve) hours as needed for nausea.  12 suppository  3   No current facility-administered medications for this visit.   Facility-Administered Medications Ordered in Other Visits  Medication Dose Route Frequency Provider Last Rate Last Dose  . lidocaine-prilocaine (EMLA) cream   Topical Once Pierce Crane, MD         Allergies  Allergen Reactions  . Adhesive (Tape) Itching and  Rash    Rash and itching at steristrip and EKG lead pad sites    REVIEW OF SYSTEMS:  Gastrointestinal: Lap chole - 2000's by Dr. Carolynne Edouard, Appendiceal carcinoid - 1987, last colonoscopy about 2000.  SOCIAL and FAMILY HISTORY: Married.  Husband, [name], with patient. They have 3 daughters - 22,19, 83. She works for [employer] for [employer].  PHYSICAL EXAM: LMP 01/07/2012  General: WN WF who is alert and generally healthy appearing.  HEENT: Normal. Pupils equal. Good dentition. Neck: Supple. No mass.  No thyroid mass.   Lymph Nodes:  No supraclavicular, cervical, or axillary nodes.  I am not sure I can feel a positive note. Breasts:  Left:  Large left breast mass - 6+cm with dimpling at 5 o'clock position.  While in the office, I did an US of the left breast to try to document the size of the tumor.   Right - unremarkable.  DATA REVIEWED: CXR okay for power port.  Ovidio Kin, MD,   [facility] [address] [place]  [address]   [place], [place]    [address] Phone:  [phone] FAX:  [phone]

## 2012-03-04 ENCOUNTER — Ambulatory Visit: Payer: PRIVATE HEALTH INSURANCE

## 2012-03-08 ENCOUNTER — Other Ambulatory Visit: Payer: PRIVATE HEALTH INSURANCE | Admitting: Lab

## 2012-03-08 ENCOUNTER — Ambulatory Visit: Payer: PRIVATE HEALTH INSURANCE

## 2012-03-08 ENCOUNTER — Ambulatory Visit (HOSPITAL_BASED_OUTPATIENT_CLINIC_OR_DEPARTMENT_OTHER): Payer: PRIVATE HEALTH INSURANCE | Admitting: Lab

## 2012-03-08 ENCOUNTER — Telehealth: Payer: Self-pay | Admitting: *Deleted

## 2012-03-08 ENCOUNTER — Other Ambulatory Visit: Payer: Self-pay | Admitting: *Deleted

## 2012-03-08 ENCOUNTER — Ambulatory Visit (HOSPITAL_BASED_OUTPATIENT_CLINIC_OR_DEPARTMENT_OTHER): Payer: PRIVATE HEALTH INSURANCE

## 2012-03-08 ENCOUNTER — Encounter: Payer: Self-pay | Admitting: Physician Assistant

## 2012-03-08 ENCOUNTER — Ambulatory Visit (HOSPITAL_BASED_OUTPATIENT_CLINIC_OR_DEPARTMENT_OTHER): Payer: PRIVATE HEALTH INSURANCE | Admitting: Physician Assistant

## 2012-03-08 VITALS — BP 116/81 | HR 101 | Temp 98.5°F | Ht 68.0 in | Wt 160.9 lb

## 2012-03-08 DIAGNOSIS — C50519 Malignant neoplasm of lower-outer quadrant of unspecified female breast: Secondary | ICD-10-CM

## 2012-03-08 DIAGNOSIS — Z5111 Encounter for antineoplastic chemotherapy: Secondary | ICD-10-CM

## 2012-03-08 DIAGNOSIS — C50912 Malignant neoplasm of unspecified site of left female breast: Secondary | ICD-10-CM

## 2012-03-08 DIAGNOSIS — Z170 Estrogen receptor positive status [ER+]: Secondary | ICD-10-CM

## 2012-03-08 LAB — COMPREHENSIVE METABOLIC PANEL
Albumin: 3.7 g/dL (ref 3.5–5.2)
BUN: 9 mg/dL (ref 6–23)
CO2: 27 mEq/L (ref 19–32)
Glucose, Bld: 111 mg/dL — ABNORMAL HIGH (ref 70–99)
Potassium: 4 mEq/L (ref 3.5–5.3)
Sodium: 140 mEq/L (ref 135–145)
Total Bilirubin: 0.2 mg/dL — ABNORMAL LOW (ref 0.3–1.2)
Total Protein: 6 g/dL (ref 6.0–8.3)

## 2012-03-08 LAB — CBC WITH DIFFERENTIAL/PLATELET
Basophils Absolute: 0 10*3/uL (ref 0.0–0.1)
Eosinophils Absolute: 0 10*3/uL (ref 0.0–0.5)
HCT: 36.2 % (ref 34.8–46.6)
HGB: 12.2 g/dL (ref 11.6–15.9)
MCH: 30.9 pg (ref 25.1–34.0)
MCV: 91.6 fL (ref 79.5–101.0)
MONO%: 12.5 % (ref 0.0–14.0)
NEUT#: 4.2 10*3/uL (ref 1.5–6.5)
NEUT%: 65.7 % (ref 38.4–76.8)
RDW: 12.4 % (ref 11.2–14.5)

## 2012-03-08 LAB — CANCER ANTIGEN 27.29: CA 27.29: 56 U/mL — ABNORMAL HIGH (ref 0–39)

## 2012-03-08 MED ORDER — SODIUM CHLORIDE 0.9 % IV SOLN
Freq: Once | INTRAVENOUS | Status: DC
Start: 2012-03-08 — End: 2012-03-08

## 2012-03-08 MED ORDER — PALONOSETRON HCL INJECTION 0.25 MG/5ML
0.2500 mg | Freq: Once | INTRAVENOUS | Status: AC
  Administered 2012-03-08: 0.25 mg via INTRAVENOUS

## 2012-03-08 MED ORDER — DEXAMETHASONE SODIUM PHOSPHATE 4 MG/ML IJ SOLN
12.0000 mg | Freq: Once | INTRAMUSCULAR | Status: AC
  Administered 2012-03-08: 12 mg via INTRAVENOUS

## 2012-03-08 MED ORDER — SODIUM CHLORIDE 0.9 % IV SOLN
500.0000 mg/m2 | Freq: Once | INTRAVENOUS | Status: AC
  Administered 2012-03-08: 940 mg via INTRAVENOUS
  Filled 2012-03-08: qty 47

## 2012-03-08 MED ORDER — EPIRUBICIN HCL CHEMO IV INJECTION 200 MG/100ML
100.0000 mg/m2 | Freq: Once | INTRAVENOUS | Status: AC
  Administered 2012-03-08: 190 mg via INTRAVENOUS
  Filled 2012-03-08: qty 95

## 2012-03-08 MED ORDER — FLUOROURACIL CHEMO INJECTION 2.5 GM/50ML
500.0000 mg/m2 | Freq: Once | INTRAVENOUS | Status: AC
  Administered 2012-03-08: 950 mg via INTRAVENOUS
  Filled 2012-03-08: qty 19

## 2012-03-08 MED ORDER — FOSAPREPITANT DIMEGLUMINE INJECTION 150 MG
150.0000 mg | Freq: Once | INTRAVENOUS | Status: AC
  Administered 2012-03-08: 150 mg via INTRAVENOUS
  Filled 2012-03-08: qty 5

## 2012-03-08 NOTE — Progress Notes (Signed)
Hematology and Oncology Follow Up Visit  Sydney Cervantes [id number] 1961-10-11 51 y.o. 03/08/2012    HPI: Sydney Cervantes is a 51 year old [place] woman with a locally advanced, clinical stage T4, N3, infiltrating lobular carcinoma of the left breast with lymph node involvement and diffuse skin thickening. ER/PR positive at 63/5% respectively, HER-2 negative, elevated Ki-67. Due for day 1 cycle 2/4 planned neoadjuvant dose dense FEC with Neulasta support on day 2. 2. History of  severe neutropenia with associated low-grade temp, despite Neulasta, covered with Cipro 500mg  po BID.  Interim History:   Sydney Cervantes is seen today with her husband in accompaniment for followup prior to day 1 cycle 2 of 4 planned neoadjuvant dose dense FEC. She completed her course of ciprofloxacin as outlined on her last office visit 03/01/2012.  She denies fevers, chills, night sweats. No nausea, emesis, diarrhea or constipation issues. Her energy level has recovered nicely and she "feels great". She notes that her left breast is much less tight as prior noted, she also notes that she is able to move her left arm without any discomfort. She denies any left arm lymphedema. No bleeding or bruising symptoms. A detailed review of systems is otherwise noncontributory as noted below.  Review of Systems: Constitutional:  no weight loss, fever, night sweats and feels well Eyes: uses glasses ENT: no complaints Cardiovascular: no chest pain or dyspnea on exertion Respiratory: no cough, shortness of breath, or wheezing Neurological: no TIA or stroke symptoms Dermatological: negative Gastrointestinal: no abdominal pain, change in bowel habits, or black or bloody stools Genito-Urinary: no dysuria, trouble voiding, or hematuria Hematological and Lymphatic: negative Breast: positive for - known L breast mass Musculoskeletal: negative Remaining ROS negative.   Medications:   I have reviewed the patient's current  medications.  Current Outpatient Prescriptions  Medication Sig Dispense Refill  . acetaminophen (TYLENOL) 500 MG tablet Take 500 mg by mouth every 6 (six) hours as needed. For pain/headaches.      . dexamethasone (DECADRON) 4 MG tablet Take 2 tablets by mouth once a day on the day after chemotherapy and then take 2 tablets two times a day for 2 days. Take with food.  30 tablet  1  . lidocaine-prilocaine (EMLA) cream Apply topically as needed.  30 g  1  . LORazepam (ATIVAN) 0.5 MG tablet Take 1 tablet (0.5 mg total) by mouth every 6 (six) hours as needed (Nausea or vomiting).  30 tablet  0  . ondansetron (ZOFRAN) 8 MG tablet Take 1 tablet two times a day as needed for nausea or vomiting starting on the third day after chemotherapy.  30 tablet  1  . prochlorperazine (COMPAZINE) 10 MG tablet Take 1 tablet (10 mg total) by mouth every 6 (six) hours as needed (Nausea or vomiting).  30 tablet  1  . prochlorperazine (COMPAZINE) 25 MG suppository Place 1 suppository (25 mg total) rectally every 12 (twelve) hours as needed for nausea.  12 suppository  3   No current facility-administered medications for this visit.   Facility-Administered Medications Ordered in Other Visits  Medication Dose Route Frequency Provider Last Rate Last Dose  . lidocaine-prilocaine (EMLA) cream   Topical Once Pierce Crane, MD        Allergies:  Allergies  Allergen Reactions  . Adhesive (Tape) Itching and Rash    Rash and itching at steristrip and EKG lead pad sites    Physical Exam: Filed Vitals:   03/08/12 0944  BP: 116/81  Pulse: 101  Temp: 98.5 F (36.9 C)    Body mass index is 24.46 kg/(m^2). Weight: 160 lbs. HEENT:  Sclerae anicteric, conjunctivae pink.  Oropharynx clear.  No mucositis or candidiasis.   Nodes:  No cervical, supraclavicular, or axillary lymphadenopathy palpated.  Breast Exam: The left breast was examined, it is still quite tight there is minimal erythema. There is still a bit of induration in  the inferior aspect. No frank palpable lymphadenopathy is noted at this time.  Lungs:  Clear to auscultation bilaterally.  No crackles, rhonchi, or wheezes.   Heart:  Regular rate and rhythm.   Abdomen:  Soft, nontender.  Positive bowel sounds.  No organomegaly or masses palpated.   Musculoskeletal:  No focal spinal tenderness to palpation.  Extremities:  Benign.  No peripheral edema or cyanosis.   Skin:  Benign.   Neuro:  Nonfocal, alert and oriented x 3.   Lab Results: Lab Results  Component Value Date   WBC 6.3 03/08/2012   HGB 12.2 03/08/2012   HCT 36.2 03/08/2012   MCV 91.6 03/08/2012   PLT 205 03/08/2012   NEUTROABS 4.2 03/08/2012     Chemistry      Component Value Date/Time   NA 138 03/01/2012 1249   K 4.3 03/01/2012 1249   CL 102 03/01/2012 1249   CO2 29 03/01/2012 1249   BUN 13 03/01/2012 1249   CREATININE 0.68 03/01/2012 1249      Component Value Date/Time   CALCIUM 9.2 03/01/2012 1249   ALKPHOS 74 03/01/2012 1249   AST 10 03/01/2012 1249   ALT 9 03/01/2012 1249   BILITOT 0.8 03/01/2012 1249      Lab Results  Component Value Date   LABCA2 65* 02/03/2012    Radiological Studies: Nm Pet Image Initial (pi) Skull Base To Thigh 02/12/2012  *RADIOLOGY REPORT*  Clinical Data:  Initial treatment strategy for breast cancer.  NUCLEAR MEDICINE PET CT INITIAL (PI) SKULL BASE TO THIGH  Technique:  16.6 mCi F-18 FDG was injected intravenously via the right antecubital.  Full-ring PET imaging was performed from the skull base through the mid-thighs 60  minutes after injection.  CT data was obtained and used for attenuation correction and anatomic localization only.  (This was not acquired as a diagnostic CT examination.)  Fasting Blood Glucose:  96  Patient Weight:  165 pounds.  Comparison:  MRI breast 01/22/2012.  Findings: The large left breast masses markedly hypermetabolic with diffuse increased FDG uptake and SUV max of 16.3.  There are also enlarged hypermetabolic axillary and subpectoral lymph  nodes. There is a hypermetabolic left internal mammary lymph node and a metastatic supraclavicular lymph node on the right side at the level of the first anterior rib.  Mild diffuse skin thickening noted over the left breast and diffuse hazy increased FDG activity suggesting lymphatic spread.  No findings for pulmonary metastatic disease, mediastinal or hilar lymphadenopathy.  No metastatic disease is identified in the abdomen or pelvis.  No findings for metastatic bone disease.  No significant CT findings.  There is a simple appearing cyst associated with the left ovary.  Surgical changes are noted from a cholecystectomy and anterior abdominal wall surgery.  IMPRESSION:  1.  Large left hypermetabolic breast mass consistent with known breast cancer. 2.  FDG positive left axillary, left subpectoral, left internal mammary and right supraclavicular lymphadenopathy. 3.  No findings for metastatic disease involving the lungs, abdomen, pelvis or osseous structures.  Original Report Authenticated By: P. Loralie Champagne, M.D.  Dg Chest Port 1 View 02/11/2012  *RADIOLOGY REPORT*  Clinical Data: 52 year old female status post Port-A-Cath insertion.  PORTABLE CHEST - 1 VIEW  Comparison: 02/10/2012.  Findings: Semi upright AP portable view 1100 hours.  Right chest Port-A-Cath in place. Right subclavian approach catheter tip is at the level of the right atrium.  No pneumothorax.  Slightly lower lung volumes.  The lungs remain clear.  Stable cardiac size and mediastinal contours.  IMPRESSION: Right subclavian approach Port-A-Cath placed as above.  No acute cardiopulmonary abnormality.  Original Report Authenticated By: Harley Hallmark, M.D.   Assessment:  Sydney Cervantes is a 51 year old [place] woman with a locally advanced, clinical stage T4, N3, infiltrating lobular carcinoma of the left breast with lymph node involvement and diffuse skin thickening. ER/PR positive at 63/5% respectively, HER-2 negative, elevated  Ki-67. Due for day 1 cycle 2/4 planned neoadjuvant dose dense FEC with Neulasta support on day 2. 2. History of  severe neutropenia with associated low-grade temp, despite Neulasta, covered with Cipro 500mg  po BID, with normalization of WBC.  Case reviewed with Dr. Pierce Crane.  Plan:  Natonya will receive day 1 cycle 2 of 4 planned neoadjuvant dose dense FEC today as scheduled. She will return tomorrow for Neulasta support. She will initiate Cipro 500 mg by mouth twice a day on 03/13/2012, I will see her on 03/15/2012 for nadir assessment. She knows to contact us prior if the need should arise. This plan was reviewed with the patient, who voices understanding and agreement.  She knows to call with any changes or problems.    Brett Darko T, PA-C 03/08/2012

## 2012-03-08 NOTE — Telephone Encounter (Signed)
gave patient appointment for 03-15-2012 printed out calendar and gave to the patient

## 2012-03-08 NOTE — Patient Instructions (Signed)
1. Start Cipro 500mg  twice a day on 03/13/12--take until I see you on 03/15/12, we will decide stop date at that appt.

## 2012-03-09 ENCOUNTER — Ambulatory Visit (HOSPITAL_BASED_OUTPATIENT_CLINIC_OR_DEPARTMENT_OTHER): Payer: PRIVATE HEALTH INSURANCE

## 2012-03-09 VITALS — BP 121/80 | HR 116 | Temp 97.0°F

## 2012-03-09 DIAGNOSIS — Z5189 Encounter for other specified aftercare: Secondary | ICD-10-CM

## 2012-03-09 DIAGNOSIS — C50519 Malignant neoplasm of lower-outer quadrant of unspecified female breast: Secondary | ICD-10-CM

## 2012-03-09 MED ORDER — PEGFILGRASTIM INJECTION 6 MG/0.6ML
6.0000 mg | Freq: Once | SUBCUTANEOUS | Status: AC
  Administered 2012-03-09: 6 mg via SUBCUTANEOUS
  Filled 2012-03-09: qty 0.6

## 2012-03-10 ENCOUNTER — Telehealth: Payer: Self-pay | Admitting: Genetic Counselor

## 2012-03-10 ENCOUNTER — Encounter: Payer: Self-pay | Admitting: Genetic Counselor

## 2012-03-10 NOTE — Telephone Encounter (Signed)
Left message that we had good news on her test results.

## 2012-03-13 ENCOUNTER — Other Ambulatory Visit: Payer: Self-pay | Admitting: Oncology

## 2012-03-14 ENCOUNTER — Ambulatory Visit: Payer: PRIVATE HEALTH INSURANCE | Admitting: Physician Assistant

## 2012-03-14 ENCOUNTER — Telehealth: Payer: Self-pay | Admitting: *Deleted

## 2012-03-14 NOTE — Telephone Encounter (Signed)
Confirmed appt with pt for lab/Chris on 03/15/12.  Gave pt date and time.

## 2012-03-15 ENCOUNTER — Ambulatory Visit (HOSPITAL_BASED_OUTPATIENT_CLINIC_OR_DEPARTMENT_OTHER): Payer: PRIVATE HEALTH INSURANCE | Admitting: Physician Assistant

## 2012-03-15 ENCOUNTER — Other Ambulatory Visit (HOSPITAL_BASED_OUTPATIENT_CLINIC_OR_DEPARTMENT_OTHER): Payer: PRIVATE HEALTH INSURANCE | Admitting: Lab

## 2012-03-15 VITALS — BP 117/78 | HR 134 | Temp 98.7°F | Ht 68.0 in | Wt 156.7 lb

## 2012-03-15 DIAGNOSIS — C50919 Malignant neoplasm of unspecified site of unspecified female breast: Secondary | ICD-10-CM

## 2012-03-15 DIAGNOSIS — D709 Neutropenia, unspecified: Secondary | ICD-10-CM

## 2012-03-15 DIAGNOSIS — C778 Secondary and unspecified malignant neoplasm of lymph nodes of multiple regions: Secondary | ICD-10-CM

## 2012-03-15 DIAGNOSIS — C50519 Malignant neoplasm of lower-outer quadrant of unspecified female breast: Secondary | ICD-10-CM

## 2012-03-15 DIAGNOSIS — R5081 Fever presenting with conditions classified elsewhere: Secondary | ICD-10-CM

## 2012-03-15 LAB — CBC WITH DIFFERENTIAL/PLATELET
BASO%: 0 % (ref 0.0–2.0)
EOS%: 1.6 % (ref 0.0–7.0)
HCT: 35.7 % (ref 34.8–46.6)
LYMPH%: 82.4 % — ABNORMAL HIGH (ref 14.0–49.7)
MCH: 32.2 pg (ref 25.1–34.0)
MCHC: 34.8 g/dL (ref 31.5–36.0)
NEUT%: 10.4 % — ABNORMAL LOW (ref 38.4–76.8)
RBC: 3.85 10*6/uL (ref 3.70–5.45)
lymph#: 0.4 10*3/uL — ABNORMAL LOW (ref 0.9–3.3)

## 2012-03-16 ENCOUNTER — Encounter: Payer: Self-pay | Admitting: *Deleted

## 2012-03-16 ENCOUNTER — Ambulatory Visit (HOSPITAL_BASED_OUTPATIENT_CLINIC_OR_DEPARTMENT_OTHER): Payer: PRIVATE HEALTH INSURANCE | Admitting: Physician Assistant

## 2012-03-16 ENCOUNTER — Telehealth: Payer: Self-pay | Admitting: *Deleted

## 2012-03-16 ENCOUNTER — Other Ambulatory Visit: Payer: Self-pay | Admitting: Physician Assistant

## 2012-03-16 ENCOUNTER — Other Ambulatory Visit: Payer: Self-pay | Admitting: *Deleted

## 2012-03-16 ENCOUNTER — Ambulatory Visit (HOSPITAL_BASED_OUTPATIENT_CLINIC_OR_DEPARTMENT_OTHER): Payer: PRIVATE HEALTH INSURANCE | Admitting: Lab

## 2012-03-16 ENCOUNTER — Ambulatory Visit (HOSPITAL_COMMUNITY)
Admission: RE | Admit: 2012-03-16 | Discharge: 2012-03-16 | Disposition: A | Discharge status: Home / Self Care | Payer: PRIVATE HEALTH INSURANCE | Source: Ambulatory Visit | Attending: Physician Assistant | Admitting: Physician Assistant

## 2012-03-16 DIAGNOSIS — D709 Neutropenia, unspecified: Secondary | ICD-10-CM

## 2012-03-16 DIAGNOSIS — C50519 Malignant neoplasm of lower-outer quadrant of unspecified female breast: Secondary | ICD-10-CM

## 2012-03-16 DIAGNOSIS — R5081 Fever presenting with conditions classified elsewhere: Secondary | ICD-10-CM

## 2012-03-16 DIAGNOSIS — R509 Fever, unspecified: Secondary | ICD-10-CM

## 2012-03-16 LAB — CBC WITH DIFFERENTIAL/PLATELET
EOS%: 1.5 % (ref 0.0–7.0)
MCH: 30.9 pg (ref 25.1–34.0)
MCV: 89.6 fL (ref 79.5–101.0)
MONO%: 13.4 % (ref 0.0–14.0)
NEUT#: 0.2 10*3/uL — CL (ref 1.5–6.5)
RBC: 3.75 10*6/uL (ref 3.70–5.45)
RDW: 12.7 % (ref 11.2–14.5)
nRBC: 0 % (ref 0–0)

## 2012-03-16 LAB — URINALYSIS, MICROSCOPIC - CHCC
Bilirubin (Urine): NEGATIVE
Blood: NEGATIVE
Leukocyte Esterase: NEGATIVE
pH: 8 (ref 4.6–8.0)

## 2012-03-16 MED ORDER — DEXTROSE 5 % IV SOLN
2.0000 g | INTRAVENOUS | Status: DC
  Filled 2012-03-16: qty 2

## 2012-03-16 MED ORDER — DEXTROSE 5 % IV SOLN: 2.0000 g | INTRAVENOUS | Status: DC

## 2012-03-16 NOTE — Progress Notes (Signed)
Addended by: Sharyl Nimrod T on: 03/16/2012 02:45 PM   Modules accepted: Orders

## 2012-03-16 NOTE — Telephone Encounter (Signed)
per nurse victoria placed patient of the lab on schedule

## 2012-03-16 NOTE — Telephone Encounter (Signed)
added 03-17-2012 and 03-18-2012 to the patient's schedule printed out calendar and gave to the patient

## 2012-03-16 NOTE — Progress Notes (Signed)
Hematology and Oncology Follow Up Visit  Sydney Cervantes [id number] 24-Oct-1961 51 y.o. 03/15/12   HPI: Sydney Cervantes is a 51 year old [place] woman with a locally advanced, clinical stage T4, N3, infiltrating lobular carcinoma of the left breast with lymph node involvement and diffuse skin thickening. ER/PR positive at 63/5% respectively, HER-2 negative, elevated Ki-67. Currently day 7 cycle 2/4 planned neoadjuvant dose dense FEC with Neulasta support on day 2.  Interim History:   Sydney Cervantes is seen today in company for followup after her second of 4 planned cycles of neoadjuvant dose dense FEC with Neulasta support on day 2. She is on Cipro 500 mg by mouth twice a day which was initiated on 03/12/2012 due to her history of severe neutropenia despite Neulasta. She does report a temp of 101 earlier today, she took Tylenol, and is noted to be afebrile currently. She "feels fine". She denies any shortness of breath or chest pain. No nausea, emesis, diarrhea or constipation issues. She denies any significant left breast tenderness. She denies any dysuria symptoms. No diarrhea or constipation issues. No diffuse bone pain. A detailed review of systems is otherwise noncontributory as noted below.  Review of Systems: Constitutional:  no weight loss, fever, night sweats and feels well Eyes: uses glasses ENT: no complaints Cardiovascular: no chest pain or dyspnea on exertion Respiratory: no cough, shortness of breath, or wheezing Neurological: no TIA or stroke symptoms Dermatological: negative Gastrointestinal: no abdominal pain, change in bowel habits, or black or bloody stools Genito-Urinary: no dysuria, trouble voiding, or hematuria Hematological and Lymphatic: negative Breast: positive for - known L breast mass Musculoskeletal: negative Remaining ROS negative.   Medications:   I have reviewed the patient's current medications.  Current Outpatient Prescriptions  Medication Sig Dispense  Refill  . acetaminophen (TYLENOL) 500 MG tablet Take 500 mg by mouth every 6 (six) hours as needed. For pain/headaches.      . dexamethasone (DECADRON) 4 MG tablet Take 2 tablets by mouth once a day on the day after chemotherapy and then take 2 tablets two times a day for 2 days. Take with food.  30 tablet  1  . lidocaine-prilocaine (EMLA) cream Apply topically as needed.  30 g  1  . LORazepam (ATIVAN) 0.5 MG tablet Take 1 tablet (0.5 mg total) by mouth every 6 (six) hours as needed (Nausea or vomiting).  30 tablet  0  . ondansetron (ZOFRAN) 8 MG tablet Take 1 tablet two times a day as needed for nausea or vomiting starting on the third day after chemotherapy.  30 tablet  1  . prochlorperazine (COMPAZINE) 10 MG tablet Take 1 tablet (10 mg total) by mouth every 6 (six) hours as needed (Nausea or vomiting).  30 tablet  1  . prochlorperazine (COMPAZINE) 25 MG suppository Place 1 suppository (25 mg total) rectally every 12 (twelve) hours as needed for nausea.  12 suppository  3   No current facility-administered medications for this visit.   Facility-Administered Medications Ordered in Other Visits  Medication Dose Route Frequency Provider Last Rate Last Dose  . lidocaine-prilocaine (EMLA) cream   Topical Once Pierce Crane, MD        Allergies:  Allergies  Allergen Reactions  . Adhesive (Tape) Itching and Rash    Rash and itching at steristrip and EKG lead pad sites    Physical Exam: Filed Vitals:   03/15/12 1438  BP: 117/78  Pulse: 134  Temp: 98.7 F (37.1 C)    Body mass index  is 23.83 kg/(m^2). Weight: 156 lbs. HEENT:  Sclerae anicteric, conjunctivae pink.  Oropharynx clear.  No mucositis or candidiasis.   Nodes:  No cervical, supraclavicular, or axillary lymphadenopathy palpated.  Breast Exam:  Deferred.   Lungs:  Clear to auscultation bilaterally.  No crackles, rhonchi, or wheezes.   Heart:  Regular rate and rhythm.   Abdomen:  Soft, nontender.  Positive bowel sounds.  No  organomegaly or masses palpated.   Musculoskeletal:  No focal spinal tenderness to palpation.  Extremities:  Benign.  No peripheral edema or cyanosis.   Skin:  Benign.   Neuro:  Nonfocal, alert and oriented x 3.   Lab Results: Lab Results  Component Value Date   WBC 0.7* 03/16/2012   HGB 11.6 03/16/2012   HCT 33.6* 03/16/2012   MCV 89.6 03/16/2012   PLT 59* 03/16/2012   NEUTROABS 0.2* 03/16/2012     Chemistry      Component Value Date/Time   NA 140 03/08/2012 0932   K 4.0 03/08/2012 0932   CL 105 03/08/2012 0932   CO2 27 03/08/2012 0932   BUN 9 03/08/2012 0932   CREATININE 0.69 03/08/2012 0932      Component Value Date/Time   CALCIUM 9.0 03/08/2012 0932   ALKPHOS 78 03/08/2012 0932   AST 16 03/08/2012 0932   ALT 14 03/08/2012 0932   BILITOT 0.2* 03/08/2012 0932      Lab Results  Component Value Date   LABCA2 56* 03/08/2012    Radiological Studies: Nm Pet Image Initial (pi) Skull Base To Thigh 02/12/2012  *RADIOLOGY REPORT*  Clinical Data:  Initial treatment strategy for breast cancer.  NUCLEAR MEDICINE PET CT INITIAL (PI) SKULL BASE TO THIGH  Technique:  16.6 mCi F-18 FDG was injected intravenously via the right antecubital.  Full-ring PET imaging was performed from the skull base through the mid-thighs 60  minutes after injection.  CT data was obtained and used for attenuation correction and anatomic localization only.  (This was not acquired as a diagnostic CT examination.)  Fasting Blood Glucose:  96  Patient Weight:  165 pounds.  Comparison:  MRI breast 01/22/2012.  Findings: The large left breast masses markedly hypermetabolic with diffuse increased FDG uptake and SUV max of 16.3.  There are also enlarged hypermetabolic axillary and subpectoral lymph nodes. There is a hypermetabolic left internal mammary lymph node and a metastatic supraclavicular lymph node on the right side at the level of the first anterior rib.  Mild diffuse skin thickening noted over the left breast and diffuse hazy increased  FDG activity suggesting lymphatic spread.  No findings for pulmonary metastatic disease, mediastinal or hilar lymphadenopathy.  No metastatic disease is identified in the abdomen or pelvis.  No findings for metastatic bone disease.  No significant CT findings.  There is a simple appearing cyst associated with the left ovary.  Surgical changes are noted from a cholecystectomy and anterior abdominal wall surgery.  IMPRESSION:  1.  Large left hypermetabolic breast mass consistent with known breast cancer. 2.  FDG positive left axillary, left subpectoral, left internal mammary and right supraclavicular lymphadenopathy. 3.  No findings for metastatic disease involving the lungs, abdomen, pelvis or osseous structures.  Original Report Authenticated By: P. Loralie Champagne, M.D.   Dg Chest Port 1 View 02/11/2012  *RADIOLOGY REPORT*  Clinical Data: 51 year old female status post Port-A-Cath insertion.  PORTABLE CHEST - 1 VIEW  Comparison: 02/10/2012.  Findings: Semi upright AP portable view 1100 hours.  Right chest Port-A-Cath in place.  Right subclavian approach catheter tip is at the level of the right atrium.  No pneumothorax.  Slightly lower lung volumes.  The lungs remain clear.  Stable cardiac size and mediastinal contours.  IMPRESSION: Right subclavian approach Port-A-Cath placed as above.  No acute cardiopulmonary abnormality.  Original Report Authenticated By: Harley Hallmark, M.D.   Assessment:  Zaiah is a 51 year old [place] woman with a locally advanced, clinical stage T4, N3, infiltrating lobular carcinoma of the left breast with lymph node involvement and diffuse skin thickening. ER/PR positive at 63/5% respectively, HER-2 negative, elevated Ki-67. Currently day 7 cycle 2/4 planned neoadjuvant dose dense FEC with Neulasta support on day 2. 2. Severe neutropenia with reported low-grade temp. Day 6 following Neulasta injection.  Case reviewed with Dr. Pierce Crane.  Plan:  Hampton looks  quite well, and is clinically stable. This said, she will continue to moniter for fever, and continue Cipro 500 mg by mouth twice a day through 03/19/2012 in the a.m. She will monitor her temperatures closely, to contact us if she should arise above 100.5. I will see her in one week's time, specifically 03/22/2012 for followup exam to include breast exam before day 1 cycle 3 of 4 planned neoadjuvant dose dense FEC.   This plan was reviewed with the patient, who voices understanding and agreement.  She knows to call with any changes or problems.    Tyrick Dunagan T, PA-C 03/15/12

## 2012-03-17 ENCOUNTER — Ambulatory Visit (HOSPITAL_BASED_OUTPATIENT_CLINIC_OR_DEPARTMENT_OTHER): Payer: PRIVATE HEALTH INSURANCE

## 2012-03-17 ENCOUNTER — Other Ambulatory Visit: Payer: Self-pay | Admitting: Oncology

## 2012-03-17 ENCOUNTER — Other Ambulatory Visit: Payer: Self-pay | Admitting: Physician Assistant

## 2012-03-17 ENCOUNTER — Other Ambulatory Visit: Payer: PRIVATE HEALTH INSURANCE | Admitting: Lab

## 2012-03-17 VITALS — BP 105/75 | HR 111 | Temp 98.8°F

## 2012-03-17 DIAGNOSIS — D709 Neutropenia, unspecified: Secondary | ICD-10-CM

## 2012-03-17 DIAGNOSIS — R5081 Fever presenting with conditions classified elsewhere: Secondary | ICD-10-CM

## 2012-03-17 DIAGNOSIS — C50519 Malignant neoplasm of lower-outer quadrant of unspecified female breast: Secondary | ICD-10-CM

## 2012-03-17 DIAGNOSIS — C50919 Malignant neoplasm of unspecified site of unspecified female breast: Secondary | ICD-10-CM

## 2012-03-17 LAB — CBC WITH DIFFERENTIAL/PLATELET
BASO%: 0.8 % (ref 0.0–2.0)
HCT: 33.8 % — ABNORMAL LOW (ref 34.8–46.6)
LYMPH%: 27.7 % (ref 14.0–49.7)
MCH: 30.8 pg (ref 25.1–34.0)
MCHC: 34 g/dL (ref 31.5–36.0)
MONO#: 0.2 10*3/uL (ref 0.1–0.9)
NEUT%: 53.8 % (ref 38.4–76.8)
Platelets: 56 10*3/uL — ABNORMAL LOW (ref 145–400)
WBC: 1.3 10*3/uL — ABNORMAL LOW (ref 3.9–10.3)

## 2012-03-17 MED ORDER — DEXTROSE 5 % IV SOLN
2.0000 g | INTRAVENOUS | Status: DC
  Administered 2012-03-17: 2 g via INTRAVENOUS
  Filled 2012-03-17: qty 2

## 2012-03-17 NOTE — Progress Notes (Signed)
Please see full note dated on 03/16/2012.  Sydney Cervantes contacted our office this morning stating that she had temperature 101.7. She had been taking for Cipro as prescribed, she has not utilized any Tylenol recently. Therefore, we brought her into the office obtained a repeat CBC, blood cultures x2 (one via port, one peripherally), a urinalysis, and chest x-ray. The CBC again revealed the fact that she is still neutropenic. Chest x-ray was clear, urinalysis essentially clear, cultures obviously pending. Her vital signs were essentially stable except for some mild tachycardia, she was noted to have a temperature of 100.2. Her case was reviewed with Dr. Darnelle Catalan in Dr. Renelda Loma absence.  Patient is extremely reluctant for hospitalization, therefore, we initiated Rocephin 2 g IV to be dosed today, 03/17/2012 and 03/18/2012. In addition to his Cipro 500 mg by mouth twice a day, Augmentin 875 one by mouth twice a day was also included. Sydney Cervantes will return on 03/17/2012 for repeat CBC and her next dose of Rocephin as outlined above. We will repeat this on 03/18/2012. The risks were discussed with Sydney Cervantes and she and her husband are fully aware. She understands that if her fever should go back up overnight, she should contact the on-call physician and proceed to the emergency department.

## 2012-03-18 ENCOUNTER — Ambulatory Visit (HOSPITAL_BASED_OUTPATIENT_CLINIC_OR_DEPARTMENT_OTHER): Payer: PRIVATE HEALTH INSURANCE

## 2012-03-18 ENCOUNTER — Other Ambulatory Visit (HOSPITAL_BASED_OUTPATIENT_CLINIC_OR_DEPARTMENT_OTHER): Payer: PRIVATE HEALTH INSURANCE | Admitting: Lab

## 2012-03-18 VITALS — BP 105/79 | HR 109 | Temp 98.1°F

## 2012-03-18 DIAGNOSIS — D709 Neutropenia, unspecified: Secondary | ICD-10-CM

## 2012-03-18 DIAGNOSIS — R5081 Fever presenting with conditions classified elsewhere: Secondary | ICD-10-CM

## 2012-03-18 DIAGNOSIS — C50519 Malignant neoplasm of lower-outer quadrant of unspecified female breast: Secondary | ICD-10-CM

## 2012-03-18 DIAGNOSIS — R000 Tachycardia, unspecified: Secondary | ICD-10-CM

## 2012-03-18 DIAGNOSIS — C50919 Malignant neoplasm of unspecified site of unspecified female breast: Secondary | ICD-10-CM

## 2012-03-18 LAB — CBC WITH DIFFERENTIAL/PLATELET
Basophils Absolute: 0 10*3/uL (ref 0.0–0.1)
Eosinophils Absolute: 0 10*3/uL (ref 0.0–0.5)
HGB: 10.8 g/dL — ABNORMAL LOW (ref 11.6–15.9)
MCV: 90.3 fL (ref 79.5–101.0)
MONO#: 0.5 10*3/uL (ref 0.1–0.9)
MONO%: 13.7 % (ref 0.0–14.0)
NEUT#: 2.4 10*3/uL (ref 1.5–6.5)
RDW: 13 % (ref 11.2–14.5)
WBC: 3.6 10*3/uL — ABNORMAL LOW (ref 3.9–10.3)
lymph#: 0.7 10*3/uL — ABNORMAL LOW (ref 0.9–3.3)

## 2012-03-18 LAB — URINE CULTURE

## 2012-03-18 MED ORDER — CEFTRIAXONE SODIUM 2 G IJ SOLR
2.0000 g | Freq: Once | INTRAMUSCULAR | Status: AC
  Administered 2012-03-18: 2 g via INTRAVENOUS
  Filled 2012-03-18: qty 2

## 2012-03-22 ENCOUNTER — Encounter: Payer: Self-pay | Admitting: Physician Assistant

## 2012-03-22 ENCOUNTER — Ambulatory Visit: Payer: PRIVATE HEALTH INSURANCE | Admitting: Physician Assistant

## 2012-03-22 ENCOUNTER — Other Ambulatory Visit: Payer: Self-pay | Admitting: Oncology

## 2012-03-22 ENCOUNTER — Other Ambulatory Visit (HOSPITAL_BASED_OUTPATIENT_CLINIC_OR_DEPARTMENT_OTHER): Payer: PRIVATE HEALTH INSURANCE

## 2012-03-22 ENCOUNTER — Ambulatory Visit (HOSPITAL_BASED_OUTPATIENT_CLINIC_OR_DEPARTMENT_OTHER): Payer: PRIVATE HEALTH INSURANCE

## 2012-03-22 VITALS — BP 106/70 | HR 108 | Temp 99.0°F | Ht 68.0 in | Wt 159.2 lb

## 2012-03-22 DIAGNOSIS — Z5111 Encounter for antineoplastic chemotherapy: Secondary | ICD-10-CM

## 2012-03-22 DIAGNOSIS — C50519 Malignant neoplasm of lower-outer quadrant of unspecified female breast: Secondary | ICD-10-CM

## 2012-03-22 LAB — CBC WITH DIFFERENTIAL/PLATELET
Basophils Absolute: 0.1 10*3/uL (ref 0.0–0.1)
Eosinophils Absolute: 0 10*3/uL (ref 0.0–0.5)
HCT: 31.7 % — ABNORMAL LOW (ref 34.8–46.6)
HGB: 10.6 g/dL — ABNORMAL LOW (ref 11.6–15.9)
LYMPH%: 18.6 % (ref 14.0–49.7)
MONO#: 0.8 10*3/uL (ref 0.1–0.9)
NEUT#: 3.9 10*3/uL (ref 1.5–6.5)
NEUT%: 67.1 % (ref 38.4–76.8)
Platelets: 166 10*3/uL (ref 145–400)
WBC: 5.9 10*3/uL (ref 3.9–10.3)
lymph#: 1.1 10*3/uL (ref 0.9–3.3)

## 2012-03-22 LAB — COMPREHENSIVE METABOLIC PANEL
Albumin: 3.8 g/dL (ref 3.5–5.2)
BUN: 11 mg/dL (ref 6–23)
CO2: 29 mEq/L (ref 19–32)
Calcium: 8.5 mg/dL (ref 8.4–10.5)
Chloride: 106 mEq/L (ref 96–112)
Creatinine, Ser: 0.59 mg/dL (ref 0.50–1.10)
Potassium: 3.5 mEq/L (ref 3.5–5.3)

## 2012-03-22 LAB — LACTATE DEHYDROGENASE: LDH: 247 U/L (ref 94–250)

## 2012-03-22 MED ORDER — SODIUM CHLORIDE 0.9 % IJ SOLN
10.0000 mL | INTRAMUSCULAR | Status: DC | PRN
  Administered 2012-03-22: 10 mL
  Filled 2012-03-22: qty 10

## 2012-03-22 MED ORDER — SODIUM CHLORIDE 0.9 % IV SOLN: Freq: Once | INTRAVENOUS | Status: DC

## 2012-03-22 MED ORDER — SODIUM CHLORIDE 0.9 % IV SOLN
400.0000 mg/m2 | Freq: Once | INTRAVENOUS | Status: AC
  Administered 2012-03-22: 760 mg via INTRAVENOUS
  Filled 2012-03-22: qty 38

## 2012-03-22 MED ORDER — EPIRUBICIN HCL CHEMO IV INJECTION 200 MG/100ML
75.0000 mg/m2 | Freq: Once | INTRAVENOUS | Status: AC
  Administered 2012-03-22: 142 mg via INTRAVENOUS
  Filled 2012-03-22: qty 71

## 2012-03-22 MED ORDER — DEXAMETHASONE SODIUM PHOSPHATE 4 MG/ML IJ SOLN
12.0000 mg | Freq: Once | INTRAMUSCULAR | Status: AC
  Administered 2012-03-22: 12 mg via INTRAVENOUS

## 2012-03-22 MED ORDER — SODIUM CHLORIDE 0.9 % IV SOLN
150.0000 mg | Freq: Once | INTRAVENOUS | Status: AC
  Administered 2012-03-22: 150 mg via INTRAVENOUS
  Filled 2012-03-22: qty 5

## 2012-03-22 MED ORDER — PALONOSETRON HCL INJECTION 0.25 MG/5ML
0.2500 mg | Freq: Once | INTRAVENOUS | Status: AC
  Administered 2012-03-22: 0.25 mg via INTRAVENOUS

## 2012-03-22 MED ORDER — HEPARIN SOD (PORK) LOCK FLUSH 100 UNIT/ML IV SOLN
500.0000 [IU] | Freq: Once | INTRAVENOUS | Status: AC | PRN
  Administered 2012-03-22: 500 [IU]
  Filled 2012-03-22: qty 5

## 2012-03-22 MED ORDER — FLUOROURACIL CHEMO INJECTION 2.5 GM/50ML
400.0000 mg/m2 | Freq: Once | INTRAVENOUS | Status: AC
  Administered 2012-03-22: 750 mg via INTRAVENOUS
  Filled 2012-03-22: qty 15

## 2012-03-22 NOTE — Progress Notes (Signed)
Hematology and Oncology Follow Up Visit  Sydney Cervantes [id number] 17-Jun-1961 51 y.o. 03/22/2012    HPI: Sydney Cervantes is a 51 year old [place] woman with a locally advanced, clinical stage T4, N3, infiltrating lobular carcinoma of the left breast with lymph node involvement and diffuse skin thickening. ER/PR positive at 63/5% respectively, HER-2 negative, elevated Ki-67. Due for day 1 cycle 3/4 planned neoadjuvant dose dense FEC with Neulasta support on day 2.  2. History of  severe neutropenia with associated fever despite Neulasta, covered with Cipro 500mg  po BID, Augmentin 875mg  po BID, and 3 doses of IV Rocephin 2.0gm 4/10-4/12/13.  Interim History:   Sydney Cervantes is seen today with her husband in accompaniment for followup prior to day 1 cycle 3/4 planned neoadjuvant dose dense FEC. , Patient has a history of severe neutropenia with with associated fever after cycle 2. She was covered in the outpatient setting with oral Cipro 500 mg by mouth twice a day being continued, Augmentin 875 by mouth twice a day and she received 3 doses of IV Rocephin 2 g between 4/10-04/11/2012 given daily. She states last weekend she felt "great". She's had no further fevers, her energy level has recovered nicely. No nausea, or emesis issues. She did not experience any oral thrush or vaginal candidiasis. She denies shortness of breath or chest pain. She has appreciated that the left breast has continued to soften quite a bit. A detailed review of systems is otherwise noncontributory as noted below.  Review of Systems: Constitutional:  no weight loss, fever, night sweats and feels well Eyes: uses glasses ENT: no complaints Cardiovascular: no chest pain or dyspnea on exertion Respiratory: no cough, shortness of breath, or wheezing Neurological: no TIA or stroke symptoms Dermatological: negative Gastrointestinal: no abdominal pain, change in bowel habits, or black or bloody stools Genito-Urinary: no dysuria,  trouble voiding, or hematuria Hematological and Lymphatic: negative Breast: positive for - known L breast mass Musculoskeletal: negative Remaining ROS negative.   Medications:   I have reviewed the patient's current medications.  Current Outpatient Prescriptions  Medication Sig Dispense Refill  . acetaminophen (TYLENOL) 500 MG tablet Take 500 mg by mouth every 6 (six) hours as needed. For pain/headaches.      . dexamethasone (DECADRON) 4 MG tablet Take 2 tablets by mouth once a day on the day after chemotherapy and then take 2 tablets two times a day for 2 days. Take with food.  30 tablet  1  . lidocaine-prilocaine (EMLA) cream Apply topically as needed.  30 g  1  . LORazepam (ATIVAN) 0.5 MG tablet Take 1 tablet (0.5 mg total) by mouth every 6 (six) hours as needed (Nausea or vomiting).  30 tablet  0  . ondansetron (ZOFRAN) 8 MG tablet Take 1 tablet two times a day as needed for nausea or vomiting starting on the third day after chemotherapy.  30 tablet  1  . prochlorperazine (COMPAZINE) 10 MG tablet Take 1 tablet (10 mg total) by mouth every 6 (six) hours as needed (Nausea or vomiting).  30 tablet  1  . prochlorperazine (COMPAZINE) 25 MG suppository Place 1 suppository (25 mg total) rectally every 12 (twelve) hours as needed for nausea.  12 suppository  3   No current facility-administered medications for this visit.   Facility-Administered Medications Ordered in Other Visits  Medication Dose Route Frequency Provider Last Rate Last Dose  . 0.9 %  sodium chloride infusion   Intravenous Once Pierce Crane, MD      .  dexamethasone (DECADRON) injection 12 mg  12 mg Intravenous Once Pierce Crane, MD      . fosaprepitant (EMEND) 150 mg in sodium chloride 0.9 % 145 mL IVPB  150 mg Intravenous Once Pierce Crane, MD      . lidocaine-prilocaine (EMLA) cream   Topical Once Pierce Crane, MD      . palonosetron (ALOXI) injection 0.25 mg  0.25 mg Intravenous Once Pierce Crane, MD        Allergies:    Allergies  Allergen Reactions  . Adhesive (Tape) Itching and Rash    Rash and itching at steristrip and EKG lead pad sites    Physical Exam: Filed Vitals:   03/22/12 1317  BP: 106/70  Pulse: 108  Temp: 99 F (37.2 C)    Body mass index is 24.21 kg/(m^2). Weight: 159 lbs. HEENT:  Sclerae anicteric, conjunctivae pink.  Oropharynx clear.  No mucositis or candidiasis.   Nodes:  No cervical, supraclavicular, or axillary lymphadenopathy palpated.  Breast Exam: The left breast was examined, and the erythema has completely abated. The whole breast is starting to soften, there is a small amount of natural droop present. No frank evidence of palpable lymphadenopathy.  Lungs:  Clear to auscultation bilaterally.  No crackles, rhonchi, or wheezes.   Heart:  Regular rate and rhythm.   Abdomen:  Soft, nontender.  Positive bowel sounds.  No organomegaly or masses palpated.   Musculoskeletal:  No focal spinal tenderness to palpation.  Extremities:  Benign.  No peripheral edema or cyanosis.   Skin:  Benign.   Neuro:  Nonfocal, alert and oriented x 3.   Lab Results: Lab Results  Component Value Date   WBC 5.9 03/22/2012   HGB 10.6* 03/22/2012   HCT 31.7* 03/22/2012   MCV 91.6 03/22/2012   PLT 166 03/22/2012   NEUTROABS 3.9 03/22/2012     Chemistry      Component Value Date/Time   NA 140 03/08/2012 0932   K 4.0 03/08/2012 0932   CL 105 03/08/2012 0932   CO2 27 03/08/2012 0932   BUN 9 03/08/2012 0932   CREATININE 0.69 03/08/2012 0932      Component Value Date/Time   CALCIUM 9.0 03/08/2012 0932   ALKPHOS 78 03/08/2012 0932   AST 16 03/08/2012 0932   ALT 14 03/08/2012 0932   BILITOT 0.2* 03/08/2012 0932      Lab Results  Component Value Date   LABCA2 56* 03/08/2012   Blood cultures X 2 from 03/16/12: (1 set peripheral, 2nd set via PORT) = NO GROWTH.   Radiological Studies: Nm Pet Image Initial (pi) Skull Base To Thigh 02/12/2012  *RADIOLOGY REPORT*  Clinical Data:  Initial treatment strategy for breast  cancer.  NUCLEAR MEDICINE PET CT INITIAL (PI) SKULL BASE TO THIGH  Technique:  16.6 mCi F-18 FDG was injected intravenously via the right antecubital.  Full-ring PET imaging was performed from the skull base through the mid-thighs 60  minutes after injection.  CT data was obtained and used for attenuation correction and anatomic localization only.  (This was not acquired as a diagnostic CT examination.)  Fasting Blood Glucose:  96  Patient Weight:  165 pounds.  Comparison:  MRI breast 01/22/2012.  Findings: The large left breast masses markedly hypermetabolic with diffuse increased FDG uptake and SUV max of 16.3.  There are also enlarged hypermetabolic axillary and subpectoral lymph nodes. There is a hypermetabolic left internal mammary lymph node and a metastatic supraclavicular lymph node on the right side  at the level of the first anterior rib.  Mild diffuse skin thickening noted over the left breast and diffuse hazy increased FDG activity suggesting lymphatic spread.  No findings for pulmonary metastatic disease, mediastinal or hilar lymphadenopathy.  No metastatic disease is identified in the abdomen or pelvis.  No findings for metastatic bone disease.  No significant CT findings.  There is a simple appearing cyst associated with the left ovary.  Surgical changes are noted from a cholecystectomy and anterior abdominal wall surgery.  IMPRESSION:  1.  Large left hypermetabolic breast mass consistent with known breast cancer. 2.  FDG positive left axillary, left subpectoral, left internal mammary and right supraclavicular lymphadenopathy. 3.  No findings for metastatic disease involving the lungs, abdomen, pelvis or osseous structures.  Original Report Authenticated By: P. Loralie Champagne, M.D.   Dg Chest Port 1 View 02/11/2012  *RADIOLOGY REPORT*  Clinical Data: 51 year old female status post Port-A-Cath insertion.  PORTABLE CHEST - 1 VIEW  Comparison: 02/10/2012.  Findings: Semi upright AP portable view 1100  hours.  Right chest Port-A-Cath in place. Right subclavian approach catheter tip is at the level of the right atrium.  No pneumothorax.  Slightly lower lung volumes.  The lungs remain clear.  Stable cardiac size and mediastinal contours.  IMPRESSION: Right subclavian approach Port-A-Cath placed as above.  No acute cardiopulmonary abnormality.  Original Report Authenticated By: Harley Hallmark, M.D.   Assessment:  Sydney Cervantes is a 51 year old [place] woman with a locally advanced, clinical stage T4, N3, infiltrating lobular carcinoma of the left breast with lymph node involvement and diffuse skin thickening. ER/PR positive at 63/5% respectively, HER-2 negative, elevated Ki-67. Due for day 1 cycle 3/4 planned neoadjuvant dose dense FEC with Neulasta support on day 2. Excellent clinical response.  2. History of  severe neutropenia with associated fever, despite Neulasta, covered with Cipro 500mg  po BID, Augmentin 875mg  po BID, and 3 doses IV Rocephin 2.0gm on 4/10-4/12/13, now with normalization of WBC.  Case reviewed with Dr. Pierce Crane, who also examined the patient.  Plan:  Sydney Cervantes will receive day 1 cycle 3/4 planned neoadjuvant dose dense FEC today as scheduled, and 20-25% dose reduction in all 3 agents. She will return tomorrow for Neulasta support. She will initiate Cipro 500 mg by mouth twice a day on 03/26/2012, I will see her on 03/29/2012 for nadir assessment. Dr. Donnie Coffin has also discussed the possibility of extending the course from 4 to 6 cycles of neoadjuvant dose dense FEC.  This plan was reviewed with the patient, who voices understanding and agreement.  She knows to call with any changes or problems.    Sydney Zumstein T, PA-C 03/22/2012

## 2012-03-23 ENCOUNTER — Ambulatory Visit (HOSPITAL_BASED_OUTPATIENT_CLINIC_OR_DEPARTMENT_OTHER): Payer: PRIVATE HEALTH INSURANCE

## 2012-03-23 VITALS — BP 119/80 | HR 108 | Temp 97.7°F

## 2012-03-23 DIAGNOSIS — C50519 Malignant neoplasm of lower-outer quadrant of unspecified female breast: Secondary | ICD-10-CM

## 2012-03-23 DIAGNOSIS — Z5189 Encounter for other specified aftercare: Secondary | ICD-10-CM

## 2012-03-23 MED ORDER — PEGFILGRASTIM INJECTION 6 MG/0.6ML
6.0000 mg | Freq: Once | SUBCUTANEOUS | Status: AC
  Administered 2012-03-23: 6 mg via SUBCUTANEOUS
  Filled 2012-03-23: qty 0.6

## 2012-03-29 ENCOUNTER — Encounter: Payer: Self-pay | Admitting: *Deleted

## 2012-03-29 ENCOUNTER — Encounter: Payer: Self-pay | Admitting: Physician Assistant

## 2012-03-29 ENCOUNTER — Ambulatory Visit (HOSPITAL_BASED_OUTPATIENT_CLINIC_OR_DEPARTMENT_OTHER): Payer: PRIVATE HEALTH INSURANCE | Admitting: Physician Assistant

## 2012-03-29 ENCOUNTER — Other Ambulatory Visit (HOSPITAL_BASED_OUTPATIENT_CLINIC_OR_DEPARTMENT_OTHER): Payer: PRIVATE HEALTH INSURANCE | Admitting: Lab

## 2012-03-29 ENCOUNTER — Other Ambulatory Visit: Payer: Self-pay | Admitting: Physician Assistant

## 2012-03-29 VITALS — BP 94/67 | HR 112 | Temp 98.7°F | Ht 68.0 in | Wt 157.3 lb

## 2012-03-29 DIAGNOSIS — C50919 Malignant neoplasm of unspecified site of unspecified female breast: Secondary | ICD-10-CM

## 2012-03-29 DIAGNOSIS — C50519 Malignant neoplasm of lower-outer quadrant of unspecified female breast: Secondary | ICD-10-CM

## 2012-03-29 DIAGNOSIS — C773 Secondary and unspecified malignant neoplasm of axilla and upper limb lymph nodes: Secondary | ICD-10-CM

## 2012-03-29 DIAGNOSIS — D709 Neutropenia, unspecified: Secondary | ICD-10-CM

## 2012-03-29 DIAGNOSIS — C50912 Malignant neoplasm of unspecified site of left female breast: Secondary | ICD-10-CM

## 2012-03-29 LAB — CBC WITH DIFFERENTIAL/PLATELET
BASO%: 1.4 % (ref 0.0–2.0)
EOS%: 2.7 % (ref 0.0–7.0)
LYMPH%: 45.1 % (ref 14.0–49.7)
MCH: 32 pg (ref 25.1–34.0)
MCHC: 34.6 g/dL (ref 31.5–36.0)
MCV: 92.4 fL (ref 79.5–101.0)
MONO%: 17.5 % — ABNORMAL HIGH (ref 0.0–14.0)
Platelets: 57 10*3/uL — ABNORMAL LOW (ref 145–400)
RBC: 3.27 10*6/uL — ABNORMAL LOW (ref 3.70–5.45)
WBC: 0.8 10*3/uL — CL (ref 3.9–10.3)
nRBC: 1 % — ABNORMAL HIGH (ref 0–0)

## 2012-03-29 NOTE — Progress Notes (Signed)
Hematology and Oncology Follow Up Visit  Sydney Cervantes [id number] 10-Jul-1961 50 y.o. 03/29/2012    HPI: Sydney Cervantes is a 51 year old [place] woman with a locally advanced, clinical stage T4, N3, infiltrating lobular carcinoma of the left breast with lymph node involvement and diffuse skin thickening. ER/PR positive at 63/5% respectively, HER-2 negative, elevated Ki-67. Currently day 7 cycle 3/4 planned neoadjuvant dose dense FEC with Neulasta support on day 2, all three agents given at a 20% dose reduction starting with cycle 3 due to #2, excellent clinical response.  2. History of  severe neutropenia with associated fever despite Neulasta, covered with Cipro 500mg  po BID, Augmentin 875mg  po BID, and 3 doses of IV Rocephin 2.0gm 4/10-4/12/13.  Interim History:   Sydney Cervantes is seen today with her husband in accompaniment for followup after cycle 3 of 6 planned neoadjuvant dose dense FEC, cycle 3 given at a 20% dose reduction due to her history of severe neutropenia with associated fevers. She is on Cipro 500 mg by mouth twice a day since 03/26/2012. She really feels quite well, she had a low-grade temp of 99.5 yesterday, but none since. She denies any nausea, emesis, diarrhea or constipation issues. Her sense of taste is definitely changed, she denies any frank mouth sores. Energy level is appropriately decreased. She is sleeping well. She denies any shortness of breath or chest pain. She has not had any diffuse bone pain, just slight discomfort in her mid back region.  A detailed review of systems is otherwise noncontributory as noted below.  Review of Systems: Constitutional:  no weight loss, fever, night sweats and feels well Eyes: uses glasses ENT: no complaints Cardiovascular: no chest pain or dyspnea on exertion Respiratory: no cough, shortness of breath, or wheezing Neurological: no TIA or stroke symptoms Dermatological: negative Gastrointestinal: no abdominal pain, change in  bowel habits, or black or bloody stools Genito-Urinary: no dysuria, trouble voiding, or hematuria Hematological and Lymphatic: negative Breast: positive for - known L breast mass Musculoskeletal: negative Remaining ROS negative.   Medications:   I have reviewed the patient's current medications.  Current Outpatient Prescriptions  Medication Sig Dispense Refill  . acetaminophen (TYLENOL) 500 MG tablet Take 500 mg by mouth every 6 (six) hours as needed. For pain/headaches.      . dexamethasone (DECADRON) 4 MG tablet Take 2 tablets by mouth once a day on the day after chemotherapy and then take 2 tablets two times a day for 2 days. Take with food.  30 tablet  1  . lidocaine-prilocaine (EMLA) cream Apply topically as needed.  30 g  1  . LORazepam (ATIVAN) 0.5 MG tablet Take 1 tablet (0.5 mg total) by mouth every 6 (six) hours as needed (Nausea or vomiting).  30 tablet  0  . ondansetron (ZOFRAN) 8 MG tablet Take 1 tablet two times a day as needed for nausea or vomiting starting on the third day after chemotherapy.  30 tablet  1  . prochlorperazine (COMPAZINE) 10 MG tablet Take 1 tablet (10 mg total) by mouth every 6 (six) hours as needed (Nausea or vomiting).  30 tablet  1  . prochlorperazine (COMPAZINE) 25 MG suppository Place 1 suppository (25 mg total) rectally every 12 (twelve) hours as needed for nausea.  12 suppository  3   No current facility-administered medications for this visit.   Facility-Administered Medications Ordered in Other Visits  Medication Dose Route Frequency Provider Last Rate Last Dose  . lidocaine-prilocaine (EMLA) cream   Topical Once  Pierce Crane, MD        Allergies:  Allergies  Allergen Reactions  . Adhesive (Tape) Itching and Rash    Rash and itching at steristrip and EKG lead pad sites    Physical Exam: Filed Vitals:   03/29/12 0942  BP: 94/67  Pulse: 112  Temp: 98.7 F (37.1 C)    Body mass index is 23.92 kg/(m^2). Weight: 157 lbs. HEENT:  Sclerae  anicteric, conjunctivae pink.  Oropharynx clear.  No mucositis or candidiasis.   Nodes:  No cervical, supraclavicular, or axillary lymphadenopathy palpated.  Breast Exam: Deferred. Lungs:  Clear to auscultation bilaterally.  No crackles, rhonchi, or wheezes.   Heart:  Regular rate and rhythm.   Abdomen:  Soft, nontender.  Positive bowel sounds.  No organomegaly or masses palpated.   Musculoskeletal:  No focal spinal tenderness to palpation.  Extremities:  Benign.  No peripheral edema or cyanosis.   Skin:  Benign.   Neuro:  Nonfocal, alert and oriented x 3.   Lab Results: Lab Results  Component Value Date   WBC 0.8* 03/29/2012   HGB 10.5* 03/29/2012   HCT 30.2* 03/29/2012   MCV 92.4 03/29/2012   PLT 57* 03/29/2012   NEUTROABS 0.3* 03/29/2012     Chemistry      Component Value Date/Time   NA 141 03/22/2012 1303   K 3.5 03/22/2012 1303   CL 106 03/22/2012 1303   CO2 29 03/22/2012 1303   BUN 11 03/22/2012 1303   CREATININE 0.59 03/22/2012 1303      Component Value Date/Time   CALCIUM 8.5 03/22/2012 1303   ALKPHOS 72 03/22/2012 1303   AST 23 03/22/2012 1303   ALT 20 03/22/2012 1303   BILITOT 0.3 03/22/2012 1303      Lab Results  Component Value Date   LABCA2 56* 03/08/2012   Blood cultures X 2 from 03/16/12: (1 set peripheral, 2nd set via PORT) = NO GROWTH.   Radiological Studies: Nm Pet Image Initial (pi) Skull Base To Thigh 02/12/2012  *RADIOLOGY REPORT*  Clinical Data:  Initial treatment strategy for breast cancer.  NUCLEAR MEDICINE PET CT INITIAL (PI) SKULL BASE TO THIGH  Technique:  16.6 mCi F-18 FDG was injected intravenously via the right antecubital.  Full-ring PET imaging was performed from the skull base through the mid-thighs 60  minutes after injection.  CT data was obtained and used for attenuation correction and anatomic localization only.  (This was not acquired as a diagnostic CT examination.)  Fasting Blood Glucose:  96  Patient Weight:  165 pounds.  Comparison:  MRI breast  01/22/2012.  Findings: The large left breast masses markedly hypermetabolic with diffuse increased FDG uptake and SUV max of 16.3.  There are also enlarged hypermetabolic axillary and subpectoral lymph nodes. There is a hypermetabolic left internal mammary lymph node and a metastatic supraclavicular lymph node on the right side at the level of the first anterior rib.  Mild diffuse skin thickening noted over the left breast and diffuse hazy increased FDG activity suggesting lymphatic spread.  No findings for pulmonary metastatic disease, mediastinal or hilar lymphadenopathy.  No metastatic disease is identified in the abdomen or pelvis.  No findings for metastatic bone disease.  No significant CT findings.  There is a simple appearing cyst associated with the left ovary.  Surgical changes are noted from a cholecystectomy and anterior abdominal wall surgery.  IMPRESSION:  1.  Large left hypermetabolic breast mass consistent with known breast cancer. 2.  FDG positive  left axillary, left subpectoral, left internal mammary and right supraclavicular lymphadenopathy. 3.  No findings for metastatic disease involving the lungs, abdomen, pelvis or osseous structures.  Original Report Authenticated By: P. Loralie Champagne, M.D.   Dg Chest Port 1 View 02/11/2012  *RADIOLOGY REPORT*  Clinical Data: 51 year old female status post Port-A-Cath insertion.  PORTABLE CHEST - 1 VIEW  Comparison: 02/10/2012.  Findings: Semi upright AP portable view 1100 hours.  Right chest Port-A-Cath in place. Right subclavian approach catheter tip is at the level of the right atrium.  No pneumothorax.  Slightly lower lung volumes.  The lungs remain clear.  Stable cardiac size and mediastinal contours.  IMPRESSION: Right subclavian approach Port-A-Cath placed as above.  No acute cardiopulmonary abnormality.  Original Report Authenticated By: Harley Hallmark, M.D.   Assessment:  Sydney Cervantes is a 51 year old [place] woman with a locally  advanced, clinical stage T4, N3, infiltrating lobular carcinoma of the left breast with lymph node involvement and diffuse skin thickening. ER/PR positive at 63/5% respectively, HER-2 negative, elevated Ki-67. Currently day 7 cycle 3/4 planned neoadjuvant dose dense FEC with Neulasta support on day 2, all three agents given at a 20% dose reduction starting with cycle 3 due to #2, excellent clinical response.  2. History of  severe neutropenia with associated fever, despite Neulasta, covered with Cipro 500mg  po BID, Augmentin 875mg  po BID, and 3 doses IV Rocephin 2.0gm on 4/10-4/12/13. Currently on Cipro 500 mg by mouth twice a day since 03/26/2012.  Case reviewed with Dr. Pierce Crane, who also examined the patient.  Plan:  Sydney Cervantes will remain on Cipro 500 mg by mouth twice a day until 04/01/2012 in the PM. She'll follow neutropenic precautions.  I will see her in one week's time as scheduled for followup prior to day 1 cycle 4 of 6 planned neoadjuvant dose dense FEC and again to be given at a 20% dose reduction. This physical exam will include a CBC prior and a breast exam during. This plan was reviewed with the patient, who voices understanding and agreement.  She knows to call with any changes or problems.    Halim Surrette T, PA-C 03/29/2012

## 2012-04-05 ENCOUNTER — Other Ambulatory Visit: Payer: Self-pay | Admitting: Oncology

## 2012-04-05 ENCOUNTER — Telehealth: Payer: Self-pay | Admitting: Oncology

## 2012-04-05 ENCOUNTER — Ambulatory Visit (HOSPITAL_BASED_OUTPATIENT_CLINIC_OR_DEPARTMENT_OTHER): Payer: PRIVATE HEALTH INSURANCE | Admitting: Physician Assistant

## 2012-04-05 ENCOUNTER — Ambulatory Visit (HOSPITAL_BASED_OUTPATIENT_CLINIC_OR_DEPARTMENT_OTHER): Payer: PRIVATE HEALTH INSURANCE

## 2012-04-05 ENCOUNTER — Encounter: Payer: Self-pay | Admitting: Physician Assistant

## 2012-04-05 ENCOUNTER — Other Ambulatory Visit (HOSPITAL_BASED_OUTPATIENT_CLINIC_OR_DEPARTMENT_OTHER): Payer: PRIVATE HEALTH INSURANCE | Admitting: Lab

## 2012-04-05 VITALS — BP 109/73 | HR 100 | Temp 98.5°F

## 2012-04-05 VITALS — BP 106/74 | HR 111 | Temp 99.1°F | Ht 68.0 in | Wt 159.9 lb

## 2012-04-05 DIAGNOSIS — Z170 Estrogen receptor positive status [ER+]: Secondary | ICD-10-CM

## 2012-04-05 DIAGNOSIS — C773 Secondary and unspecified malignant neoplasm of axilla and upper limb lymph nodes: Secondary | ICD-10-CM

## 2012-04-05 DIAGNOSIS — C50519 Malignant neoplasm of lower-outer quadrant of unspecified female breast: Secondary | ICD-10-CM

## 2012-04-05 DIAGNOSIS — Z5111 Encounter for antineoplastic chemotherapy: Secondary | ICD-10-CM

## 2012-04-05 DIAGNOSIS — C50919 Malignant neoplasm of unspecified site of unspecified female breast: Secondary | ICD-10-CM

## 2012-04-05 LAB — CBC WITH DIFFERENTIAL/PLATELET
BASO%: 0.9 % (ref 0.0–2.0)
EOS%: 0.1 % (ref 0.0–7.0)
MCH: 31.5 pg (ref 25.1–34.0)
MCHC: 33.6 g/dL (ref 31.5–36.0)
NEUT%: 72.3 % (ref 38.4–76.8)
RDW: 16.6 % — ABNORMAL HIGH (ref 11.2–14.5)
lymph#: 1 10*3/uL (ref 0.9–3.3)

## 2012-04-05 LAB — COMPREHENSIVE METABOLIC PANEL
AST: 18 U/L (ref 0–37)
Albumin: 4 g/dL (ref 3.5–5.2)
Alkaline Phosphatase: 80 U/L (ref 39–117)
BUN: 13 mg/dL (ref 6–23)
Potassium: 4 mEq/L (ref 3.5–5.3)
Sodium: 142 mEq/L (ref 135–145)
Total Bilirubin: 0.4 mg/dL (ref 0.3–1.2)

## 2012-04-05 MED ORDER — SODIUM CHLORIDE 0.9 % IV SOLN
150.0000 mg | Freq: Once | INTRAVENOUS | Status: AC
  Administered 2012-04-05: 150 mg via INTRAVENOUS
  Filled 2012-04-05: qty 5

## 2012-04-05 MED ORDER — PALONOSETRON HCL INJECTION 0.25 MG/5ML
0.2500 mg | Freq: Once | INTRAVENOUS | Status: AC
  Administered 2012-04-05: 0.25 mg via INTRAVENOUS

## 2012-04-05 MED ORDER — EPIRUBICIN HCL CHEMO IV INJECTION 200 MG/100ML
50.0000 mg/m2 | Freq: Once | INTRAVENOUS | Status: AC
  Administered 2012-04-05: 94 mg via INTRAVENOUS
  Filled 2012-04-05: qty 47

## 2012-04-05 MED ORDER — DEXAMETHASONE SODIUM PHOSPHATE 4 MG/ML IJ SOLN
12.0000 mg | Freq: Once | INTRAMUSCULAR | Status: AC
  Administered 2012-04-05: 12 mg via INTRAVENOUS

## 2012-04-05 MED ORDER — SODIUM CHLORIDE 0.9 % IJ SOLN
10.0000 mL | INTRAMUSCULAR | Status: DC | PRN
  Administered 2012-04-05: 10 mL
  Filled 2012-04-05: qty 10

## 2012-04-05 MED ORDER — SODIUM CHLORIDE 0.9 % IV SOLN
Freq: Once | INTRAVENOUS | Status: AC
  Administered 2012-04-05: 14:00:00 via INTRAVENOUS

## 2012-04-05 MED ORDER — HEPARIN SOD (PORK) LOCK FLUSH 100 UNIT/ML IV SOLN
500.0000 [IU] | Freq: Once | INTRAVENOUS | Status: AC | PRN
  Administered 2012-04-05: 500 [IU]
  Filled 2012-04-05: qty 5

## 2012-04-05 MED ORDER — SODIUM CHLORIDE 0.9 % IV SOLN
300.0000 mg/m2 | Freq: Once | INTRAVENOUS | Status: AC
  Administered 2012-04-05: 560 mg via INTRAVENOUS
  Filled 2012-04-05: qty 28

## 2012-04-05 MED ORDER — FLUOROURACIL CHEMO INJECTION 2.5 GM/50ML
400.0000 mg/m2 | Freq: Once | INTRAVENOUS | Status: AC
  Administered 2012-04-05: 750 mg via INTRAVENOUS
  Filled 2012-04-05: qty 15

## 2012-04-05 NOTE — Progress Notes (Signed)
Pt PAC was accessed successfully with great blood return. Pt denied any complications during insertion and during lab draws. NS infusing while waiting for Emend. Pt began coughing and stating she felt weird. Stopped NS infusion and flushed PAC with saline pt stated "it felt cold, tingly and tickled her throat causing her to continuously cough. Good blood return noted from [facility] and she denied pain. Myriam Jacobson, Rn evaluated as well. Infusion stopped and pt given drink and crackers. Instructed we would observe for and then reattempt to make sure it was no other complications. After observation, pt stated she felt slightly better however, if flushed still felt similar sensation. Thayer Ohm, [place] notified. Instructed not to use PAC and to resume tx via IV access. IV successfully started. Treatement started without complications. PAC deaccessed per orders from Chris,PA . Stated will evaluate via CXR or other studies at a later date. Pt satisfied with decision. Pt tolerated treatment w/o complications via PIV site.

## 2012-04-05 NOTE — Telephone Encounter (Signed)
gve the pt her may,june 2013 appt calendar °

## 2012-04-05 NOTE — Patient Instructions (Addendum)
[facility] Discharge Instructions for Patients Receiving Chemotherapy  Today you received the following chemotherapy agents Epirubicin, Adrucil and Cytoxan.  To help prevent nausea and vomiting after your treatment, we encourage you to take your nausea medication. Begin taking it at 7 pm and take it as often as prescribed for the next 24 to 72 hours.   If you develop nausea and vomiting that is not controlled by your nausea medication, call the clinic. If it is after clinic hours your family physician or the after hours number for the clinic or go to the Emergency Department.   BELOW ARE SYMPTOMS THAT SHOULD BE REPORTED IMMEDIATELY:  *FEVER GREATER THAN 100.5 F  *CHILLS WITH OR WITHOUT FEVER  NAUSEA AND VOMITING THAT IS NOT CONTROLLED WITH YOUR NAUSEA MEDICATION  *UNUSUAL SHORTNESS OF BREATH  *UNUSUAL BRUISING OR BLEEDING  TENDERNESS IN MOUTH AND THROAT WITH OR WITHOUT PRESENCE OF ULCERS  *URINARY PROBLEMS  *BOWEL PROBLEMS  UNUSUAL RASH Items with * indicate a potential emergency and should be followed up as soon as possible.  One of the nurses will contact you 24 hours after your treatment. Please let the nurse know about any problems that you may have experienced. Feel free to call the clinic you have any questions or concerns. The clinic phone number is [phone].   I have been informed and understand all the instructions given to me. I know to contact the clinic, my physician, or go to the Emergency Department if any problems should occur. I do not have any questions at this time, but understand that I may call the clinic during office hours   should I have any questions or need assistance in obtaining follow up care.    __________________________________________  _____________  __________ Signature of Patient or Authorized Representative            Date                   Time    __________________________________________ Nurse's Signature

## 2012-04-05 NOTE — Progress Notes (Signed)
Hematology and Oncology Follow Up Visit  SHATONIA HOOTS [id number] [birth year] 51 y.o. 04/05/2012    HPI: Hannelore is a 51 year old [place] woman with a locally advanced, clinical stage T4, N3, infiltrating lobular carcinoma of the left breast with lymph node involvement and diffuse skin thickening. ER/PR positive at 63/5% respectively, HER-2 negative, elevated Ki-67. Due for day 1 cycle 4/6 planned neoadjuvant dose dense FEC with Neulasta support on day 2. Excellent clinical response.  2. History of  severe neutropenia with associated fever, despite Neulasta, covered with Cipro 500mg  po BID, Augmentin 875mg  po BID, and 3 doses IV Rocephin 2.0gm on 4/10-4/12/13, now with normalization of WBC.  Interim History:   Kanyah is seen today with her husband in accompaniment for followup prior to day 1 cycle 4/6 planned neoadjuvant dose dense FEC. , Patient has a history of severe neutropenia with with associated fever after cycle 2. She was covered in the outpatient setting with oral Cipro 500 mg by mouth twice a day being continued, Augmentin 875 by mouth twice a day and she received 3 doses of IV Rocephin 2 g between 4/10-04/11/2012 given daily.  She's had no further fevers, but she does experience easy fatigability.  No nausea, or emesis issues. She did not experience any oral thrush or vaginal candidiasis. She denies shortness of breath or chest pain. A detailed review of systems is otherwise noncontributory as noted below.  Review of Systems: Constitutional:  no weight loss, fever, night sweats and feels well Eyes: uses glasses ENT: no complaints Cardiovascular: no chest pain or dyspnea on exertion Respiratory: no cough, shortness of breath, or wheezing Neurological: no TIA or stroke symptoms Dermatological: negative Gastrointestinal: no abdominal pain, change in bowel habits, or black or bloody stools Genito-Urinary: no dysuria, trouble voiding, or hematuria Hematological and  Lymphatic: negative Breast: positive for - known L breast mass Musculoskeletal: negative Remaining ROS negative.   Medications:   I have reviewed the patient's current medications.  Current Outpatient Prescriptions  Medication Sig Dispense Refill  . acetaminophen (TYLENOL) 500 MG tablet Take 500 mg by mouth every 6 (six) hours as needed. For pain/headaches.      . dexamethasone (DECADRON) 4 MG tablet Take 2 tablets by mouth once a day on the day after chemotherapy and then take 2 tablets two times a day for 2 days. Take with food.  30 tablet  1  . lidocaine-prilocaine (EMLA) cream Apply topically as needed.  30 g  1  . LORazepam (ATIVAN) 0.5 MG tablet Take 1 tablet (0.5 mg total) by mouth every 6 (six) hours as needed (Nausea or vomiting).  30 tablet  0  . ondansetron (ZOFRAN) 8 MG tablet Take 1 tablet two times a day as needed for nausea or vomiting starting on the third day after chemotherapy.  30 tablet  1  . prochlorperazine (COMPAZINE) 10 MG tablet Take 1 tablet (10 mg total) by mouth every 6 (six) hours as needed (Nausea or vomiting).  30 tablet  1  . prochlorperazine (COMPAZINE) 25 MG suppository Place 1 suppository (25 mg total) rectally every 12 (twelve) hours as needed for nausea.  12 suppository  3   No current facility-administered medications for this visit.   Facility-Administered Medications Ordered in Other Visits  Medication Dose Route Frequency Provider Last Rate Last Dose  . 0.9 %  sodium chloride infusion   Intravenous Once Pierce Crane, MD      . cyclophosphamide (CYTOXAN) 560 mg in sodium chloride 0.9 % 250  mL chemo infusion  300 mg/m2 (Treatment Plan Actual) Intravenous Once Pierce Crane, MD      . dexamethasone (DECADRON) injection 12 mg  12 mg Intravenous Once Pierce Crane, MD      . epirubicin [facility]) chemo injection 94 mg  50 mg/m2 (Treatment Plan Actual) Intravenous Once Pierce Crane, MD      . fluorouracil (ADRUCIL) chemo injection 750 mg  400 mg/m2 (Treatment  Plan Actual) Intravenous Once Pierce Crane, MD      . fosaprepitant (EMEND) 150 mg in sodium chloride 0.9 % 145 mL IVPB  150 mg Intravenous Once Pierce Crane, MD      . heparin lock flush 100 unit/mL  500 Units Intracatheter Once PRN Pierce Crane, MD      . lidocaine-prilocaine (EMLA) cream   Topical Once Pierce Crane, MD      . palonosetron (ALOXI) injection 0.25 mg  0.25 mg Intravenous Once Pierce Crane, MD      . sodium chloride 0.9 % injection 10 mL  10 mL Intracatheter PRN Pierce Crane, MD        Allergies:  Allergies  Allergen Reactions  . Adhesive (Tape) Itching and Rash    Rash and itching at steristrip and EKG lead pad sites    Physical Exam: Filed Vitals:   04/05/12 1134  BP: 106/74  Pulse: 111  Temp: 99.1 F (37.3 C)    Body mass index is 24.31 kg/(m^2). Weight: 159 lbs. HEENT:  Sclerae anicteric, conjunctivae pink.  Oropharynx clear.  No mucositis or candidiasis.   Nodes:  No cervical, supraclavicular, or axillary lymphadenopathy palpated.  Breast Exam: The left breast was examined, and the erythema has completely abated. The whole breast is still pretty soft, there is a small amount of natural droop present. No frank evidence of palpable lymphadenopathy.  Lungs:  Clear to auscultation bilaterally.  No crackles, rhonchi, or wheezes.   Heart:  Regular rate and rhythm.   Abdomen:  Soft, nontender.  Positive bowel sounds.  No organomegaly or masses palpated.   Musculoskeletal:  No focal spinal tenderness to palpation.  Extremities:  Benign.  No peripheral edema or cyanosis.   Skin:  Benign.   Neuro:  Nonfocal, alert and oriented x 3.   Lab Results: Lab Results  Component Value Date   WBC 7.0 04/05/2012   HGB 10.0* 04/05/2012   HCT 29.8* 04/05/2012   MCV 94.0 04/05/2012   PLT 118* 04/05/2012   NEUTROABS 5.1 04/05/2012     Chemistry      Component Value Date/Time   NA 141 03/22/2012 1303   K 3.5 03/22/2012 1303   CL 106 03/22/2012 1303   CO2 29 03/22/2012 1303   BUN 11  03/22/2012 1303   CREATININE 0.59 03/22/2012 1303      Component Value Date/Time   CALCIUM 8.5 03/22/2012 1303   ALKPHOS 72 03/22/2012 1303   AST 23 03/22/2012 1303   ALT 20 03/22/2012 1303   BILITOT 0.3 03/22/2012 1303      Lab Results  Component Value Date   LABCA2 56* 03/08/2012    Assessment:  Jiselle is a 51 year old [place] woman with a locally advanced, clinical stage T4, N3, infiltrating lobular carcinoma of the left breast with lymph node involvement and diffuse skin thickening. ER/PR positive at 63/5% respectively, HER-2 negative, elevated Ki-67. Due for day 1 cycle 4/6 planned neoadjuvant dose dense FEC with Neulasta support on day 2. Excellent clinical response.  2. History of  severe neutropenia with  associated fever, despite Neulasta, covered with Cipro 500mg  po BID, Augmentin 875mg  po BID, and 3 doses IV Rocephin 2.0gm on 4/10-4/12/13, now with normalization of WBC.  Case reviewed with Dr. Pierce Crane.  Plan:  Jazmine will receive day 1 cycle 4/6 planned neoadjuvant dose dense FEC today as scheduled, with a 20% dose reduction on 5-FU, 50% dose reduction on Epirubicin, and 40% dose reduction on Cytoxan.  She will return tomorrow for Neulasta support. She will initiate Cipro 500 mg by mouth twice a day on 04/09/2012, I will see her on 04/12/2012 for nadir assessment.  This plan was reviewed with the patient, who voices understanding and agreement.  She knows to call with any changes or problems.    Xochilt Conant T, PA-C 04/05/2012

## 2012-04-06 ENCOUNTER — Ambulatory Visit (HOSPITAL_BASED_OUTPATIENT_CLINIC_OR_DEPARTMENT_OTHER): Payer: PRIVATE HEALTH INSURANCE

## 2012-04-06 VITALS — BP 106/74 | HR 108 | Temp 98.0°F

## 2012-04-06 DIAGNOSIS — Z5189 Encounter for other specified aftercare: Secondary | ICD-10-CM

## 2012-04-06 DIAGNOSIS — C50519 Malignant neoplasm of lower-outer quadrant of unspecified female breast: Secondary | ICD-10-CM

## 2012-04-06 MED ORDER — PEGFILGRASTIM INJECTION 6 MG/0.6ML
6.0000 mg | Freq: Once | SUBCUTANEOUS | Status: AC
  Administered 2012-04-06: 6 mg via SUBCUTANEOUS
  Filled 2012-04-06: qty 0.6

## 2012-04-12 ENCOUNTER — Other Ambulatory Visit (HOSPITAL_BASED_OUTPATIENT_CLINIC_OR_DEPARTMENT_OTHER): Payer: PRIVATE HEALTH INSURANCE | Admitting: Lab

## 2012-04-12 ENCOUNTER — Ambulatory Visit (HOSPITAL_BASED_OUTPATIENT_CLINIC_OR_DEPARTMENT_OTHER): Payer: PRIVATE HEALTH INSURANCE | Admitting: Physician Assistant

## 2012-04-12 ENCOUNTER — Ambulatory Visit (HOSPITAL_COMMUNITY)
Admission: RE | Admit: 2012-04-12 | Discharge: 2012-04-12 | Disposition: A | Discharge status: Home / Self Care | Payer: PRIVATE HEALTH INSURANCE | Source: Ambulatory Visit | Attending: Physician Assistant | Admitting: Physician Assistant

## 2012-04-12 ENCOUNTER — Other Ambulatory Visit: Payer: Self-pay | Admitting: *Deleted

## 2012-04-12 ENCOUNTER — Encounter: Payer: Self-pay | Admitting: Physician Assistant

## 2012-04-12 VITALS — BP 103/70 | HR 106 | Temp 99.0°F | Ht 68.0 in | Wt 157.8 lb

## 2012-04-12 DIAGNOSIS — Z170 Estrogen receptor positive status [ER+]: Secondary | ICD-10-CM

## 2012-04-12 DIAGNOSIS — C50519 Malignant neoplasm of lower-outer quadrant of unspecified female breast: Secondary | ICD-10-CM

## 2012-04-12 DIAGNOSIS — C50912 Malignant neoplasm of unspecified site of left female breast: Secondary | ICD-10-CM

## 2012-04-12 DIAGNOSIS — R6889 Other general symptoms and signs: Secondary | ICD-10-CM | POA: Insufficient documentation

## 2012-04-12 LAB — CBC WITH DIFFERENTIAL/PLATELET
BASO%: 0.3 % (ref 0.0–2.0)
HCT: 26.3 % — ABNORMAL LOW (ref 34.8–46.6)
MCHC: 33.8 g/dL (ref 31.5–36.0)
MONO#: 0.3 10*3/uL (ref 0.1–0.9)
RBC: 2.85 10*6/uL — ABNORMAL LOW (ref 3.70–5.45)
WBC: 3.2 10*3/uL — ABNORMAL LOW (ref 3.9–10.3)
lymph#: 0.5 10*3/uL — ABNORMAL LOW (ref 0.9–3.3)

## 2012-04-12 MED ORDER — IOHEXOL 300 MG/ML  SOLN
50.0000 mL | Freq: Once | INTRAMUSCULAR | Status: AC | PRN
  Administered 2012-04-12: 20 mL

## 2012-04-12 NOTE — Progress Notes (Signed)
Hematology and Oncology Follow Up Visit  Sydney Cervantes [id number] 1961/03/26 51 y.o. 04/12/2012    HPI: Sydney Cervantes is a 51 year old [place] woman with a locally advanced, clinical stage T4, N3, infiltrating lobular carcinoma of the left breast with lymph node involvement and diffuse skin thickening. ER/PR positive at 63/5% respectively, HER-2 negative, elevated Ki-67. Currently day 7 cycle 4/6 planned neoadjuvant dose dense FEC with Neulasta support on day 2. 20% dose reduction on 5-FU, 50% dose reduction on epirubicin, 40% dose reduction on Cytoxan.  2. History of  severe neutropenia with associated fever, despite Neulasta, covered with Cipro 500mg  po BID, Augmentin 875mg  po BID, and 3 doses IV Rocephin 2.0gm on 4/10-4/12/13.   Interim History:   [name] is seen today with her husband in accompaniment for followup after cycle 4/6 planned neoadjuvant dose dense FEC. With dose reductions. Patient has a history of severe neutropenia with with associated fever after cycle 2. She was covered in the outpatient setting with oral Cipro 500 mg by mouth twice a day being continued, Augmentin 875 by mouth twice a day and she received 3 doses of IV Rocephin 2 g between 4/10-04/11/2012 given daily.  She is currently feeling well but appropriately fatigued. She has been on Cipro 500 mg by mouth twice a day since 04/09/2012. She denies fevers, chills, or night sweats. No shortness of breath or chest pain issues. No nausea, she's had some mild constipation. Of note, her port date issues last week, patient had a dry neck he cough occur when her port was being infused, despite a good blood return, it was felt rooted to utilize a peripheral access, she does have some irritation over her access site.  A detailed review of systems is otherwise noncontributory as noted below.  Review of Systems: Constitutional:  no weight loss, fever, night sweats and feels well Eyes: uses glasses ENT: no  complaints Cardiovascular: no chest pain or dyspnea on exertion Respiratory: no cough, shortness of breath, or wheezing Neurological: no TIA or stroke symptoms Dermatological: negative Gastrointestinal: no abdominal pain, change in bowel habits, or black or bloody stools Genito-Urinary: no dysuria, trouble voiding, or hematuria Hematological and Lymphatic: negative Breast: positive for - known L breast mass Musculoskeletal: negative Remaining ROS negative.   Medications:   I have reviewed the patient's current medications.  Current Outpatient Prescriptions  Medication Sig Dispense Refill  . acetaminophen (TYLENOL) 500 MG tablet Take 500 mg by mouth every 6 (six) hours as needed. For pain/headaches.      . dexamethasone (DECADRON) 4 MG tablet Take 2 tablets by mouth once a day on the day after chemotherapy and then take 2 tablets two times a day for 2 days. Take with food.  30 tablet  1  . lidocaine-prilocaine (EMLA) cream Apply topically as needed.  30 g  1  . LORazepam (ATIVAN) 0.5 MG tablet Take 1 tablet (0.5 mg total) by mouth every 6 (six) hours as needed (Nausea or vomiting).  30 tablet  0  . ondansetron (ZOFRAN) 8 MG tablet Take 1 tablet two times a day as needed for nausea or vomiting starting on the third day after chemotherapy.  30 tablet  1  . prochlorperazine (COMPAZINE) 10 MG tablet Take 1 tablet (10 mg total) by mouth every 6 (six) hours as needed (Nausea or vomiting).  30 tablet  1  . prochlorperazine (COMPAZINE) 25 MG suppository Place 1 suppository (25 mg total) rectally every 12 (twelve) hours as needed for nausea.  12  suppository  3   No current facility-administered medications for this visit.   Facility-Administered Medications Ordered in Other Visits  Medication Dose Route Frequency Provider Last Rate Last Dose  . lidocaine-prilocaine (EMLA) cream   Topical Once Pierce Crane, MD        Allergies:  Allergies  Allergen Reactions  . Adhesive (Tape) Itching and  Rash    Rash and itching at steristrip and EKG lead pad sites    Physical Exam: Filed Vitals:   04/12/12 0907  BP: 103/70  Pulse: 106  Temp: 99 F (37.2 C)    Body mass index is 23.99 kg/(m^2). Weight: 157 lbs. HEENT:  Sclerae anicteric, conjunctivae pink.  Oropharynx clear.  No mucositis or candidiasis.   Nodes:  No cervical, supraclavicular, or axillary lymphadenopathy palpated.  Breast Exam: Deferred. Lungs:  Clear to auscultation bilaterally.  No crackles, rhonchi, or wheezes.   Heart:  Regular rate and rhythm.   Abdomen:  Soft, nontender.  Positive bowel sounds.  No organomegaly or masses palpated.   Musculoskeletal:  No focal spinal tenderness to palpation.  Extremities:  Benign.  No peripheral edema or cyanosis.   Skin:  Benign, except over the anterior left forearm which shows the site of peripheral access being a bit irritated but no evidence to suggest frank extravasation, more of a very superficial chemical phlebitis.   Neuro:  Nonfocal, alert and oriented x 3.   Lab Results: Lab Results  Component Value Date   WBC 3.2* 04/12/2012   HGB 8.9* 04/12/2012   HCT 26.3* 04/12/2012   MCV 92.3 04/12/2012   PLT 57* 04/12/2012   NEUTROABS 2.3 04/12/2012     Chemistry      Component Value Date/Time   NA 142 04/05/2012 1109   K 4.0 04/05/2012 1109   CL 108 04/05/2012 1109   CO2 24 04/05/2012 1109   BUN 13 04/05/2012 1109   CREATININE 0.84 04/05/2012 1109      Component Value Date/Time   CALCIUM 8.6 04/05/2012 1109   ALKPHOS 80 04/05/2012 1109   AST 18 04/05/2012 1109   ALT 13 04/05/2012 1109   BILITOT 0.4 04/05/2012 1109      Lab Results  Component Value Date   LABCA2 56* 03/08/2012    Assessment:  Sydney Cervantes is a 51 year old [place] woman with a locally advanced, clinical stage T4, N3, infiltrating lobular carcinoma of the left breast with lymph node involvement and diffuse skin thickening. ER/PR positive at 63/5% respectively, HER-2 negative, elevated Ki-67.  Currently day 7 cycle 4/6 planned neoadjuvant dose dense FEC with Neulasta support on day 2. 20% dose reduction on 5-FU, 50% dose reduction on epirubicin, 40% dose reduction on Cytoxan.  2. History of  severe neutropenia with associated fever, despite Neulasta, covered with Cipro 500mg  po BID, Augmentin 875mg  po BID, and 3 doses IV Rocephin 2.0gm on 4/10-4/12/13, now with normalization of WBC.   Case to be reviewed with Dr. Pierce Crane.  Plan:  Syenna will discontinue Cipro. We will send her for a dye study of her port this afternoon. If necessary, she will be referred back first to Dr. Ezzard Standing for port revision or interventional radiology if appropriate. Otherwise, I will see her in one week time for followup exam prior to day 1 cycle 5 of neoadjuvant dose dense FEC, again given at dose reductions.  This plan was reviewed with the patient, who voices understanding and agreement.  She knows to call with any changes or problems.  Gelene Recktenwald T, PA-C 04/12/2012

## 2012-04-19 ENCOUNTER — Telehealth: Payer: Self-pay | Admitting: *Deleted

## 2012-04-19 ENCOUNTER — Ambulatory Visit (HOSPITAL_BASED_OUTPATIENT_CLINIC_OR_DEPARTMENT_OTHER): Payer: PRIVATE HEALTH INSURANCE | Admitting: Physician Assistant

## 2012-04-19 ENCOUNTER — Ambulatory Visit: Payer: PRIVATE HEALTH INSURANCE

## 2012-04-19 ENCOUNTER — Other Ambulatory Visit (HOSPITAL_BASED_OUTPATIENT_CLINIC_OR_DEPARTMENT_OTHER): Payer: PRIVATE HEALTH INSURANCE | Admitting: Lab

## 2012-04-19 VITALS — BP 102/69 | HR 103 | Temp 98.3°F | Ht 68.0 in | Wt 161.4 lb

## 2012-04-19 DIAGNOSIS — C50912 Malignant neoplasm of unspecified site of left female breast: Secondary | ICD-10-CM

## 2012-04-19 DIAGNOSIS — C50919 Malignant neoplasm of unspecified site of unspecified female breast: Secondary | ICD-10-CM

## 2012-04-19 DIAGNOSIS — C773 Secondary and unspecified malignant neoplasm of axilla and upper limb lymph nodes: Secondary | ICD-10-CM

## 2012-04-19 DIAGNOSIS — D709 Neutropenia, unspecified: Secondary | ICD-10-CM

## 2012-04-19 DIAGNOSIS — I998 Other disorder of circulatory system: Secondary | ICD-10-CM

## 2012-04-19 LAB — CBC WITH DIFFERENTIAL/PLATELET
Basophils Absolute: 0 10*3/uL (ref 0.0–0.1)
Eosinophils Absolute: 0 10*3/uL (ref 0.0–0.5)
HCT: 26.6 % — ABNORMAL LOW (ref 34.8–46.6)
HGB: 8.6 g/dL — ABNORMAL LOW (ref 11.6–15.9)
LYMPH%: 8.3 % — ABNORMAL LOW (ref 14.0–49.7)
MCV: 100.4 fL (ref 79.5–101.0)
MONO%: 13.1 % (ref 0.0–14.0)
NEUT#: 5.1 10*3/uL (ref 1.5–6.5)
Platelets: 94 10*3/uL — ABNORMAL LOW (ref 145–400)

## 2012-04-19 NOTE — Telephone Encounter (Signed)
sent michelle an email informing her to please add treatment for 04-26-2012 after the md visit add an injection onto the patient schedule for 04-27-2012 at 2:30pm

## 2012-04-19 NOTE — Progress Notes (Signed)
Hematology and Oncology Follow Up Visit  Sydney Cervantes [id number] Dec 26, 1960 51 y.o. 04/19/2012    HPI: Sydney Cervantes is a 51 year old [place] woman with a locally advanced, clinical stage T4, N3, infiltrating lobular carcinoma of the left breast with lymph node involvement and diffuse skin thickening. ER/PR positive at 63/5% respectively, HER-2 negative, elevated Ki-67. Due for day 1 cycle 5/6 planned neoadjuvant dose dense FEC with Neulasta support on day 2. 20% dose reduction on 5-FU, 50% dose reduction on epirubicin, 40% dose reduction on Cytoxan.  2. History of  severe neutropenia with associated fever, despite Neulasta, covered with Cipro 500mg  po BID, Augmentin 875mg  po BID, and 3 doses IV Rocephin 2.0gm on 4/10-4/12/13.   Interim History:   Sydney Cervantes is seen today with her husband in accompaniment for followup in anticipation of day 1 cycle 5 of neoadjuvant dose dense FEC given a dose reductions. Of note, her dye study obtained for port patency was normal. She is fatigued, she denies any unexplained fevers or chills though. No shortness of breath or chest pain. No nausea, emesis, diarrhea or constipation problems. She feels that her breast has softened again. Of concern though, is the fact that the area on her left forearm is now reddened, and a bit raised. It is also tender. A detailed review of systems is otherwise noncontributory as noted below.  Review of Systems: Constitutional:  no weight loss, fever, night sweats and feels well Eyes: uses glasses ENT: no complaints Cardiovascular: no chest pain or dyspnea on exertion Respiratory: no cough, shortness of breath, or wheezing Neurological: no TIA or stroke symptoms Dermatological: negative Gastrointestinal: no abdominal pain, change in bowel habits, or black or bloody stools Genito-Urinary: no dysuria, trouble voiding, or hematuria Hematological and Lymphatic: negative Breast: positive for - known L breast  mass Musculoskeletal: negative Remaining ROS negative.   Medications:   I have reviewed the patient's current medications.  Current Outpatient Prescriptions  Medication Sig Dispense Refill  . acetaminophen (TYLENOL) 500 MG tablet Take 500 mg by mouth every 6 (six) hours as needed. For pain/headaches.      . dexamethasone (DECADRON) 4 MG tablet Take 2 tablets by mouth once a day on the day after chemotherapy and then take 2 tablets two times a day for 2 days. Take with food.  30 tablet  1  . lidocaine-prilocaine (EMLA) cream Apply topically as needed.  30 g  1  . LORazepam (ATIVAN) 0.5 MG tablet Take 1 tablet (0.5 mg total) by mouth every 6 (six) hours as needed (Nausea or vomiting).  30 tablet  0  . ondansetron (ZOFRAN) 8 MG tablet Take 1 tablet two times a day as needed for nausea or vomiting starting on the third day after chemotherapy.  30 tablet  1  . prochlorperazine (COMPAZINE) 10 MG tablet Take 1 tablet (10 mg total) by mouth every 6 (six) hours as needed (Nausea or vomiting).  30 tablet  1  . prochlorperazine (COMPAZINE) 25 MG suppository Place 1 suppository (25 mg total) rectally every 12 (twelve) hours as needed for nausea.  12 suppository  3   No current facility-administered medications for this visit.   Facility-Administered Medications Ordered in Other Visits  Medication Dose Route Frequency Provider Last Rate Last Dose  . lidocaine-prilocaine (EMLA) cream   Topical Once Pierce Crane, MD        Allergies:  Allergies  Allergen Reactions  . Adhesive (Tape) Itching and Rash    Rash and itching at steristrip and  EKG lead pad sites    Physical Exam: Filed Vitals:   04/19/12 1307  BP: 102/69  Pulse: 103  Temp: 98.3 F (36.8 C)    Body mass index is 24.54 kg/(m^2). Weight: 161 lbs. HEENT:  Sclerae anicteric, conjunctivae pink.  Oropharynx clear.  No mucositis or candidiasis.   Nodes:  No cervical, supraclavicular, or axillary lymphadenopathy palpated.  Breast Exam:  Deferred. Lungs:  Clear to auscultation bilaterally.  No crackles, rhonchi, or wheezes.   Heart:  Regular rate and rhythm.   Abdomen:  Soft, nontender.  Positive bowel sounds.  No organomegaly or masses palpated.   Musculoskeletal:  No focal spinal tenderness to palpation.  Extremities:  Benign.  No peripheral edema or cyanosis.   Skin: Over the anterior left forearm,  the area previously noted to have ecchymoses and possible superficial thrombophlebitis appears more angry and reddened, there is also a bit of cording underneath. No evidence of skin breakdown.   Neuro:  Nonfocal, alert and oriented x 3.   Lab Results: Lab Results  Component Value Date   WBC 6.5 04/19/2012   HGB 8.6* 04/19/2012   HCT 26.6* 04/19/2012   MCV 100.4 04/19/2012   PLT 94* 04/19/2012   NEUTROABS 5.1 04/19/2012     Chemistry      Component Value Date/Time   NA 142 04/05/2012 1109   K 4.0 04/05/2012 1109   CL 108 04/05/2012 1109   CO2 24 04/05/2012 1109   BUN 13 04/05/2012 1109   CREATININE 0.84 04/05/2012 1109      Component Value Date/Time   CALCIUM 8.6 04/05/2012 1109   ALKPHOS 80 04/05/2012 1109   AST 18 04/05/2012 1109   ALT 13 04/05/2012 1109   BILITOT 0.4 04/05/2012 1109      Lab Results  Component Value Date   LABCA2 56* 03/08/2012    Assessment:  Sydney Cervantes is a 51 year old [place] woman with a locally advanced, clinical stage T4, N3, infiltrating lobular carcinoma of the left breast with lymph node involvement and diffuse skin thickening. ER/PR positive at 63/5% respectively, HER-2 negative, elevated Ki-67. For day 1 cycle 5/6 planned neoadjuvant dose dense FEC with Neulasta support on day 2. 20% dose reduction on 5-FU, 50% dose reduction on epirubicin, 40% dose reduction on Cytoxan.  2. History of  severe neutropenia with associated fever, despite Neulasta, covered with Cipro 500mg  po BID, Augmentin 875mg  po BID, and 3 doses IV Rocephin 2.0gm on 4/10-4/12/13, now with normalization of  WBC.  3. Area on left forearm suggesting possible chemotherapy extravasation versus localized thrombophlebitis.  4. Mildly symptomatic anemia, asymptomatic thrombocytopenia chemotherapy-induced.   Case to be reviewed with Dr. Pierce Crane.  Plan:  I will defer day 1 cycle 5 of neoadjuvant dose dense FEC for one week. Sydney Cervantes will start Keflex 500 mg by mouth 3 times a day.  She will contact our office later this week to let us know how her arm is doing, of course to contact us sooner if the area appears to worsen. We will regroup in one week's time for followup exam prior to resuming neoadjuvant chemotherapy, one question that would come up is as to whether or to proceed with further FEC or to switch to Taxane based therapy.  This plan was reviewed with the patient, who voices understanding and agreement.  She knows to call with any changes or problems.    Muzamil Harker T, PA-C 04/19/2012

## 2012-04-19 NOTE — Telephone Encounter (Signed)
Per e-mail from Jacki Cones, I have scheduled treatment appt to follow MD on 5/21. Appt in computer and Jacki Cones aware.  JMW

## 2012-04-20 ENCOUNTER — Ambulatory Visit: Payer: PRIVATE HEALTH INSURANCE

## 2012-04-22 ENCOUNTER — Telehealth: Payer: Self-pay | Admitting: *Deleted

## 2012-04-22 NOTE — Telephone Encounter (Signed)
PT REPORTS THAT IV INFUSION SITE IS STILL RED, SWOLLEN, AND WITH SOME PAIN BUT IT IS IMPROVING AND IS GETTING SMALLER IN SIZE AND "NOT QUITE AS RED" WILL CONTINUE TO ASSESS DURING FUTURE VISITS

## 2012-04-26 ENCOUNTER — Telehealth: Payer: Self-pay | Admitting: Oncology

## 2012-04-26 ENCOUNTER — Other Ambulatory Visit (HOSPITAL_BASED_OUTPATIENT_CLINIC_OR_DEPARTMENT_OTHER): Payer: PRIVATE HEALTH INSURANCE | Admitting: Lab

## 2012-04-26 ENCOUNTER — Ambulatory Visit (HOSPITAL_BASED_OUTPATIENT_CLINIC_OR_DEPARTMENT_OTHER): Payer: PRIVATE HEALTH INSURANCE

## 2012-04-26 ENCOUNTER — Encounter: Payer: Self-pay | Admitting: Physician Assistant

## 2012-04-26 ENCOUNTER — Ambulatory Visit (HOSPITAL_BASED_OUTPATIENT_CLINIC_OR_DEPARTMENT_OTHER): Payer: PRIVATE HEALTH INSURANCE | Admitting: Physician Assistant

## 2012-04-26 VITALS — BP 104/70 | HR 94 | Temp 98.7°F | Ht 68.0 in | Wt 161.3 lb

## 2012-04-26 VITALS — BP 102/72 | HR 98 | Temp 98.4°F

## 2012-04-26 DIAGNOSIS — T451X5A Adverse effect of antineoplastic and immunosuppressive drugs, initial encounter: Secondary | ICD-10-CM

## 2012-04-26 DIAGNOSIS — C50519 Malignant neoplasm of lower-outer quadrant of unspecified female breast: Secondary | ICD-10-CM

## 2012-04-26 DIAGNOSIS — D6481 Anemia due to antineoplastic chemotherapy: Secondary | ICD-10-CM

## 2012-04-26 DIAGNOSIS — Z170 Estrogen receptor positive status [ER+]: Secondary | ICD-10-CM

## 2012-04-26 DIAGNOSIS — I998 Other disorder of circulatory system: Secondary | ICD-10-CM

## 2012-04-26 DIAGNOSIS — Z5111 Encounter for antineoplastic chemotherapy: Secondary | ICD-10-CM

## 2012-04-26 LAB — COMPREHENSIVE METABOLIC PANEL
ALT: 28 U/L (ref 0–35)
AST: 26 U/L (ref 0–37)
Alkaline Phosphatase: 65 U/L (ref 39–117)
Creatinine, Ser: 0.61 mg/dL (ref 0.50–1.10)
Total Bilirubin: 0.7 mg/dL (ref 0.3–1.2)

## 2012-04-26 LAB — CBC WITH DIFFERENTIAL/PLATELET
Basophils Absolute: 0 10*3/uL (ref 0.0–0.1)
HCT: 28.8 % — ABNORMAL LOW (ref 34.8–46.6)
HGB: 9.6 g/dL — ABNORMAL LOW (ref 11.6–15.9)
MONO#: 0.5 10*3/uL (ref 0.1–0.9)
NEUT#: 1.3 10*3/uL — ABNORMAL LOW (ref 1.5–6.5)
NEUT%: 50 % (ref 38.4–76.8)
WBC: 2.6 10*3/uL — ABNORMAL LOW (ref 3.9–10.3)
lymph#: 0.7 10*3/uL — ABNORMAL LOW (ref 0.9–3.3)

## 2012-04-26 MED ORDER — SODIUM CHLORIDE 0.9 % IJ SOLN
10.0000 mL | INTRAMUSCULAR | Status: DC | PRN
  Administered 2012-04-26: 10 mL
  Filled 2012-04-26: qty 10

## 2012-04-26 MED ORDER — HEPARIN SOD (PORK) LOCK FLUSH 100 UNIT/ML IV SOLN
500.0000 [IU] | Freq: Once | INTRAVENOUS | Status: AC | PRN
  Administered 2012-04-26: 500 [IU]
  Filled 2012-04-26: qty 5

## 2012-04-26 MED ORDER — PACLITAXEL CHEMO INJECTION 300 MG/50ML
80.0000 mg/m2 | Freq: Once | INTRAVENOUS | Status: AC
  Administered 2012-04-26: 150 mg via INTRAVENOUS
  Filled 2012-04-26: qty 25

## 2012-04-26 MED ORDER — DEXAMETHASONE SODIUM PHOSPHATE 10 MG/ML IJ SOLN
10.0000 mg | Freq: Once | INTRAMUSCULAR | Status: AC
  Administered 2012-04-26: 10 mg via INTRAVENOUS

## 2012-04-26 MED ORDER — SODIUM CHLORIDE 0.9 % IV SOLN
Freq: Once | INTRAVENOUS | Status: AC
  Administered 2012-04-26: 11:00:00 via INTRAVENOUS

## 2012-04-26 MED ORDER — ONDANSETRON 8 MG/50ML IVPB (CHCC)
8.0000 mg | Freq: Once | INTRAVENOUS | Status: AC
  Administered 2012-04-26: 8 mg via INTRAVENOUS

## 2012-04-26 MED ORDER — DIPHENHYDRAMINE HCL 50 MG/ML IJ SOLN
50.0000 mg | Freq: Once | INTRAMUSCULAR | Status: AC
  Administered 2012-04-26: 50 mg via INTRAVENOUS

## 2012-04-26 MED ORDER — FAMOTIDINE IN NACL 20-0.9 MG/50ML-% IV SOLN
20.0000 mg | Freq: Once | INTRAVENOUS | Status: AC
  Administered 2012-04-26: 20 mg via INTRAVENOUS

## 2012-04-26 NOTE — Progress Notes (Signed)
Hematology and Oncology Follow Up Visit  Sydney Cervantes [id number] 14-Apr-1961 51 y.o. 04/26/2012    HPI: Sydney Cervantes is a 51 year old [place] woman with a locally advanced, clinical stage T4, N3, infiltrating lobular carcinoma of the left breast with lymph node involvement and diffuse skin thickening. ER/PR positive at 63/5% respectively, HER-2 negative, elevated Ki-67. For day 1 cycle 5/6 planned neoadjuvant dose dense FEC with Neulasta support on day 2. 20% dose reduction on 5-FU, 50% dose reduction on epirubicin, 40% dose reduction on Cytoxan, after one-week delay due to anemia/thrombocytopenia/and region of probable localized thrombophlebitis versus mild chemotherapy extravasation on the left forearm, covered with Keflex 500 mg by mouth 3 times a day.  2. History of  severe neutropenia with associated fever, despite Neulasta, covered with Cipro 500mg  po BID, Augmentin 875mg  po BID, and 3 doses IV Rocephin 2.0gm on 4/10-4/12/13, now with normalization of WBC.  3. Area on left forearm suggesting possible chemotherapy extravasation versus localized thrombophlebitis.  4. Mildly symptomatic anemia, asymptomatic thrombocytopenia chemotherapy-induced.   Interim History:   Sydney Cervantes is seen today with her husband in accompaniment for followup in anticipation of day 1 cycle 5 of neoadjuvant dose dense FEC given at dose reductions, after 1 week delay. She is fatigued, she denies any unexplained fevers or chills though. No shortness of breath or chest pain. No nausea, emesis, diarrhea or constipation problems. She feels that her breast continues to feel soft.  The region on her left forearm is slowly improving, she continues on Keflex 500 mg by mouth 3 times a day. A detailed review of systems is otherwise noncontributory as noted below.  Review of Systems: Constitutional:  no weight loss, fever, night sweats and feels well Eyes: uses glasses ENT: no complaints Cardiovascular: no chest pain  or dyspnea on exertion Respiratory: no cough, shortness of breath, or wheezing Neurological: no TIA or stroke symptoms Dermatological: negative Gastrointestinal: no abdominal pain, change in bowel habits, or black or bloody stools Genito-Urinary: no dysuria, trouble voiding, or hematuria Hematological and Lymphatic: negative Breast: positive for - known L breast mass Musculoskeletal: negative Remaining ROS negative.   Medications:   I have reviewed the patient's current medications.  Current Outpatient Prescriptions  Medication Sig Dispense Refill  . lidocaine-prilocaine (EMLA) cream Apply topically as needed.  30 g  1  . LORazepam (ATIVAN) 0.5 MG tablet Take 1 tablet (0.5 mg total) by mouth every 6 (six) hours as needed (Nausea or vomiting).  30 tablet  0  . acetaminophen (TYLENOL) 500 MG tablet Take 500 mg by mouth every 6 (six) hours as needed. For pain/headaches.      . dexamethasone (DECADRON) 4 MG tablet Take 2 tablets by mouth once a day on the day after chemotherapy and then take 2 tablets two times a day for 2 days. Take with food.  30 tablet  1  . ondansetron (ZOFRAN) 8 MG tablet Take 1 tablet two times a day as needed for nausea or vomiting starting on the third day after chemotherapy.  30 tablet  1  . prochlorperazine (COMPAZINE) 10 MG tablet Take 1 tablet (10 mg total) by mouth every 6 (six) hours as needed (Nausea or vomiting).  30 tablet  1  . prochlorperazine (COMPAZINE) 25 MG suppository Place 1 suppository (25 mg total) rectally every 12 (twelve) hours as needed for nausea.  12 suppository  3   No current facility-administered medications for this visit.   Facility-Administered Medications Ordered in Other Visits  Medication Dose Route  Frequency Provider Last Rate Last Dose  . lidocaine-prilocaine (EMLA) cream   Topical Once Pierce Crane, MD        Allergies:  Allergies  Allergen Reactions  . Adhesive (Tape) Itching and Rash    Rash and itching at steristrip and  EKG lead pad sites    Physical Exam: Filed Vitals:   04/26/12 0857  BP: 104/70  Pulse: 94  Temp: 98.7 F (37.1 C)    Body mass index is 24.53 kg/(m^2). Weight: 161 lbs. HEENT:  Sclerae anicteric, conjunctivae pink.  Oropharynx clear.  No mucositis or candidiasis.   Nodes:  No cervical, supraclavicular, or axillary lymphadenopathy palpated.  Breast Exam: Performed by Dr. Pierce Crane, the left breast does show significant softening, without any dominant mass effect. No erythema is present. Lungs:  Clear to auscultation bilaterally.  No crackles, rhonchi, or wheezes.   Heart:  Regular rate and rhythm.   Abdomen:  Soft, nontender.  Positive bowel sounds.  No organomegaly or masses palpated.   Musculoskeletal:  No focal spinal tenderness to palpation.  Extremities:  Benign.  No peripheral edema or cyanosis.   Skin: Over the anterior left forearm,  the area previously noted to have ecchymoses and possible superficial thrombophlebitis appears to be improving.  No evidence of skin breakdown.   Neuro:  Nonfocal, alert and oriented x 3.   Lab Results: Lab Results  Component Value Date   WBC 2.6* 04/26/2012   HGB 9.6* 04/26/2012   HCT 28.8* 04/26/2012   MCV 99.7 04/26/2012   PLT 141* 04/26/2012   NEUTROABS 1.3* 04/26/2012     Chemistry      Component Value Date/Time   NA 142 04/05/2012 1109   K 4.0 04/05/2012 1109   CL 108 04/05/2012 1109   CO2 24 04/05/2012 1109   BUN 13 04/05/2012 1109   CREATININE 0.84 04/05/2012 1109      Component Value Date/Time   CALCIUM 8.6 04/05/2012 1109   ALKPHOS 80 04/05/2012 1109   AST 18 04/05/2012 1109   ALT 13 04/05/2012 1109   BILITOT 0.4 04/05/2012 1109      Lab Results  Component Value Date   LABCA2 56* 03/08/2012    Assessment:  Sydney Cervantes is a 51 year old [place] woman with a locally advanced, clinical stage T4, N3, infiltrating lobular carcinoma of the left breast with lymph node involvement and diffuse skin thickening. ER/PR positive  at 63/5% respectively, HER-2 negative, elevated Ki-67. For day 1 cycle 5/6 planned neoadjuvant dose dense FEC with Neulasta support on day 2. 20% dose reduction on 5-FU, 50% dose reduction on epirubicin, 40% dose reduction on Cytoxan, after one-week delay due to anemia/thrombocytopenia/and region of probable localized thrombophlebitis versus mild chemotherapy extravasation on the left forearm, covered with Keflex 500 mg by mouth 3 times a day.  2. History of  severe neutropenia with associated fever, despite Neulasta, covered with Cipro 500mg  po BID, Augmentin 875mg  po BID, and 3 doses IV Rocephin 2.0gm on 4/10-4/12/13, now with normalization of WBC.  3. Area on left forearm suggesting possible chemotherapy extravasation versus localized thrombophlebitis.  4. Mildly symptomatic anemia, asymptomatic thrombocytopenia chemotherapy-induced.   Case reviewed with Dr. Pierce Crane, who also examined patient.  Plan:  We will switch to single agent Taxol, given in the neoadjuvant setting for 3 weeks on one week off. Of note, patient is scheduled to see her surgeon, Dr. Ezzard Standing tomorrow for followup. We will also plan on obtaining breast MRI to radiographically confirm what we're noting  clinically. She'll be seen in one week's time prior to week 2 cycle 1 of single agent Taxol, side effect profile has been reviewed, and she agrees with our plan. She knows to call with any changes or problems.    Jovee Dettinger T, PA-C 04/26/2012

## 2012-04-26 NOTE — Progress Notes (Signed)
1333 Taxol initiated at 58ml/hr x 19ml. VSS patient with no complaints. 1338 VSS (98.6, 82, 18, 95/63) patient with no complaints.  1343 VSS (98.5, 88, 18, 99/67) patient with no complaints. 1348 VSS (98.8, 88, 17, 106/71) patient with no complaints. Rate increased to 131ml/hr x 38 ml 1408 VSS (98.8, 88, 17, 102/71) patient with no complaints. Rate increased to 264ml/hr x 57 ml 1428 VSS (98.4, 88, 17, 105/72) patient with no complaints. Rate increased to max 327ml/hr x 76ml 1508 Taxol infusion complete. VSS (98.4, 87, 18, 102/72) Patient with no complaints.

## 2012-04-26 NOTE — Telephone Encounter (Signed)
gve the pt her may,june 2013 appt calendar. Pt is aware michelle will make the chemo changes. Sent michelle a staff message regarding this

## 2012-04-26 NOTE — Patient Instructions (Signed)
[facility] Discharge Instructions for Patients Receiving Chemotherapy  Today you received the following chemotherapy agent Taxol.  To help prevent nausea and vomiting after your treatment, we encourage you to take your nausea medication. Begin taking it as often as prescribed for by Dr. Donnie Coffin.    If you develop nausea and vomiting that is not controlled by your nausea medication, call the clinic [phone]. If it is after clinic hours your family physician or the after hours number for the clinic or go to the Emergency Department.   BELOW ARE SYMPTOMS THAT SHOULD BE REPORTED IMMEDIATELY:  *FEVER GREATER THAN 100.5 F  *CHILLS WITH OR WITHOUT FEVER  NAUSEA AND VOMITING THAT IS NOT CONTROLLED WITH YOUR NAUSEA MEDICATION  *UNUSUAL SHORTNESS OF BREATH  *UNUSUAL BRUISING OR BLEEDING  TENDERNESS IN MOUTH AND THROAT WITH OR WITHOUT PRESENCE OF ULCERS  *URINARY PROBLEMS  *BOWEL PROBLEMS  UNUSUAL RASH Items with * indicate a potential emergency and should be followed up as soon as possible.  One of the nurses will contact you 24 hours after your treatment. Please let the nurse know about any problems that you may have experienced. Feel free to call the clinic you have any questions or concerns. The clinic phone number is [phone].   I have been informed and understand all the instructions given to me. I know to contact the clinic, my physician, or go to the Emergency Department if any problems should occur. I do not have any questions at this time, but understand that I may call the clinic during office hours   should I have any questions or need assistance in obtaining follow up care.    __________________________________________  _____________  __________ Signature of Patient or Authorized Representative            Date                   Time    __________________________________________ Nurse's Signature

## 2012-04-27 ENCOUNTER — Ambulatory Visit: Payer: PRIVATE HEALTH INSURANCE

## 2012-04-27 ENCOUNTER — Encounter (INDEPENDENT_AMBULATORY_CARE_PROVIDER_SITE_OTHER): Payer: Self-pay | Admitting: Surgery

## 2012-04-27 ENCOUNTER — Ambulatory Visit (INDEPENDENT_AMBULATORY_CARE_PROVIDER_SITE_OTHER): Payer: PRIVATE HEALTH INSURANCE | Admitting: Surgery

## 2012-04-27 VITALS — BP 100/70 | HR 72 | Temp 97.9°F | Resp 18 | Ht 68.0 in | Wt 157.4 lb

## 2012-04-27 DIAGNOSIS — C50519 Malignant neoplasm of lower-outer quadrant of unspecified female breast: Secondary | ICD-10-CM

## 2012-04-27 NOTE — Progress Notes (Signed)
Re:   Sydney Cervantes DOB:   04-30-1961 MRN:   [id number]  [facility]  ASSESSMENT AND PLAN: 1.  Left breast cancer.  T4, N3, Mx, at 11 o'clock.  [place] - 4.5 cm, MRI - 7.6 cm extending to pectoralis muscle.  Lobular, grade 3.  ER - 62%, PR - 5%, Her@Neu  - neg., Ki67 - 33  Left axillary node biopsy positive.  Treating oncology - Rubin/Wentworth.  She just started Taxol this week.  I did do an [place] which is in the chart.  She seems to have about a 20% response to chemotx by my ultrasound.  I will see her back in 12 weeks.   2.  Level 1, 2 and 3 nodes positive - by MRI. 3.  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  4.  Power port - 02/11/2012.  There was a question on one treatment of problems, but the contrast study of the port was okay. 5.  BRCA 1/2 neg - April 2013. 6.  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 7.  Healing left forearm wound from extravasation.   REFERRING PHYSICIAN:  Dag Pavic, [facility].  HISTORY OF PRESENT ILLNESS: Sydney Cervantes is a 51 y.o. (DOB: Jul 11, 1961)  whtie female whose primary care physician is Debbora Dus, NP, [facility], and comes to me today for follow up of left breast cancert.  She is getting neoadjuvant therapy.  She just started Taxol this week.  She did have some extravasation of the left arm when they gave her the chemotx by vein.  They are back to using the power port.  She feels okay.  No new issues.  She is for a MRI in the next week.  Her mother had breast cancer in her 7's.  She then had a recurrence.  She is still living.      Past Medical History  Diagnosis Date  . Hx of colonoscopy 2000  . Wears glasses   . Cancer 01/2012    L breast cancer     Current Outpatient Prescriptions  Medication Sig Dispense Refill  . acetaminophen (TYLENOL) 500 MG tablet Take 500 mg by mouth every 6 (six) hours as needed. For pain/headaches.      . dexamethasone (DECADRON) 4 MG tablet Take 2 tablets by mouth once a day on the day after  chemotherapy and then take 2 tablets two times a day for 2 days. Take with food.  30 tablet  1  . lidocaine-prilocaine (EMLA) cream Apply topically as needed.  30 g  1  . LORazepam (ATIVAN) 0.5 MG tablet Take 1 tablet (0.5 mg total) by mouth every 6 (six) hours as needed (Nausea or vomiting).  30 tablet  0  . ondansetron (ZOFRAN) 8 MG tablet Take 1 tablet two times a day as needed for nausea or vomiting starting on the third day after chemotherapy.  30 tablet  1  . prochlorperazine (COMPAZINE) 10 MG tablet Take 1 tablet (10 mg total) by mouth every 6 (six) hours as needed (Nausea or vomiting).  30 tablet  1  . prochlorperazine (COMPAZINE) 25 MG suppository Place 1 suppository (25 mg total) rectally every 12 (twelve) hours as needed for nausea.  12 suppository  3   No current facility-administered medications for this visit.   Facility-Administered Medications Ordered in Other Visits  Medication Dose Route Frequency Provider Last Rate Last Dose  . 0.9 %  sodium chloride infusion   Intravenous Once Christine T  Scherer, PA      . dexamethasone (DECADRON) injection 10 mg  10 mg Intravenous Once Amada Kingfisher, PA   10 mg at 04/26/12 1132  . diphenhydrAMINE (BENADRYL) injection 50 mg  50 mg Intravenous Once Amada Kingfisher, PA   50 mg at 04/26/12 1132  . famotidine (PEPCID) IVPB 20 mg  20 mg Intravenous Once Amada Kingfisher, PA   20 mg at 04/26/12 1154  . heparin lock flush 100 unit/mL  500 Units Intracatheter Once PRN Amada Kingfisher, PA   500 Units at 04/26/12 1512  . lidocaine-prilocaine (EMLA) cream   Topical Once Pierce Crane, MD      . ondansetron [facility]) IVPB 8 mg  8 mg Intravenous Once Amada Kingfisher, PA   8 mg at 04/26/12 1132  . PACLitaxel (TAXOL) 150 mg in dextrose 5 % 250 mL chemo infusion (</= 80mg /m2)  80 mg/m2 (Treatment Plan Actual) Intravenous Once Amada Kingfisher, PA   150 mg at 04/26/12 1428  . DISCONTD: sodium chloride 0.9 % injection 10 mL  10 mL  Intracatheter PRN Amada Kingfisher, PA   10 mL at 04/26/12 1512     Allergies  Allergen Reactions  . Adhesive (Tape) Itching and Rash    Rash and itching at steristrip and EKG lead pad sites    REVIEW OF SYSTEMS:  Gastrointestinal: Lap chole - 2000's by Dr. Carolynne Edouard, Appendiceal carcinoid - 1987, last colonoscopy about 2000.  SOCIAL and FAMILY HISTORY: Married.  Husband, [name], with patient. They have 3 daughters - 22,19, 27. She works for [employer] for [employer].  PHYSICAL EXAM: There were no vitals taken for this visit.  General: WN WF who is alert and generally healthy appearing.  HEENT: Normal. Pupils equal. Good dentition. Neck: Supple. No mass.  No thyroid mass.   Lymph Nodes:  No supraclavicular, cervical, or axillary nodes.  I am not sure I can feel a positive note. Breasts:  Left:  Left breast mass - 3+cm with dimpling at 5 o'clock position.  While in the office, I did an US of the left breast to try to document the size of the tumor. The overall tumor feels smaller to me and the dimpling less prominent.  Right - unremarkable.  DATA REVIEWED: Personnel officer notes.  Ovidio Kin, MD,  [facility] [address] [place]  [address]   [place], [place]    [address] Phone:  [phone] FAX:  [phone]

## 2012-05-03 ENCOUNTER — Other Ambulatory Visit (HOSPITAL_BASED_OUTPATIENT_CLINIC_OR_DEPARTMENT_OTHER): Payer: PRIVATE HEALTH INSURANCE | Admitting: Lab

## 2012-05-03 ENCOUNTER — Ambulatory Visit (HOSPITAL_BASED_OUTPATIENT_CLINIC_OR_DEPARTMENT_OTHER): Payer: PRIVATE HEALTH INSURANCE

## 2012-05-03 ENCOUNTER — Telehealth: Payer: Self-pay | Admitting: *Deleted

## 2012-05-03 ENCOUNTER — Ambulatory Visit (HOSPITAL_BASED_OUTPATIENT_CLINIC_OR_DEPARTMENT_OTHER): Payer: PRIVATE HEALTH INSURANCE | Admitting: Oncology

## 2012-05-03 VITALS — BP 99/69 | HR 121 | Temp 99.1°F | Ht 68.0 in | Wt 158.4 lb

## 2012-05-03 DIAGNOSIS — C50519 Malignant neoplasm of lower-outer quadrant of unspecified female breast: Secondary | ICD-10-CM

## 2012-05-03 DIAGNOSIS — Z170 Estrogen receptor positive status [ER+]: Secondary | ICD-10-CM

## 2012-05-03 DIAGNOSIS — I998 Other disorder of circulatory system: Secondary | ICD-10-CM

## 2012-05-03 DIAGNOSIS — D6481 Anemia due to antineoplastic chemotherapy: Secondary | ICD-10-CM

## 2012-05-03 DIAGNOSIS — Z5111 Encounter for antineoplastic chemotherapy: Secondary | ICD-10-CM

## 2012-05-03 LAB — COMPREHENSIVE METABOLIC PANEL
ALT: 47 U/L — ABNORMAL HIGH (ref 0–35)
AST: 40 U/L — ABNORMAL HIGH (ref 0–37)
Albumin: 4.3 g/dL (ref 3.5–5.2)
Alkaline Phosphatase: 67 U/L (ref 39–117)
Calcium: 8.9 mg/dL (ref 8.4–10.5)
Chloride: 107 mEq/L (ref 96–112)
Potassium: 3.7 mEq/L (ref 3.5–5.3)
Sodium: 142 mEq/L (ref 135–145)

## 2012-05-03 LAB — CBC WITH DIFFERENTIAL/PLATELET
BASO%: 0.5 % (ref 0.0–2.0)
EOS%: 2.6 % (ref 0.0–7.0)
HGB: 9.1 g/dL — ABNORMAL LOW (ref 11.6–15.9)
MCH: 33.6 pg (ref 25.1–34.0)
MCHC: 34.2 g/dL (ref 31.5–36.0)
RBC: 2.71 10*6/uL — ABNORMAL LOW (ref 3.70–5.45)
RDW: 18.9 % — ABNORMAL HIGH (ref 11.2–14.5)
lymph#: 0.9 10*3/uL (ref 0.9–3.3)

## 2012-05-03 MED ORDER — FAMOTIDINE IN NACL 20-0.9 MG/50ML-% IV SOLN
20.0000 mg | Freq: Once | INTRAVENOUS | Status: AC
  Administered 2012-05-03: 20 mg via INTRAVENOUS

## 2012-05-03 MED ORDER — ONDANSETRON 8 MG/50ML IVPB (CHCC)
8.0000 mg | Freq: Once | INTRAVENOUS | Status: AC
  Administered 2012-05-03: 8 mg via INTRAVENOUS

## 2012-05-03 MED ORDER — SODIUM CHLORIDE 0.9 % IV SOLN
Freq: Once | INTRAVENOUS | Status: AC
  Administered 2012-05-03: 15:00:00 via INTRAVENOUS

## 2012-05-03 MED ORDER — DIPHENHYDRAMINE HCL 50 MG/ML IJ SOLN
50.0000 mg | Freq: Once | INTRAMUSCULAR | Status: AC
  Administered 2012-05-03: 50 mg via INTRAVENOUS

## 2012-05-03 MED ORDER — PACLITAXEL CHEMO INJECTION 300 MG/50ML
80.0000 mg/m2 | Freq: Once | INTRAVENOUS | Status: AC
  Administered 2012-05-03: 150 mg via INTRAVENOUS
  Filled 2012-05-03: qty 25

## 2012-05-03 MED ORDER — DEXAMETHASONE SODIUM PHOSPHATE 10 MG/ML IJ SOLN
10.0000 mg | Freq: Once | INTRAMUSCULAR | Status: AC
  Administered 2012-05-03: 10 mg via INTRAVENOUS

## 2012-05-03 MED ORDER — HEPARIN SOD (PORK) LOCK FLUSH 100 UNIT/ML IV SOLN
500.0000 [IU] | Freq: Once | INTRAVENOUS | Status: AC | PRN
  Administered 2012-05-03: 500 [IU]
  Filled 2012-05-03: qty 5

## 2012-05-03 MED ORDER — SODIUM CHLORIDE 0.9 % IJ SOLN
10.0000 mL | INTRAMUSCULAR | Status: DC | PRN
  Administered 2012-05-03: 10 mL
  Filled 2012-05-03: qty 10

## 2012-05-03 NOTE — Patient Instructions (Signed)
[facility] Discharge Instructions for Patients Receiving Chemotherapy  Today you received the following chemotherapy agents Taxol  To help prevent nausea and vomiting after your treatment, we encourage you to take your nausea medication as prescribed.  If you develop nausea and vomiting that is not controlled by your nausea medication, call the clinic. If it is after clinic hours your family physician or the after hours number for the clinic or go to the Emergency Department.   BELOW ARE SYMPTOMS THAT SHOULD BE REPORTED IMMEDIATELY:  *FEVER GREATER THAN 100.5 F  *CHILLS WITH OR WITHOUT FEVER  NAUSEA AND VOMITING THAT IS NOT CONTROLLED WITH YOUR NAUSEA MEDICATION  *UNUSUAL SHORTNESS OF BREATH  *UNUSUAL BRUISING OR BLEEDING  TENDERNESS IN MOUTH AND THROAT WITH OR WITHOUT PRESENCE OF ULCERS  *URINARY PROBLEMS  *BOWEL PROBLEMS  UNUSUAL RASH Items with * indicate a potential emergency and should be followed up as soon as possible.  One of the nurses will contact you 24 hours after your treatment. Please let the nurse know about any problems that you may have experienced. Feel free to call the clinic you have any questions or concerns. The clinic phone number is [phone].   I have been informed and understand all the instructions given to me. I know to contact the clinic, my physician, or go to the Emergency Department if any problems should occur. I do not have any questions at this time, but understand that I may call the clinic during office hours   should I have any questions or need assistance in obtaining follow up care.    __________________________________________  _____________  __________ Signature of Patient or Authorized Representative            Date                   Time    __________________________________________ Nurse's Signature    

## 2012-05-03 NOTE — Progress Notes (Signed)
Hematology and Oncology Follow Up Visit  Sydney Cervantes [id number] [birth year] 51 y.o. 05/03/2012    HPI: Sydney Cervantes is a 51 year old [place] woman with a locally advanced, clinical stage T4, N3, infiltrating lobular carcinoma of the left breast with lymph node involvement and diffuse skin thickening. ER/PR positive at 63/5% respectively, HER-2 negative, elevated Ki-67. For day 1 cycle 2  planned neoadjuvant weekly taxol 3/4,  after one-week delay due to anemia/thrombocytopenia/and region of probable localized thrombophlebitis versus mild chemotherapy extravasation on the left forearm, covered with Keflex 500 mg by mouth 3 times a day.  2. History of  severe neutropenia with associated fever, despite Neulasta, covered with Cipro 500mg  po BID, Augmentin 875mg  po BID, and 3 doses IV Rocephin 2.0gm on 4/10-4/12/13, now with normalization of WBC.  3. Area on left forearm suggesting possible chemotherapy extravasation versus localized thrombophlebitis.  4. Mildly symptomatic anemia, asymptomatic thrombocytopenia chemotherapy-induced.   Interim History:   Sydney Cervantes is seen today with her husband in accompaniment for followup in anticipation of day 1 cycle 2 of neoadjuvant weekly taxol . She is fatigued, she denies any unexplained fevers or chills though. No shortness of breath or chest pain. No nausea, emesis, diarrhea or constipation problems. She feels that her breast continues to feel soft.  The region on her left forearm is slowly improving. .A detailed review of systems is otherwise noncontributory as noted below.  Review of Systems: Constitutional:  no weight loss, fever, night sweats and feels well Eyes: uses glasses ENT: no complaints Cardiovascular: no chest pain or dyspnea on exertion Respiratory: no cough, shortness of breath, or wheezing Neurological: no TIA or stroke symptoms Dermatological: negative Gastrointestinal: no abdominal pain, change in bowel habits, or black or  bloody stools Genito-Urinary: no dysuria, trouble voiding, or hematuria Hematological and Lymphatic: negative Breast: positive for - known L breast mass Musculoskeletal: negative Remaining ROS negative.   Medications:   I have reviewed the patient's current medications.  Current Outpatient Prescriptions  Medication Sig Dispense Refill  . acetaminophen (TYLENOL) 500 MG tablet Take 500 mg by mouth every 6 (six) hours as needed. For pain/headaches.      . dexamethasone (DECADRON) 4 MG tablet Take 2 tablets by mouth once a day on the day after chemotherapy and then take 2 tablets two times a day for 2 days. Take with food.  30 tablet  1  . lidocaine-prilocaine (EMLA) cream Apply topically as needed.  30 g  1  . LORazepam (ATIVAN) 0.5 MG tablet Take 1 tablet (0.5 mg total) by mouth every 6 (six) hours as needed (Nausea or vomiting).  30 tablet  0  . ondansetron (ZOFRAN) 8 MG tablet Take 1 tablet two times a day as needed for nausea or vomiting starting on the third day after chemotherapy.  30 tablet  1  . prochlorperazine (COMPAZINE) 10 MG tablet Take 1 tablet (10 mg total) by mouth every 6 (six) hours as needed (Nausea or vomiting).  30 tablet  1  . prochlorperazine (COMPAZINE) 25 MG suppository Place 1 suppository (25 mg total) rectally every 12 (twelve) hours as needed for nausea.  12 suppository  3   No current facility-administered medications for this visit.   Facility-Administered Medications Ordered in Other Visits  Medication Dose Route Frequency Provider Last Rate Last Dose  . lidocaine-prilocaine (EMLA) cream   Topical Once Pierce Crane, MD        Allergies:  Allergies  Allergen Reactions  . Adhesive (Tape) Itching and Rash  Rash and itching at steristrip and EKG lead pad sites    Physical Exam: Filed Vitals:   05/03/12 1337  BP: 99/69  Pulse: 121  Temp: 99.1 F (37.3 C)    Body mass index is 24.08 kg/(m^2). Weight: 161 lbs. HEENT:  Sclerae anicteric, conjunctivae  pink.  Oropharynx clear.  No mucositis or candidiasis.   Nodes:  No cervical, supraclavicular, or axillary lymphadenopathy palpated.  Breast Exam: Performed by Dr. Pierce Crane, the left breast does show significant softening, without any dominant mass effect. No erythema is present. Lungs:  Clear to auscultation bilaterally.  No crackles, rhonchi, or wheezes.   Heart:  Regular rate and rhythm.   Abdomen:  Soft, nontender.  Positive bowel sounds.  No organomegaly or masses palpated.   Musculoskeletal:  No focal spinal tenderness to palpation.  Extremities:  Benign.  No peripheral edema or cyanosis.   Skin: Over the anterior left forearm,  the area previously noted to have ecchymoses and possible superficial thrombophlebitis appears to be improving.  No evidence of skin breakdown.   Neuro:  Nonfocal, alert and oriented x 3.   Lab Results: Lab Results  Component Value Date   WBC 5.8 05/03/2012   HGB 9.1* 05/03/2012   HCT 26.6* 05/03/2012   MCV 98.2 05/03/2012   PLT 159 05/03/2012   NEUTROABS 4.2 05/03/2012     Chemistry      Component Value Date/Time   NA 143 04/26/2012 0904   K 4.0 04/26/2012 0904   CL 109 04/26/2012 0904   CO2 28 04/26/2012 0904   BUN 11 04/26/2012 0904   CREATININE 0.61 04/26/2012 0904      Component Value Date/Time   CALCIUM 9.1 04/26/2012 0904   ALKPHOS 65 04/26/2012 0904   AST 26 04/26/2012 0904   ALT 28 04/26/2012 0904   BILITOT 0.7 04/26/2012 0904      Lab Results  Component Value Date   LABCA2 56* 03/08/2012    Assessment:  Sydney Cervantes is a 51 year old [place] woman with a locally advanced, clinical stage T4, N3, infiltrating lobular carcinoma of the left breast with lymph node involvement and diffuse skin thickening. ER/PR positive at 63/5% respectively, HER-2 negative, elevated Ki-67.Area of thrombophlebitis versus mild chemotherapy extravasation on the left forearm, covered with Keflex 500 mg by mouth 3 times a day.  2. History of  severe  neutropenia with associated fever, despite Neulasta, covered with Cipro 500mg  po BID, Augmentin 875mg  po BID, and 3 doses IV Rocephin 2.0gm on 4/10-4/12/13, now with normalization of WBC.  3. Area on left forearm suggesting possible chemotherapy extravasation versus localized thrombophlebitis.  4. Mildly symptomatic anemia, asymptomatic thrombocytopenia chemotherapy-induced.     Plan:  We will switch to single agent Taxol, given in the neoadjuvant setting for 3 weeks on one week off. We will also plan on obtaining breast MRI to radiographically confirm what we're noting clinically. She'll be seen in one week's time prior to week 3 cycle 1 of single agent Taxol, side effect profile has been reviewed, and she agrees with our plan. She knows to call with any changes or problems.    Pierce Crane, MD 05/03/2012

## 2012-05-03 NOTE — Telephone Encounter (Signed)
patient came in today 05-03-2012 stated she can not do the mri of the breast at [facility] long she needs an open machine I placed a call to [facility] at the breast center and she stated she will call the patient and set up the appointment with an open machine

## 2012-05-04 ENCOUNTER — Inpatient Hospital Stay (HOSPITAL_COMMUNITY): Admission: RE | Admit: 2012-05-04 | Payer: PRIVATE HEALTH INSURANCE | Source: Ambulatory Visit

## 2012-05-04 ENCOUNTER — Ambulatory Visit: Payer: PRIVATE HEALTH INSURANCE

## 2012-05-06 ENCOUNTER — Ambulatory Visit
Admission: RE | Admit: 2012-05-06 | Discharge: 2012-05-06 | Disposition: A | Discharge status: Home / Self Care | Payer: PRIVATE HEALTH INSURANCE | Source: Ambulatory Visit | Attending: Physician Assistant | Admitting: Physician Assistant

## 2012-05-06 DIAGNOSIS — C50519 Malignant neoplasm of lower-outer quadrant of unspecified female breast: Secondary | ICD-10-CM

## 2012-05-06 MED ORDER — GADOBENATE DIMEGLUMINE 529 MG/ML IV SOLN
14.0000 mL | Freq: Once | INTRAVENOUS | Status: AC | PRN
  Administered 2012-05-06: 14 mL via INTRAVENOUS

## 2012-05-08 NOTE — Progress Notes (Signed)
Hematology and Oncology Follow Up Visit  Sydney Cervantes [id number] Jan 06, 1961 51 y.o. 05/08/2012 8:10 PM   DIAGNOSIS: Breast cancer  Encounter Diagnosis  Name Primary?  . Cancer of lower-outer quadrant of female breast, Left, T4, N3. For neoadjuvant tx. Yes     PAST THERAPY: none   Interim History:  Patient returns for followup. She had surgery on 02/11/2012 for port placement. She had a PET scan on 02/12/2012.. Large left hypermetabolic breast mass consistent with known  breast cancer.  2. FDG positive left axillary, left subpectoral, left internal  mammary and right supraclavicular lymphadenopathy.  3. No findings for metastatic disease involving the lungs,  abdomen, pelvis or osseous structures.  She apparently has locally advanced disease including the right supraclavicular adenopathy which is positive on PET. These had chemotherapy teaching and she is anxious to proceed with therapy. We once again went through her antiemetic coverage and use of steroids.  Medications: I have reviewed the patient's current medications.  Allergies:  Allergies  Allergen Reactions  . Adhesive (Tape) Itching and Rash    Rash and itching at steristrip and EKG lead pad sites    Past Medical History, Surgical history, Social history, and Family History were reviewed and updated.  Review of Systems: Constitutional:  Negative for fever, chills, night sweats, anorexia, weight loss, pain. Cardiovascular: no chest pain or dyspnea on exertion Respiratory: no cough, shortness of breath, or wheezing Neurological: no TIA or stroke symptoms Dermatological: negative ENT: negative Skin Gastrointestinal: negative Genito-Urinary: negative Hematological and Lymphatic: negative Breast: negative Musculoskeletal: negative Remaining ROS negative.  Physical Exam:  Blood pressure 121/79, pulse 111, temperature 98.6 F (37 C), temperature source Oral, height 5\' 8"  (1.727 m), weight 164 lb 4.8 oz (74.526  kg), last menstrual period 01/07/2012.  ECOG: 0 HEENT:  Sclerae anicteric, conjunctivae pink.  Oropharynx clear.  No mucositis or candidiasis.  Nodes:  No cervical, supraclavicular, or axillary lymphadenopathy palpated.  Breast Exam:  Right breast is benign.  No masses, discharge, skin change, or nipple inversion. Large left breast mass as previously described  No masses, discharge, skin change, or nipple inversion..  Lungs:  Clear to auscultation bilaterally.  No crackles, rhonchi, or wheezes.  Heart:  Regular rate and rhythm.  Abdomen:  Soft, nontender.  Positive bowel sounds.  No organomegaly or masses palpated.  Musculoskeletal:  No focal spinal tenderness to palpation.  Extremities:  Benign.  No peripheral edema or cyanosis.  Skin:  Benign.  Neuro:  Nonfocal.      Lab Results: Lab Results  Component Value Date   WBC 5.8 05/03/2012   HGB 9.1* 05/03/2012   HCT 26.6* 05/03/2012   MCV 98.2 05/03/2012   PLT 159 05/03/2012     Chemistry      Component Value Date/Time   NA 142 05/03/2012 1322   K 3.7 05/03/2012 1322   CL 107 05/03/2012 1322   CO2 26 05/03/2012 1322   BUN 13 05/03/2012 1322   CREATININE 0.69 05/03/2012 1322      Component Value Date/Time   CALCIUM 8.9 05/03/2012 1322   ALKPHOS 67 05/03/2012 1322   AST 40* 05/03/2012 1322   ALT 47* 05/03/2012 1322   BILITOT 0.5 05/03/2012 1322       Radiological Studies:    IMPRESSIONS AND PLAN: A 51 y.o. female with   Locally advanced breast cancer. She will begin chemotherapy as soon as possible. In all likelihood she will still need to have a mastectomy.  Spent more than half  the time coordinating care.    Edwin Cherian 6/2/20138:10 PM

## 2012-05-09 ENCOUNTER — Ambulatory Visit: Payer: PRIVATE HEALTH INSURANCE | Admitting: Physician Assistant

## 2012-05-10 ENCOUNTER — Other Ambulatory Visit (HOSPITAL_BASED_OUTPATIENT_CLINIC_OR_DEPARTMENT_OTHER): Payer: PRIVATE HEALTH INSURANCE | Admitting: Lab

## 2012-05-10 ENCOUNTER — Telehealth: Payer: Self-pay | Admitting: *Deleted

## 2012-05-10 ENCOUNTER — Other Ambulatory Visit: Payer: PRIVATE HEALTH INSURANCE

## 2012-05-10 ENCOUNTER — Ambulatory Visit (HOSPITAL_BASED_OUTPATIENT_CLINIC_OR_DEPARTMENT_OTHER): Payer: PRIVATE HEALTH INSURANCE | Admitting: Physician Assistant

## 2012-05-10 ENCOUNTER — Encounter: Payer: Self-pay | Admitting: Physician Assistant

## 2012-05-10 ENCOUNTER — Ambulatory Visit (HOSPITAL_BASED_OUTPATIENT_CLINIC_OR_DEPARTMENT_OTHER): Payer: PRIVATE HEALTH INSURANCE

## 2012-05-10 VITALS — BP 100/67 | HR 109 | Temp 98.5°F | Ht 68.0 in | Wt 160.7 lb

## 2012-05-10 DIAGNOSIS — C50519 Malignant neoplasm of lower-outer quadrant of unspecified female breast: Secondary | ICD-10-CM

## 2012-05-10 DIAGNOSIS — Z5111 Encounter for antineoplastic chemotherapy: Secondary | ICD-10-CM

## 2012-05-10 LAB — CBC WITH DIFFERENTIAL/PLATELET
BASO%: 0.7 % (ref 0.0–2.0)
Eosinophils Absolute: 0.1 10*3/uL (ref 0.0–0.5)
HCT: 25.3 % — ABNORMAL LOW (ref 34.8–46.6)
LYMPH%: 20.1 % (ref 14.0–49.7)
MCHC: 34.4 g/dL (ref 31.5–36.0)
MONO#: 0.2 10*3/uL (ref 0.1–0.9)
NEUT#: 2.1 10*3/uL (ref 1.5–6.5)
NEUT%: 67.3 % (ref 38.4–76.8)
Platelets: 179 10*3/uL (ref 145–400)
WBC: 3 10*3/uL — ABNORMAL LOW (ref 3.9–10.3)
lymph#: 0.6 10*3/uL — ABNORMAL LOW (ref 0.9–3.3)
nRBC: 1 % — ABNORMAL HIGH (ref 0–0)

## 2012-05-10 LAB — COMPREHENSIVE METABOLIC PANEL
ALT: 35 U/L (ref 0–35)
AST: 27 U/L (ref 0–37)
Albumin: 4.1 g/dL (ref 3.5–5.2)
CO2: 24 mEq/L (ref 19–32)
Calcium: 8.9 mg/dL (ref 8.4–10.5)
Chloride: 108 mEq/L (ref 96–112)
Potassium: 3.8 mEq/L (ref 3.5–5.3)
Total Protein: 5.5 g/dL — ABNORMAL LOW (ref 6.0–8.3)

## 2012-05-10 LAB — LACTATE DEHYDROGENASE: LDH: 208 U/L (ref 94–250)

## 2012-05-10 MED ORDER — ONDANSETRON 8 MG/50ML IVPB (CHCC)
8.0000 mg | Freq: Once | INTRAVENOUS | Status: AC
  Administered 2012-05-10: 8 mg via INTRAVENOUS

## 2012-05-10 MED ORDER — DEXAMETHASONE SODIUM PHOSPHATE 10 MG/ML IJ SOLN
10.0000 mg | Freq: Once | INTRAMUSCULAR | Status: AC
  Administered 2012-05-10: 10 mg via INTRAVENOUS

## 2012-05-10 MED ORDER — SODIUM CHLORIDE 0.9 % IJ SOLN
10.0000 mL | INTRAMUSCULAR | Status: DC | PRN
  Administered 2012-05-10: 10 mL
  Filled 2012-05-10: qty 10

## 2012-05-10 MED ORDER — SODIUM CHLORIDE 0.9 % IV SOLN
Freq: Once | INTRAVENOUS | Status: AC
  Administered 2012-05-10: 10:00:00 via INTRAVENOUS

## 2012-05-10 MED ORDER — PACLITAXEL CHEMO INJECTION 300 MG/50ML
80.0000 mg/m2 | Freq: Once | INTRAVENOUS | Status: AC
  Administered 2012-05-10: 150 mg via INTRAVENOUS
  Filled 2012-05-10: qty 25

## 2012-05-10 MED ORDER — HEPARIN SOD (PORK) LOCK FLUSH 100 UNIT/ML IV SOLN
500.0000 [IU] | Freq: Once | INTRAVENOUS | Status: AC | PRN
  Administered 2012-05-10: 500 [IU]
  Filled 2012-05-10: qty 5

## 2012-05-10 MED ORDER — FAMOTIDINE IN NACL 20-0.9 MG/50ML-% IV SOLN
20.0000 mg | Freq: Once | INTRAVENOUS | Status: AC
  Administered 2012-05-10: 20 mg via INTRAVENOUS

## 2012-05-10 MED ORDER — DIPHENHYDRAMINE HCL 50 MG/ML IJ SOLN
50.0000 mg | Freq: Once | INTRAMUSCULAR | Status: AC
  Administered 2012-05-10: 50 mg via INTRAVENOUS

## 2012-05-10 NOTE — Telephone Encounter (Signed)
made patient appointment for 05-24-2012 05-31-2012 06-07-2012 lab ml treatment sent michelle email thru outlook to set up patient chemo times

## 2012-05-10 NOTE — Progress Notes (Signed)
Hematology and Oncology Follow Up Visit  Sydney Cervantes [id number] [birth year] 51 y.o. 05/10/2012    HPI: Sydney Cervantes is a 51 year old [place] woman with a locally advanced, clinical stage T4, N3, infiltrating lobular carcinoma of the left breast with lymph node involvement and diffuse skin thickening. ER/PR positive at 63/5% respectively, HER-2 negative, elevated Ki-67.  She completed 4/6 planned neoadjuvant dose dense FEC with Neulasta support on day 2. 20% dose reduction on 5-FU, 50% dose reduction on epirubicin, 40% dose reduction on Cytoxan, after one-week delay due to anemia/thrombocytopenia/and region of probable localized thrombophlebitis versus mild chemotherapy extravasation on the left forearm, covered with Keflex 500 mg by mouth 3 times a day.  Switched to single agent neoadjuvant Taxol, given 3 weeks on 1 week off, planned 4 cycles, due for week 3, cycle 1 today.  2. History of  severe neutropenia with associated fever, despite Neulasta, covered with Cipro 500mg  po BID, Augmentin 875mg  po BID, and 3 doses IV Rocephin 2.0gm on 4/10-4/12/13, now with normalization of WBC.  3. Area on left forearm suggesting possible chemotherapy extravasation versus localized thrombophlebitis.  4. Mildly symptomatic anemia, asymptomatic thrombocytopenia chemotherapy-induced.   Interim History:   Sydney Cervantes is seen today with her husband in accompaniment for followup in anticipation of week 3 cycle 1/4 of neoadjuvant weekly Taxol. She is fatigued, she denies any unexplained fevers or chills though. No shortness of breath or chest pain. No nausea, emesis, diarrhea or constipation problems. She denies any neuropathy symptoms. No bleeding or bruising symptoms. She underwent a bilateral breast MRI on 05/06/2012.  A detailed review of systems is otherwise noncontributory as noted below.  Review of Systems: Constitutional:  no weight loss, fever, night sweats and feels well Eyes: uses glasses ENT: no  complaints Cardiovascular: no chest pain or dyspnea on exertion Respiratory: no cough, shortness of breath, or wheezing Neurological: no TIA or stroke symptoms Dermatological: negative Gastrointestinal: no abdominal pain, change in bowel habits, or black or bloody stools Genito-Urinary: no dysuria, trouble voiding, or hematuria Hematological and Lymphatic: negative Breast: positive for - known L breast mass Musculoskeletal: negative Remaining ROS negative.   Medications:   I have reviewed the patient's current medications.  Current Outpatient Prescriptions  Medication Sig Dispense Refill  . lidocaine-prilocaine (EMLA) cream Apply topically as needed.  30 g  1  . acetaminophen (TYLENOL) 500 MG tablet Take 500 mg by mouth every 6 (six) hours as needed. For pain/headaches.      Marland Kitchen LORazepam (ATIVAN) 0.5 MG tablet Take 1 tablet (0.5 mg total) by mouth every 6 (six) hours as needed (Nausea or vomiting).  30 tablet  0  . ondansetron (ZOFRAN) 8 MG tablet Take 1 tablet two times a day as needed for nausea or vomiting starting on the third day after chemotherapy.  30 tablet  1  . prochlorperazine (COMPAZINE) 10 MG tablet Take 1 tablet (10 mg total) by mouth every 6 (six) hours as needed (Nausea or vomiting).  30 tablet  1  . prochlorperazine (COMPAZINE) 25 MG suppository Place 1 suppository (25 mg total) rectally every 12 (twelve) hours as needed for nausea.  12 suppository  3    Allergies:  Allergies  Allergen Reactions  . Adhesive (Tape) Itching and Rash    Rash and itching at steristrip and EKG lead pad sites    Physical Exam: Filed Vitals:   05/10/12 0906  BP: 100/67  Pulse: 109  Temp: 98.5 F (36.9 C)    Body mass index is  24.43 kg/(m^2). Weight: 160 lbs. HEENT:  Sclerae anicteric, conjunctivae pink.  Oropharynx clear.  No mucositis or candidiasis.   Nodes:  No cervical, supraclavicular, or axillary lymphadenopathy palpated.  Breast Exam: Deferred. Lungs:  Clear to  auscultation bilaterally.  No crackles, rhonchi, or wheezes.   Heart:  Regular rate and rhythm.   Abdomen:  Soft, nontender.  Positive bowel sounds.  No organomegaly or masses palpated.   Musculoskeletal:  No focal spinal tenderness to palpation.  Extremities:  Benign.  No peripheral edema or cyanosis.   Skin: Over the anterior left forearm,  the area previously noted to have ecchymoses and possible superficial thrombophlebitis appears to be improving.  No evidence of skin breakdown.   Neuro:  Nonfocal, alert and oriented x 3.   Lab Results: Lab Results  Component Value Date   WBC 3.0* 05/10/2012   HGB 8.7* 05/10/2012   HCT 25.3* 05/10/2012   MCV 99.6 05/10/2012   PLT 179 05/10/2012   NEUTROABS 2.1 05/10/2012     Chemistry      Component Value Date/Time   NA 142 05/03/2012 1322   K 3.7 05/03/2012 1322   CL 107 05/03/2012 1322   CO2 26 05/03/2012 1322   BUN 13 05/03/2012 1322   CREATININE 0.69 05/03/2012 1322      Component Value Date/Time   CALCIUM 8.9 05/03/2012 1322   ALKPHOS 67 05/03/2012 1322   AST 40* 05/03/2012 1322   ALT 47* 05/03/2012 1322   BILITOT 0.5 05/03/2012 1322      Lab Results  Component Value Date   LABCA2 56* 03/08/2012   Radiographic data: 05/06/12 BILATERAL BREAST MRI WITH AND WITHOUT CONTRAST  Technique: Multiplanar, multisequence MR images of both breasts were obtained prior to and following the intravenous administration of 14ml of Multihance. Three dimensional images were evaluated at the independent DynaCad workstation.  Comparison: MRI 01/22/2012 Findings: There continues to be skin thickening and skin enhancement of the left breast. Diffuse abnormal enhancement is seen throughout the central portion of the left breast involving all four quadrants. Enhancement is significantly more ill-defined and less mass-like in appearance. Enhancement measures approximately 8.0 x 7.5 x 4.5 cm. Along the inferior aspect of the breast, a more mass-like area of enhancement measures 1.6  x 1.9 x 1.5 cm, also seen on the previous exam and adjacent to the clip artifact from prior biopsy. Despite the similar measurements to the previous exam, there has been significant improvement based on enhancement kinetics. The contour of the left breast is improved with less nipple retraction. Previously, there was enhancement on the anterior aspect of the left pectoralis muscle, no longer apparent.  Multiple enlarged left axillary lymph nodes are identified on the  prior study. Nodes are now normal in size and morphology in the  left axilla. On the right, background parenchymal enhancement is minimal. No suspicious enhancement is identified. Right axillary lymph nodes are normal in morphology. The patient has a right-sided Port-A- Cath.  IMPRESSION:  1. Significant improvement in the enhancement kinetics involving  the central aspect of the left breast.  2. Significant improvement in the contour of the left breast with  less nipple retraction.  3. Significant improvement in left axillary adenopathy. No  axillary lymph nodes appear suspicious on the current study.  4. There is no longer suspicious enhancement along the anterior  aspect of the left pectoralis muscle.  THREE-DIMENSIONAL MR IMAGE RENDERING ON INDEPENDENT WORKSTATION:  Three-dimensional MR images were rendered by post-processing of the original MR  data on an independent workstation. The three-  dimensional MR images were interpreted, and findings were reported in the accompanying complete MRI report for this study.  BI-RADS CATEGORY 6: Known biopsy-proven malignancy - appropriate action should be taken.  Original Report Authenticated By: Patterson Hammersmith, M.D.  Assessment:  Sydney Cervantes is a 51 year old [place] woman with a locally advanced, clinical stage T4, N3, infiltrating lobular carcinoma of the left breast with lymph node involvement and diffuse skin thickening. ER/PR positive at 63/5% respectively, HER-2  negative, elevated Ki-67.  She completed 4/6 planned neoadjuvant dose dense FEC with Neulasta support on day 2. 20% dose reduction on 5-FU, 50% dose reduction on epirubicin, 40% dose reduction on Cytoxan, after one-week delay due to anemia/thrombocytopenia/and region of probable localized thrombophlebitis versus mild chemotherapy extravasation on the left forearm, covered with Keflex 500 mg by mouth 3 times a day.  Switched to single agent neoadjuvant Taxol, given 3 weeks on 1 week off, planned 4 cycles, due for week 3, cycle 1 today.  2. History of  severe neutropenia with associated fever, despite Neulasta, covered with Cipro 500mg  po BID, Augmentin 875mg  po BID, and 3 doses IV Rocephin 2.0gm on 4/10-4/12/13, now with normalization of WBC.  3. Area on left forearm suggesting possible chemotherapy extravasation versus localized thrombophlebitis.  4. Mildly symptomatic anemia, asymptomatic thrombocytopenia chemotherapy-induced.   Case reviewed with Dr. Pierce Crane.  Plan:  Randalyn will receive week 3 cycle 1/4 single agent Taxol given weekly in the neoadjuvant setting for 3 weeks on 1 week off today as scheduled. She will have next week off, we will regroup in 2 weeks' time prior to week 1 cycle 2. She knows to contact us in the interim if the need should arise.  Pippa Hanif T, PA-C 05/10/2012

## 2012-05-10 NOTE — Patient Instructions (Signed)
[facility] Discharge Instructions for Patients Receiving Chemotherapy  Today you received the following chemotherapy agent Taxol  To help prevent nausea and vomiting after your treatment, we encourage you to take your nausea medication. Begin taking it as often as prescribed for by Dr. Donnie Coffin.    If you develop nausea and vomiting that is not controlled by your nausea medication, call the clinic. If it is after clinic hours your family physician or the after hours number for the clinic or go to the Emergency Department.   BELOW ARE SYMPTOMS THAT SHOULD BE REPORTED IMMEDIATELY:  *FEVER GREATER THAN 100.5 F  *CHILLS WITH OR WITHOUT FEVER  NAUSEA AND VOMITING THAT IS NOT CONTROLLED WITH YOUR NAUSEA MEDICATION  *UNUSUAL SHORTNESS OF BREATH  *UNUSUAL BRUISING OR BLEEDING  TENDERNESS IN MOUTH AND THROAT WITH OR WITHOUT PRESENCE OF ULCERS  *URINARY PROBLEMS  *BOWEL PROBLEMS  UNUSUAL RASH Items with * indicate a potential emergency and should be followed up as soon as possible.  One of the nurses will contact you 24 hours after your treatment. Please let the nurse know about any problems that you may have experienced. Feel free to call the clinic you have any questions or concerns. The clinic phone number is [phone].   I have been informed and understand all the instructions given to me. I know to contact the clinic, my physician, or go to the Emergency Department if any problems should occur. I do not have any questions at this time, but understand that I may call the clinic during office hours   should I have any questions or need assistance in obtaining follow up care.    __________________________________________  _____________  __________ Signature of Patient or Authorized Representative            Date                   Time    __________________________________________ Nurse's Signature

## 2012-05-24 ENCOUNTER — Ambulatory Visit (HOSPITAL_BASED_OUTPATIENT_CLINIC_OR_DEPARTMENT_OTHER): Payer: PRIVATE HEALTH INSURANCE

## 2012-05-24 ENCOUNTER — Other Ambulatory Visit (HOSPITAL_BASED_OUTPATIENT_CLINIC_OR_DEPARTMENT_OTHER): Payer: PRIVATE HEALTH INSURANCE | Admitting: Lab

## 2012-05-24 ENCOUNTER — Encounter: Payer: Self-pay | Admitting: Physician Assistant

## 2012-05-24 ENCOUNTER — Ambulatory Visit (HOSPITAL_BASED_OUTPATIENT_CLINIC_OR_DEPARTMENT_OTHER): Payer: PRIVATE HEALTH INSURANCE | Admitting: Physician Assistant

## 2012-05-24 VITALS — BP 106/73 | HR 96 | Temp 98.7°F | Ht 68.0 in | Wt 163.3 lb

## 2012-05-24 DIAGNOSIS — Z170 Estrogen receptor positive status [ER+]: Secondary | ICD-10-CM

## 2012-05-24 DIAGNOSIS — C50519 Malignant neoplasm of lower-outer quadrant of unspecified female breast: Secondary | ICD-10-CM

## 2012-05-24 DIAGNOSIS — C773 Secondary and unspecified malignant neoplasm of axilla and upper limb lymph nodes: Secondary | ICD-10-CM

## 2012-05-24 DIAGNOSIS — Z5111 Encounter for antineoplastic chemotherapy: Secondary | ICD-10-CM

## 2012-05-24 DIAGNOSIS — D6481 Anemia due to antineoplastic chemotherapy: Secondary | ICD-10-CM

## 2012-05-24 LAB — COMPREHENSIVE METABOLIC PANEL
ALT: 40 U/L — ABNORMAL HIGH (ref 0–35)
CO2: 26 mEq/L (ref 19–32)
Calcium: 9 mg/dL (ref 8.4–10.5)
Chloride: 108 mEq/L (ref 96–112)
Sodium: 141 mEq/L (ref 135–145)
Total Protein: 5.6 g/dL — ABNORMAL LOW (ref 6.0–8.3)

## 2012-05-24 LAB — CBC WITH DIFFERENTIAL/PLATELET
Eosinophils Absolute: 0.1 10*3/uL (ref 0.0–0.5)
MONO#: 0.9 10*3/uL (ref 0.1–0.9)
NEUT#: 2.1 10*3/uL (ref 1.5–6.5)
RBC: 3.07 10*6/uL — ABNORMAL LOW (ref 3.70–5.45)
RDW: 16.1 % — ABNORMAL HIGH (ref 11.2–14.5)
WBC: 3.8 10*3/uL — ABNORMAL LOW (ref 3.9–10.3)
nRBC: 0 % (ref 0–0)

## 2012-05-24 LAB — LACTATE DEHYDROGENASE: LDH: 212 U/L (ref 94–250)

## 2012-05-24 MED ORDER — PACLITAXEL CHEMO INJECTION 300 MG/50ML
80.0000 mg/m2 | Freq: Once | INTRAVENOUS | Status: AC
  Administered 2012-05-24: 150 mg via INTRAVENOUS
  Filled 2012-05-24: qty 25

## 2012-05-24 MED ORDER — SODIUM CHLORIDE 0.9 % IJ SOLN
10.0000 mL | INTRAMUSCULAR | Status: DC | PRN
  Administered 2012-05-24: 10 mL
  Filled 2012-05-24: qty 10

## 2012-05-24 MED ORDER — HEPARIN SOD (PORK) LOCK FLUSH 100 UNIT/ML IV SOLN
500.0000 [IU] | Freq: Once | INTRAVENOUS | Status: AC | PRN
  Administered 2012-05-24: 500 [IU]
  Filled 2012-05-24: qty 5

## 2012-05-24 MED ORDER — SODIUM CHLORIDE 0.9 % IV SOLN
Freq: Once | INTRAVENOUS | Status: AC
  Administered 2012-05-24: 14:00:00 via INTRAVENOUS

## 2012-05-24 MED ORDER — FAMOTIDINE IN NACL 20-0.9 MG/50ML-% IV SOLN
20.0000 mg | Freq: Once | INTRAVENOUS | Status: AC
  Administered 2012-05-24: 20 mg via INTRAVENOUS

## 2012-05-24 MED ORDER — ONDANSETRON 8 MG/50ML IVPB (CHCC)
8.0000 mg | Freq: Once | INTRAVENOUS | Status: AC
  Administered 2012-05-24: 8 mg via INTRAVENOUS

## 2012-05-24 MED ORDER — DIPHENHYDRAMINE HCL 50 MG/ML IJ SOLN
50.0000 mg | Freq: Once | INTRAMUSCULAR | Status: AC
  Administered 2012-05-24: 50 mg via INTRAVENOUS

## 2012-05-24 MED ORDER — DEXAMETHASONE SODIUM PHOSPHATE 10 MG/ML IJ SOLN
10.0000 mg | Freq: Once | INTRAMUSCULAR | Status: AC
  Administered 2012-05-24: 10 mg via INTRAVENOUS

## 2012-05-24 NOTE — Progress Notes (Signed)
Hematology and Oncology Follow Up Visit  Sydney Cervantes [id number] [birth year] 51 y.o. 05/24/2012    HPI: Hajar is a 51 year old [place] woman with a locally advanced, clinical stage T4, N3, infiltrating lobular carcinoma of the left breast with lymph node involvement and diffuse skin thickening. ER/PR positive at 63/5% respectively, HER-2 negative, elevated Ki-67.  She completed 4/6 planned neoadjuvant dose dense FEC with Neulasta support on day 2. 20% dose reduction on 5-FU, 50% dose reduction on epirubicin, 40% dose reduction on Cytoxan, after one-week delay due to anemia/thrombocytopenia/and region of probable localized thrombophlebitis versus mild chemotherapy extravasation on the left forearm, covered with Keflex 500 mg by mouth 3 times a day.  Switched to single agent neoadjuvant Taxol, given 3 weeks on 1 week off, planned 4 cycles, due for week 1, cycle 2/4  today.  2. History of  severe neutropenia with associated fever, despite Neulasta, covered with Cipro 500mg  po BID, Augmentin 875mg  po BID, and 3 doses IV Rocephin 2.0gm on 4/10-4/12/13, now with normalization of WBC.  3. Area on left forearm suggesting possible chemotherapy extravasation versus localized thrombophlebitis.  4. Mildly symptomatic anemia, asymptomatic thrombocytopenia chemotherapy-induced.   Interim History:   Sydney Cervantes is seen today with her husband in accompaniment for followup in anticipation of week 1 cycle 2/4 of neoadjuvant weekly Taxol. She is fatigued, she denies any unexplained fevers or chills though. No shortness of breath or chest pain. No nausea, emesis, diarrhea or constipation problems. She denies any neuropathy symptoms. No bleeding or bruising symptoms. A detailed review of systems is otherwise noncontributory as noted below.  Review of Systems: Constitutional:  no weight loss, fever, night sweats and feels well Eyes: uses glasses ENT: no complaints Cardiovascular: no chest pain or  dyspnea on exertion Respiratory: no cough, shortness of breath, or wheezing Neurological: no TIA or stroke symptoms Dermatological: negative Gastrointestinal: no abdominal pain, change in bowel habits, or black or bloody stools Genito-Urinary: no dysuria, trouble voiding, or hematuria Hematological and Lymphatic: negative Breast: positive for - known L breast mass Musculoskeletal: negative Remaining ROS negative.   Medications:   I have reviewed the patient's current medications.  Current Outpatient Prescriptions  Medication Sig Dispense Refill  . acetaminophen (TYLENOL) 500 MG tablet Take 500 mg by mouth every 6 (six) hours as needed. For pain/headaches.      . lidocaine-prilocaine (EMLA) cream Apply topically as needed.  30 g  1  . LORazepam (ATIVAN) 0.5 MG tablet Take 1 tablet (0.5 mg total) by mouth every 6 (six) hours as needed (Nausea or vomiting).  30 tablet  0  . ondansetron (ZOFRAN) 8 MG tablet Take 1 tablet two times a day as needed for nausea or vomiting starting on the third day after chemotherapy.  30 tablet  1  . prochlorperazine (COMPAZINE) 10 MG tablet Take 1 tablet (10 mg total) by mouth every 6 (six) hours as needed (Nausea or vomiting).  30 tablet  1  . prochlorperazine (COMPAZINE) 25 MG suppository Place 1 suppository (25 mg total) rectally every 12 (twelve) hours as needed for nausea.  12 suppository  3    Allergies:  Allergies  Allergen Reactions  . Adhesive (Tape) Itching and Rash    Rash and itching at steristrip and EKG lead pad sites    Physical Exam: Filed Vitals:   05/24/12 1310  BP: 106/73  Pulse: 96  Temp: 98.7 F (37.1 C)    Body mass index is 24.83 kg/(m^2). Weight: 163 lbs. HEENT:  Sclerae  anicteric, conjunctivae pink.  Oropharynx clear.  No mucositis or candidiasis.   Nodes:  No cervical, supraclavicular, or axillary lymphadenopathy palpated.  Breast Exam: The left breast was examined, there is generalized vague thickening predominantly in  the upper outer quadrant. No frank skin nodularity or deep palpable masses appreciated. The axilla is also fairly clear to any palpable thickening. Lungs:  Clear to auscultation bilaterally.  No crackles, rhonchi, or wheezes.   Heart:  Regular rate and rhythm.   Abdomen:  Soft, nontender.  Positive bowel sounds.  No organomegaly or masses palpated.   Musculoskeletal:  No focal spinal tenderness to palpation.  Extremities:  Benign.  No peripheral edema or cyanosis.   Skin: Over the anterior left forearm,  the area previously noted to have ecchymoses and possible superficial thrombophlebitis appears to be improving.  No evidence of skin breakdown.   Neuro:  Nonfocal, alert and oriented x 3.   Lab Results: Lab Results  Component Value Date   WBC 3.8* 05/24/2012   HGB 10.6* 05/24/2012   HCT 31.5* 05/24/2012   MCV 102.6* 05/24/2012   PLT 185 05/24/2012   NEUTROABS 2.1 05/24/2012     Chemistry      Component Value Date/Time   NA 142 05/10/2012 0855   K 3.8 05/10/2012 0855   CL 108 05/10/2012 0855   CO2 24 05/10/2012 0855   BUN 12 05/10/2012 0855   CREATININE 0.62 05/10/2012 0855      Component Value Date/Time   CALCIUM 8.9 05/10/2012 0855   ALKPHOS 61 05/10/2012 0855   AST 27 05/10/2012 0855   ALT 35 05/10/2012 0855   BILITOT 0.4 05/10/2012 0855      Lab Results  Component Value Date   LABCA2 56* 03/08/2012   Radiographic data: 05/06/12 BILATERAL BREAST MRI WITH AND WITHOUT CONTRAST  Technique: Multiplanar, multisequence MR images of both breasts were obtained prior to and following the intravenous administration of 14ml of Multihance. Three dimensional images were evaluated at the independent DynaCad workstation.  Comparison: MRI 01/22/2012 Findings: There continues to be skin thickening and skin enhancement of the left breast. Diffuse abnormal enhancement is seen throughout the central portion of the left breast involving all four quadrants. Enhancement is significantly more ill-defined and less  mass-like in appearance. Enhancement measures approximately 8.0 x 7.5 x 4.5 cm. Along the inferior aspect of the breast, a more mass-like area of enhancement measures 1.6 x 1.9 x 1.5 cm, also seen on the previous exam and adjacent to the clip artifact from prior biopsy. Despite the similar measurements to the previous exam, there has been significant improvement based on enhancement kinetics. The contour of the left breast is improved with less nipple retraction. Previously, there was enhancement on the anterior aspect of the left pectoralis muscle, no longer apparent.  Multiple enlarged left axillary lymph nodes are identified on the  prior study. Nodes are now normal in size and morphology in the  left axilla. On the right, background parenchymal enhancement is minimal. No suspicious enhancement is identified. Right axillary lymph nodes are normal in morphology. The patient has a right-sided Port-A- Cath.  IMPRESSION:  1. Significant improvement in the enhancement kinetics involving  the central aspect of the left breast.  2. Significant improvement in the contour of the left breast with  less nipple retraction.  3. Significant improvement in left axillary adenopathy. No  axillary lymph nodes appear suspicious on the current study.  4. There is no longer suspicious enhancement along the anterior  aspect of the left pectoralis muscle.  THREE-DIMENSIONAL MR IMAGE RENDERING ON INDEPENDENT WORKSTATION:  Three-dimensional MR images were rendered by post-processing of the original MR data on an independent workstation. The three-  dimensional MR images were interpreted, and findings were reported in the accompanying complete MRI report for this study.  BI-RADS CATEGORY 6: Known biopsy-proven malignancy - appropriate action should be taken.  Original Report Authenticated By: Patterson Hammersmith, M.D.  Assessment:  Sydney Cervantes is a 51 year old [place] woman with a locally advanced, clinical  stage T4, N3, infiltrating lobular carcinoma of the left breast with lymph node involvement and diffuse skin thickening. ER/PR positive at 63/5% respectively, HER-2 negative, elevated Ki-67.  She completed 4/6 planned neoadjuvant dose dense FEC with Neulasta support on day 2. 20% dose reduction on 5-FU, 50% dose reduction on epirubicin, 40% dose reduction on Cytoxan, after one-week delay due to anemia/thrombocytopenia/and region of probable localized thrombophlebitis versus mild chemotherapy extravasation on the left forearm, covered with Keflex 500 mg by mouth 3 times a day.  Switched to single agent neoadjuvant Taxol, given 3 weeks on 1 week off, planned 4 cycles, due for week 1, cycle 2/4 today.  2. History of  severe neutropenia with associated fever, despite Neulasta, covered with Cipro 500mg  po BID, Augmentin 875mg  po BID, and 3 doses IV Rocephin 2.0gm on 4/10-4/12/13, now with normalization of WBC.  3. Area on left forearm suggesting possible chemotherapy extravasation versus localized thrombophlebitis.  4. Mildly symptomatic anemia, asymptomatic thrombocytopenia chemotherapy-induced.   Case reviewed with Dr. Pierce Crane.  Plan:  Kerrin will receive week 1 cycle 2/4 single agent Taxol given weekly in the neoadjuvant setting for 3 weeks on 1 week off today as scheduled.  We will regroup in 1 week prior to week 2 cycle 2. She knows to contact us in the interim if the need should arise.  Berma Harts T, PA-C 05/24/2012

## 2012-05-24 NOTE — Patient Instructions (Addendum)
[facility] Discharge Instructions for Patients Receiving Chemotherapy  Today you received the following chemotherapy agent Taxol  To help prevent nausea and vomiting after your treatment, we encourage you to take your nausea medication. Begin taking it as often as prescribed for by Dr. Rubin.    If you develop nausea and vomiting that is not controlled by your nausea medication, call the clinic. If it is after clinic hours your family physician or the after hours number for the clinic or go to the Emergency Department.   BELOW ARE SYMPTOMS THAT SHOULD BE REPORTED IMMEDIATELY:  *FEVER GREATER THAN 100.5 F  *CHILLS WITH OR WITHOUT FEVER  NAUSEA AND VOMITING THAT IS NOT CONTROLLED WITH YOUR NAUSEA MEDICATION  *UNUSUAL SHORTNESS OF BREATH  *UNUSUAL BRUISING OR BLEEDING  TENDERNESS IN MOUTH AND THROAT WITH OR WITHOUT PRESENCE OF ULCERS  *URINARY PROBLEMS  *BOWEL PROBLEMS  UNUSUAL RASH Items with * indicate a potential emergency and should be followed up as soon as possible.  One of the nurses will contact you 24 hours after your treatment. Please let the nurse know about any problems that you may have experienced. Feel free to call the clinic you have any questions or concerns. The clinic phone number is [phone].   I have been informed and understand all the instructions given to me. I know to contact the clinic, my physician, or go to the Emergency Department if any problems should occur. I do not have any questions at this time, but understand that I may call the clinic during office hours   should I have any questions or need assistance in obtaining follow up care.    __________________________________________  _____________  __________ Signature of Patient or Authorized Representative            Date                   Time    __________________________________________ Nurse's Signature    

## 2012-05-31 ENCOUNTER — Telehealth: Payer: Self-pay | Admitting: *Deleted

## 2012-05-31 ENCOUNTER — Encounter: Payer: Self-pay | Admitting: Physician Assistant

## 2012-05-31 ENCOUNTER — Ambulatory Visit (HOSPITAL_BASED_OUTPATIENT_CLINIC_OR_DEPARTMENT_OTHER): Payer: PRIVATE HEALTH INSURANCE

## 2012-05-31 ENCOUNTER — Other Ambulatory Visit (HOSPITAL_BASED_OUTPATIENT_CLINIC_OR_DEPARTMENT_OTHER): Payer: PRIVATE HEALTH INSURANCE | Admitting: Lab

## 2012-05-31 ENCOUNTER — Ambulatory Visit (HOSPITAL_BASED_OUTPATIENT_CLINIC_OR_DEPARTMENT_OTHER): Payer: PRIVATE HEALTH INSURANCE | Admitting: Physician Assistant

## 2012-05-31 VITALS — BP 109/74 | HR 93 | Temp 98.8°F | Ht 68.0 in | Wt 162.1 lb

## 2012-05-31 DIAGNOSIS — C50519 Malignant neoplasm of lower-outer quadrant of unspecified female breast: Secondary | ICD-10-CM

## 2012-05-31 DIAGNOSIS — Z170 Estrogen receptor positive status [ER+]: Secondary | ICD-10-CM

## 2012-05-31 DIAGNOSIS — D649 Anemia, unspecified: Secondary | ICD-10-CM

## 2012-05-31 DIAGNOSIS — C773 Secondary and unspecified malignant neoplasm of axilla and upper limb lymph nodes: Secondary | ICD-10-CM

## 2012-05-31 DIAGNOSIS — Z5111 Encounter for antineoplastic chemotherapy: Secondary | ICD-10-CM

## 2012-05-31 LAB — CBC WITH DIFFERENTIAL/PLATELET
Basophils Absolute: 0 10*3/uL (ref 0.0–0.1)
EOS%: 3.4 % (ref 0.0–7.0)
Eosinophils Absolute: 0.1 10*3/uL (ref 0.0–0.5)
HGB: 10.7 g/dL — ABNORMAL LOW (ref 11.6–15.9)
LYMPH%: 26.4 % (ref 14.0–49.7)
MCH: 34.4 pg — ABNORMAL HIGH (ref 25.1–34.0)
MCV: 101.3 fL — ABNORMAL HIGH (ref 79.5–101.0)
MONO%: 8.9 % (ref 0.0–14.0)
NEUT#: 2.1 10*3/uL (ref 1.5–6.5)
NEUT%: 61 % (ref 38.4–76.8)
Platelets: 169 10*3/uL (ref 145–400)
RDW: 14.1 % (ref 11.2–14.5)

## 2012-05-31 MED ORDER — DEXAMETHASONE SODIUM PHOSPHATE 10 MG/ML IJ SOLN
10.0000 mg | Freq: Once | INTRAMUSCULAR | Status: AC
  Administered 2012-05-31: 10 mg via INTRAVENOUS

## 2012-05-31 MED ORDER — DIPHENHYDRAMINE HCL 50 MG/ML IJ SOLN
50.0000 mg | Freq: Once | INTRAMUSCULAR | Status: AC
  Administered 2012-05-31: 50 mg via INTRAVENOUS

## 2012-05-31 MED ORDER — FAMOTIDINE IN NACL 20-0.9 MG/50ML-% IV SOLN
20.0000 mg | Freq: Once | INTRAVENOUS | Status: AC
  Administered 2012-05-31: 20 mg via INTRAVENOUS

## 2012-05-31 MED ORDER — SODIUM CHLORIDE 0.9 % IV SOLN
Freq: Once | INTRAVENOUS | Status: AC
  Administered 2012-05-31: 15:00:00 via INTRAVENOUS

## 2012-05-31 MED ORDER — PACLITAXEL CHEMO INJECTION 300 MG/50ML
80.0000 mg/m2 | Freq: Once | INTRAVENOUS | Status: AC
  Administered 2012-05-31: 150 mg via INTRAVENOUS
  Filled 2012-05-31: qty 25

## 2012-05-31 MED ORDER — SODIUM CHLORIDE 0.9 % IJ SOLN
10.0000 mL | INTRAMUSCULAR | Status: DC | PRN
  Administered 2012-05-31: 10 mL
  Filled 2012-05-31: qty 10

## 2012-05-31 MED ORDER — ONDANSETRON 8 MG/50ML IVPB (CHCC)
8.0000 mg | Freq: Once | INTRAVENOUS | Status: AC
  Administered 2012-05-31: 8 mg via INTRAVENOUS

## 2012-05-31 MED ORDER — HEPARIN SOD (PORK) LOCK FLUSH 100 UNIT/ML IV SOLN
500.0000 [IU] | Freq: Once | INTRAVENOUS | Status: AC | PRN
  Administered 2012-05-31: 500 [IU]
  Filled 2012-05-31: qty 5

## 2012-05-31 NOTE — Telephone Encounter (Signed)
Emailed michelle to set up patient's treatments for 06-21-2012 06-28-2012  07-05-2012

## 2012-05-31 NOTE — Progress Notes (Signed)
Hematology and Oncology Follow Up Visit  Sydney Cervantes [id number] September 21, 1961 51 y.o. 05/31/2012    HPI: Sydney Cervantes is a 51 year old [place] woman with a locally advanced, clinical stage T4, N3, infiltrating lobular carcinoma of the left breast with lymph node involvement and diffuse skin thickening. ER/PR positive at 63/5% respectively, HER-2 negative, elevated Ki-67.  She completed 4/6 planned neoadjuvant dose dense FEC with Neulasta support on day 2. 20% dose reduction on 5-FU, 50% dose reduction on epirubicin, 40% dose reduction on Cytoxan, after one-week delay due to anemia/thrombocytopenia/and region of probable localized thrombophlebitis versus mild chemotherapy extravasation on the left forearm, covered with Keflex 500 mg by mouth 3 times a day.  Switched to single agent neoadjuvant Taxol, given 3 weeks on 1 week off, planned 4 cycles, due for week 2, cycle 2/4  today.  2. History of  severe neutropenia with associated fever, despite Neulasta, covered with Cipro 500mg  po BID, Augmentin 875mg  po BID, and 3 doses IV Rocephin 2.0gm on 4/10-4/12/13, now with normalization of WBC.  3. Area on left forearm suggesting possible chemotherapy extravasation versus localized thrombophlebitis.  4. Mildly symptomatic anemia, asymptomatic thrombocytopenia chemotherapy-induced.   Interim History:   Sydney Cervantes is seen today unaccompanied for followup in anticipation of week 2 cycle 2/4 of neoadjuvant weekly Taxol. She is fatigued, she denies any unexplained fevers or chills though. No shortness of breath or chest pain. No nausea, emesis, diarrhea or constipation problems. She denies any neuropathy symptoms. No bleeding or bruising symptoms. A detailed review of systems is otherwise noncontributory as noted below.  Review of Systems: Constitutional:  no weight loss, fever, night sweats and feels well Eyes: uses glasses ENT: no complaints Cardiovascular: no chest pain or dyspnea on  exertion Respiratory: no cough, shortness of breath, or wheezing Neurological: no TIA or stroke symptoms Dermatological: negative Gastrointestinal: no abdominal pain, change in bowel habits, or black or bloody stools Genito-Urinary: no dysuria, trouble voiding, or hematuria Hematological and Lymphatic: negative Breast: positive for - known L breast mass Musculoskeletal: negative Remaining ROS negative.   Medications:   I have reviewed the patient's current medications.  Current Outpatient Prescriptions  Medication Sig Dispense Refill  . acetaminophen (TYLENOL) 500 MG tablet Take 500 mg by mouth every 6 (six) hours as needed. For pain/headaches.      . lidocaine-prilocaine (EMLA) cream Apply topically as needed.  30 g  1  . LORazepam (ATIVAN) 0.5 MG tablet Take 1 tablet (0.5 mg total) by mouth every 6 (six) hours as needed (Nausea or vomiting).  30 tablet  0  . ondansetron (ZOFRAN) 8 MG tablet Take 1 tablet two times a day as needed for nausea or vomiting starting on the third day after chemotherapy.  30 tablet  1  . prochlorperazine (COMPAZINE) 10 MG tablet Take 1 tablet (10 mg total) by mouth every 6 (six) hours as needed (Nausea or vomiting).  30 tablet  1  . prochlorperazine (COMPAZINE) 25 MG suppository Place 1 suppository (25 mg total) rectally every 12 (twelve) hours as needed for nausea.  12 suppository  3    Allergies:  Allergies  Allergen Reactions  . Adhesive (Tape) Itching and Rash    Rash and itching at steristrip and EKG lead pad sites    Physical Exam: Filed Vitals:   05/31/12 1312  BP: 109/74  Pulse: 93  Temp: 98.8 F (37.1 C)    Body mass index is 24.65 kg/(m^2). Weight: 162 lbs. HEENT:  Sclerae anicteric, conjunctivae pink.  Oropharynx clear.  No mucositis or candidiasis.   Nodes:  No cervical, supraclavicular, or axillary lymphadenopathy palpated.  Breast Exam: Deferred. Lungs:  Clear to auscultation bilaterally.  No crackles, rhonchi, or wheezes.    Heart:  Regular rate and rhythm.   Abdomen:  Soft, nontender.  Positive bowel sounds.  No organomegaly or masses palpated.   Musculoskeletal:  No focal spinal tenderness to palpation.  Extremities:  Benign.  No peripheral edema or cyanosis.   Skin: Over the anterior left forearm,  the area previously noted to have ecchymoses and possible superficial thrombophlebitis appears to be improving.  No evidence of skin breakdown.   Neuro:  Nonfocal, alert and oriented x 3.   Lab Results: Lab Results  Component Value Date   WBC 3.5* 05/31/2012   HGB 10.7* 05/31/2012   HCT 31.5* 05/31/2012   MCV 101.3* 05/31/2012   PLT 169 05/31/2012   NEUTROABS 2.1 05/31/2012     Chemistry      Component Value Date/Time   NA 141 05/24/2012 1301   K 4.0 05/24/2012 1301   CL 108 05/24/2012 1301   CO2 26 05/24/2012 1301   BUN 12 05/24/2012 1301   CREATININE 0.56 05/24/2012 1301      Component Value Date/Time   CALCIUM 9.0 05/24/2012 1301   ALKPHOS 64 05/24/2012 1301   AST 36 05/24/2012 1301   ALT 40* 05/24/2012 1301   BILITOT 0.5 05/24/2012 1301      Lab Results  Component Value Date   LABCA2 56* 03/08/2012   Radiographic data: 05/06/12 BILATERAL BREAST MRI WITH AND WITHOUT CONTRAST  Technique: Multiplanar, multisequence MR images of both breasts were obtained prior to and following the intravenous administration of 14ml of Multihance. Three dimensional images were evaluated at the independent DynaCad workstation.  Comparison: MRI 01/22/2012 Findings: There continues to be skin thickening and skin enhancement of the left breast. Diffuse abnormal enhancement is seen throughout the central portion of the left breast involving all four quadrants. Enhancement is significantly more ill-defined and less mass-like in appearance. Enhancement measures approximately 8.0 x 7.5 x 4.5 cm. Along the inferior aspect of the breast, a more mass-like area of enhancement measures 1.6 x 1.9 x 1.5 cm, also seen on the previous exam and  adjacent to the clip artifact from prior biopsy. Despite the similar measurements to the previous exam, there has been significant improvement based on enhancement kinetics. The contour of the left breast is improved with less nipple retraction. Previously, there was enhancement on the anterior aspect of the left pectoralis muscle, no longer apparent.  Multiple enlarged left axillary lymph nodes are identified on the  prior study. Nodes are now normal in size and morphology in the  left axilla. On the right, background parenchymal enhancement is minimal. No suspicious enhancement is identified. Right axillary lymph nodes are normal in morphology. The patient has a right-sided Port-A- Cath.  IMPRESSION:  1. Significant improvement in the enhancement kinetics involving  the central aspect of the left breast.  2. Significant improvement in the contour of the left breast with  less nipple retraction.  3. Significant improvement in left axillary adenopathy. No  axillary lymph nodes appear suspicious on the current study.  4. There is no longer suspicious enhancement along the anterior  aspect of the left pectoralis muscle.  THREE-DIMENSIONAL MR IMAGE RENDERING ON INDEPENDENT WORKSTATION:  Three-dimensional MR images were rendered by post-processing of the original MR data on an independent workstation. The three-  dimensional MR images were  interpreted, and findings were reported in the accompanying complete MRI report for this study.  BI-RADS CATEGORY 6: Known biopsy-proven malignancy - appropriate action should be taken.  Original Report Authenticated By: Patterson Hammersmith, M.D.  Assessment:  Sydney Cervantes is a 51 year old [place] woman with a locally advanced, clinical stage T4, N3, infiltrating lobular carcinoma of the left breast with lymph node involvement and diffuse skin thickening. ER/PR positive at 63/5% respectively, HER-2 negative, elevated Ki-67.  She completed 4/6 planned  neoadjuvant dose dense FEC with Neulasta support on day 2. 20% dose reduction on 5-FU, 50% dose reduction on epirubicin, 40% dose reduction on Cytoxan, after one-week delay due to anemia/thrombocytopenia/and region of probable localized thrombophlebitis versus mild chemotherapy extravasation on the left forearm, covered with Keflex 500 mg by mouth 3 times a day.  Switched to single agent neoadjuvant Taxol, given 3 weeks on 1 week off, planned 4 cycles, due for week 2, cycle 2/4 today.  2. History of  severe neutropenia with associated fever, despite Neulasta, covered with Cipro 500mg  po BID, Augmentin 875mg  po BID, and 3 doses IV Rocephin 2.0gm on 4/10-4/12/13, now with normalization of WBC.  3. Area on left forearm suggesting possible chemotherapy extravasation versus localized thrombophlebitis.  4. Mildly symptomatic anemia, asymptomatic thrombocytopenia chemotherapy-induced.   Case reviewed with Dr. Pierce Crane.  Plan:  Sydney Cervantes will receive week 2 cycle 2/4 single agent Taxol given weekly in the neoadjuvant setting for 3 weeks on 1 week off today as scheduled.  We will regroup in 1 week prior to week 3 cycle 2. She knows to contact us in the interim if the need should arise.  Sydney Mabey T, PA-C 05/31/2012

## 2012-06-01 ENCOUNTER — Telehealth: Payer: Self-pay | Admitting: *Deleted

## 2012-06-01 NOTE — Telephone Encounter (Signed)
Per staff message I have scheduled appts. JMW  

## 2012-06-07 ENCOUNTER — Other Ambulatory Visit (HOSPITAL_BASED_OUTPATIENT_CLINIC_OR_DEPARTMENT_OTHER): Payer: Commercial Managed Care - PPO

## 2012-06-07 ENCOUNTER — Ambulatory Visit (HOSPITAL_BASED_OUTPATIENT_CLINIC_OR_DEPARTMENT_OTHER): Payer: Commercial Managed Care - PPO

## 2012-06-07 ENCOUNTER — Ambulatory Visit (HOSPITAL_BASED_OUTPATIENT_CLINIC_OR_DEPARTMENT_OTHER): Payer: Commercial Managed Care - PPO | Admitting: Physician Assistant

## 2012-06-07 ENCOUNTER — Other Ambulatory Visit: Payer: Self-pay | Admitting: Physician Assistant

## 2012-06-07 ENCOUNTER — Encounter: Payer: Self-pay | Admitting: Physician Assistant

## 2012-06-07 VITALS — BP 102/73 | HR 116 | Temp 99.4°F | Ht 68.0 in | Wt 163.1 lb

## 2012-06-07 DIAGNOSIS — C773 Secondary and unspecified malignant neoplasm of axilla and upper limb lymph nodes: Secondary | ICD-10-CM

## 2012-06-07 DIAGNOSIS — D709 Neutropenia, unspecified: Secondary | ICD-10-CM

## 2012-06-07 DIAGNOSIS — Z5111 Encounter for antineoplastic chemotherapy: Secondary | ICD-10-CM

## 2012-06-07 DIAGNOSIS — D649 Anemia, unspecified: Secondary | ICD-10-CM

## 2012-06-07 DIAGNOSIS — C50519 Malignant neoplasm of lower-outer quadrant of unspecified female breast: Secondary | ICD-10-CM

## 2012-06-07 DIAGNOSIS — C50912 Malignant neoplasm of unspecified site of left female breast: Secondary | ICD-10-CM

## 2012-06-07 LAB — CBC WITH DIFFERENTIAL/PLATELET
Basophils Absolute: 0 10*3/uL (ref 0.0–0.1)
Eosinophils Absolute: 0.1 10*3/uL (ref 0.0–0.5)
HGB: 11.1 g/dL — ABNORMAL LOW (ref 11.6–15.9)
LYMPH%: 27.6 % (ref 14.0–49.7)
MONO#: 0.2 10*3/uL (ref 0.1–0.9)
NEUT#: 2.1 10*3/uL (ref 1.5–6.5)
Platelets: 166 10*3/uL (ref 145–400)
RBC: 3.2 10*6/uL — ABNORMAL LOW (ref 3.70–5.45)
RDW: 13.9 % (ref 11.2–14.5)
WBC: 3.3 10*3/uL — ABNORMAL LOW (ref 3.9–10.3)
nRBC: 0 % (ref 0–0)

## 2012-06-07 MED ORDER — HEPARIN SOD (PORK) LOCK FLUSH 100 UNIT/ML IV SOLN
500.0000 [IU] | Freq: Once | INTRAVENOUS | Status: AC | PRN
  Administered 2012-06-07: 500 [IU]
  Filled 2012-06-07: qty 5

## 2012-06-07 MED ORDER — PACLITAXEL CHEMO INJECTION 300 MG/50ML
80.0000 mg/m2 | Freq: Once | INTRAVENOUS | Status: AC
  Administered 2012-06-07: 150 mg via INTRAVENOUS
  Filled 2012-06-07: qty 25

## 2012-06-07 MED ORDER — DEXAMETHASONE SODIUM PHOSPHATE 10 MG/ML IJ SOLN
10.0000 mg | Freq: Once | INTRAMUSCULAR | Status: AC
  Administered 2012-06-07: 10 mg via INTRAVENOUS

## 2012-06-07 MED ORDER — FAMOTIDINE IN NACL 20-0.9 MG/50ML-% IV SOLN
20.0000 mg | Freq: Once | INTRAVENOUS | Status: AC
  Administered 2012-06-07: 20 mg via INTRAVENOUS

## 2012-06-07 MED ORDER — DIPHENHYDRAMINE HCL 50 MG/ML IJ SOLN
50.0000 mg | Freq: Once | INTRAMUSCULAR | Status: AC
  Administered 2012-06-07: 50 mg via INTRAVENOUS

## 2012-06-07 MED ORDER — SODIUM CHLORIDE 0.9 % IJ SOLN
10.0000 mL | INTRAMUSCULAR | Status: DC | PRN
  Administered 2012-06-07: 10 mL
  Filled 2012-06-07: qty 10

## 2012-06-07 MED ORDER — ONDANSETRON 8 MG/50ML IVPB (CHCC)
8.0000 mg | Freq: Once | INTRAVENOUS | Status: AC
  Administered 2012-06-07: 8 mg via INTRAVENOUS

## 2012-06-07 MED ORDER — SODIUM CHLORIDE 0.9 % IV SOLN
Freq: Once | INTRAVENOUS | Status: AC
  Administered 2012-06-07: 15:00:00 via INTRAVENOUS

## 2012-06-07 NOTE — Progress Notes (Signed)
Hematology and Oncology Follow Up Visit  Sydney Cervantes [id number] 21-Oct-1961 51 y.o. 06/07/2012    HPI: Sydney Cervantes is a 51 year old [place] woman with a locally advanced, clinical stage T4, N3, infiltrating lobular carcinoma of the left breast with lymph node involvement and diffuse skin thickening. ER/PR positive at 63/5% respectively, HER-2 negative, elevated Ki-67.  She completed 4/6 planned neoadjuvant dose dense FEC with Neulasta support on day 2. 20% dose reduction on 5-FU, 50% dose reduction on epirubicin, 40% dose reduction on Cytoxan, after one-week delay due to anemia/thrombocytopenia/and region of probable localized thrombophlebitis versus mild chemotherapy extravasation on the left forearm, covered with Keflex 500 mg by mouth 3 times a day.  Switched to single agent neoadjuvant Taxol, given 3 weeks on 1 week off, planned 4 cycles, due for week 3, cycle 2/4  today.  2. History of  severe neutropenia with associated fever, despite Neulasta, covered with Cipro 500mg  po BID, Augmentin 875mg  po BID, and 3 doses IV Rocephin 2.0gm on 4/10-4/12/13, now with normalization of WBC.  3. Area on left forearm suggesting possible chemotherapy extravasation versus localized thrombophlebitis.  4. Mildly symptomatic anemia, asymptomatic thrombocytopenia chemotherapy-induced.   Interim History:   Sydney Cervantes is seen today unaccompanied for followup in anticipation of week 3 cycle 2/4 of neoadjuvant weekly Taxol. She is fatigued, she denies any unexplained fevers or chills though. No shortness of breath or chest pain. No nausea, emesis, diarrhea or constipation problems. She denies any neuropathy symptoms. No bleeding or bruising symptoms. A detailed review of systems is otherwise noncontributory as noted below.  Review of Systems: Constitutional:  no weight loss, fever, night sweats and feels well Eyes: uses glasses ENT: no complaints Cardiovascular: no chest pain or dyspnea on  exertion Respiratory: no cough, shortness of breath, or wheezing Neurological: no TIA or stroke symptoms Dermatological: negative Gastrointestinal: no abdominal pain, change in bowel habits, or black or bloody stools Genito-Urinary: no dysuria, trouble voiding, or hematuria Hematological and Lymphatic: negative Breast: positive for - known L breast mass Musculoskeletal: negative Remaining ROS negative.   Medications:   I have reviewed the patient's current medications.  Current Outpatient Prescriptions  Medication Sig Dispense Refill  . acetaminophen (TYLENOL) 500 MG tablet Take 500 mg by mouth every 6 (six) hours as needed. For pain/headaches.      . lidocaine-prilocaine (EMLA) cream Apply topically as needed.  30 g  1  . LORazepam (ATIVAN) 0.5 MG tablet Take 1 tablet (0.5 mg total) by mouth every 6 (six) hours as needed (Nausea or vomiting).  30 tablet  0  . ondansetron (ZOFRAN) 8 MG tablet Take 1 tablet two times a day as needed for nausea or vomiting starting on the third day after chemotherapy.  30 tablet  1  . prochlorperazine (COMPAZINE) 10 MG tablet Take 1 tablet (10 mg total) by mouth every 6 (six) hours as needed (Nausea or vomiting).  30 tablet  1  . prochlorperazine (COMPAZINE) 25 MG suppository Place 1 suppository (25 mg total) rectally every 12 (twelve) hours as needed for nausea.  12 suppository  3    Allergies:  Allergies  Allergen Reactions  . Adhesive (Tape) Itching and Rash    Rash and itching at steristrip and EKG lead pad sites    Physical Exam: Filed Vitals:   06/07/12 1258  BP: 102/73  Pulse: 116  Temp: 99.4 F (37.4 C)    Body mass index is 24.80 kg/(m^2). Weight: 163 lbs. HEENT:  Sclerae anicteric, conjunctivae pink.  Oropharynx clear.  No mucositis or candidiasis.   Nodes:  No cervical, supraclavicular, or axillary lymphadenopathy palpated.  Breast Exam: Deferred. Lungs:  Clear to auscultation bilaterally.  No crackles, rhonchi, or wheezes.    Heart:  Regular rate and rhythm.   Abdomen:  Soft, nontender.  Positive bowel sounds.  No organomegaly or masses palpated.   Musculoskeletal:  No focal spinal tenderness to palpation.  Extremities:  Benign.  No peripheral edema or cyanosis.   Skin: Over the anterior left forearm,  the area previously noted to have ecchymoses and possible superficial thrombophlebitis appears to be improving.  No evidence of skin breakdown.   Neuro:  Nonfocal, alert and oriented x 3.   Lab Results: Lab Results  Component Value Date   WBC 3.3* 06/07/2012   HGB 11.1* 06/07/2012   HCT 32.1* 06/07/2012   MCV 100.3 06/07/2012   PLT 166 06/07/2012   NEUTROABS 2.1 06/07/2012     Chemistry      Component Value Date/Time   NA 141 05/24/2012 1301   K 4.0 05/24/2012 1301   CL 108 05/24/2012 1301   CO2 26 05/24/2012 1301   BUN 12 05/24/2012 1301   CREATININE 0.56 05/24/2012 1301      Component Value Date/Time   CALCIUM 9.0 05/24/2012 1301   ALKPHOS 64 05/24/2012 1301   AST 36 05/24/2012 1301   ALT 40* 05/24/2012 1301   BILITOT 0.5 05/24/2012 1301      Lab Results  Component Value Date   LABCA2 56* 03/08/2012   Radiographic data: 05/06/12 BILATERAL BREAST MRI WITH AND WITHOUT CONTRAST  Technique: Multiplanar, multisequence MR images of both breasts were obtained prior to and following the intravenous administration of 14ml of Multihance. Three dimensional images were evaluated at the independent DynaCad workstation.  Comparison: MRI 01/22/2012 Findings: There continues to be skin thickening and skin enhancement of the left breast. Diffuse abnormal enhancement is seen throughout the central portion of the left breast involving all four quadrants. Enhancement is significantly more ill-defined and less mass-like in appearance. Enhancement measures approximately 8.0 x 7.5 x 4.5 cm. Along the inferior aspect of the breast, a more mass-like area of enhancement measures 1.6 x 1.9 x 1.5 cm, also seen on the previous exam and adjacent  to the clip artifact from prior biopsy. Despite the similar measurements to the previous exam, there has been significant improvement based on enhancement kinetics. The contour of the left breast is improved with less nipple retraction. Previously, there was enhancement on the anterior aspect of the left pectoralis muscle, no longer apparent.  Multiple enlarged left axillary lymph nodes are identified on the  prior study. Nodes are now normal in size and morphology in the  left axilla. On the right, background parenchymal enhancement is minimal. No suspicious enhancement is identified. Right axillary lymph nodes are normal in morphology. The patient has a right-sided Port-A- Cath.  IMPRESSION:  1. Significant improvement in the enhancement kinetics involving  the central aspect of the left breast.  2. Significant improvement in the contour of the left breast with  less nipple retraction.  3. Significant improvement in left axillary adenopathy. No  axillary lymph nodes appear suspicious on the current study.  4. There is no longer suspicious enhancement along the anterior  aspect of the left pectoralis muscle.  THREE-DIMENSIONAL MR IMAGE RENDERING ON INDEPENDENT WORKSTATION:  Three-dimensional MR images were rendered by post-processing of the original MR data on an independent workstation. The three-  dimensional MR images were  interpreted, and findings were reported in the accompanying complete MRI report for this study.  BI-RADS CATEGORY 6: Known biopsy-proven malignancy - appropriate action should be taken.  Original Report Authenticated By: Patterson Hammersmith, M.D.  Assessment:  Sydney Cervantes is a 51 year old [place] woman with a locally advanced, clinical stage T4, N3, infiltrating lobular carcinoma of the left breast with lymph node involvement and diffuse skin thickening. ER/PR positive at 63/5% respectively, HER-2 negative, elevated Ki-67.  She completed 4/6 planned neoadjuvant  dose dense FEC with Neulasta support on day 2. 20% dose reduction on 5-FU, 50% dose reduction on epirubicin, 40% dose reduction on Cytoxan, after one-week delay due to anemia/thrombocytopenia/and region of probable localized thrombophlebitis versus mild chemotherapy extravasation on the left forearm, covered with Keflex 500 mg by mouth 3 times a day.  Switched to single agent neoadjuvant Taxol, given 3 weeks on 1 week off, planned 4 cycles, due for week 3, cycle 2/4 today.  2. History of  severe neutropenia with associated fever, despite Neulasta, covered with Cipro 500mg  po BID, Augmentin 875mg  po BID, and 3 doses IV Rocephin 2.0gm on 4/10-4/12/13, now with normalization of WBC.  3. Area on left forearm suggesting possible chemotherapy extravasation versus localized thrombophlebitis.  4. Mildly symptomatic anemia, asymptomatic thrombocytopenia chemotherapy-induced.   Case reviewed with Dr. Pierce Crane.  Plan:  Twilia will receive week 3 cycle 2/4 single agent Taxol given weekly in the neoadjuvant setting for 3 weeks on 1 week off today as scheduled.  We will regroup in 2 week prior to week 1 cycle 3. She knows to contact us in the interim if the need should arise.  Britney Newstrom T, PA-C 06/07/2012

## 2012-06-07 NOTE — Patient Instructions (Signed)
[facility] Discharge Instructions for Patients Receiving Chemotherapy  Today you received the following chemotherapy agents Taxol To help prevent nausea and vomiting after your treatment, we encourage you to take your nausea medication Compazine. Begin taking it as needed.    If you develop nausea and vomiting that is not controlled by your nausea medication, call the clinic. If it is after clinic hours your family physician or the after hours number for the clinic or go to the Emergency Department.   BELOW ARE SYMPTOMS THAT SHOULD BE REPORTED IMMEDIATELY:  *FEVER GREATER THAN 100.5 F  *CHILLS WITH OR WITHOUT FEVER  NAUSEA AND VOMITING THAT IS NOT CONTROLLED WITH YOUR NAUSEA MEDICATION  *UNUSUAL SHORTNESS OF BREATH  *UNUSUAL BRUISING OR BLEEDING  TENDERNESS IN MOUTH AND THROAT WITH OR WITHOUT PRESENCE OF ULCERS  *URINARY PROBLEMS  *BOWEL PROBLEMS  UNUSUAL RASH Items with * indicate a potential emergency and should be followed up as soon as possible.  Feel free to call the clinic you have any questions or concerns. The clinic phone number is [phone].   I have been informed and understand all the instructions given to me. I know to contact the clinic, my physician, or go to the Emergency Department if any problems should occur. I do not have any questions at this time, but understand that I may call the clinic during office hours   should I have any questions or need assistance in obtaining follow up care.    __________________________________________  _____________  __________ Signature of Patient or Authorized Representative            Date                   Time    __________________________________________ Nurse's Signature

## 2012-06-21 ENCOUNTER — Ambulatory Visit (HOSPITAL_BASED_OUTPATIENT_CLINIC_OR_DEPARTMENT_OTHER): Payer: Commercial Managed Care - PPO | Admitting: Physician Assistant

## 2012-06-21 ENCOUNTER — Telehealth: Payer: Self-pay | Admitting: Oncology

## 2012-06-21 ENCOUNTER — Telehealth (INDEPENDENT_AMBULATORY_CARE_PROVIDER_SITE_OTHER): Payer: Self-pay | Admitting: General Surgery

## 2012-06-21 ENCOUNTER — Other Ambulatory Visit (HOSPITAL_BASED_OUTPATIENT_CLINIC_OR_DEPARTMENT_OTHER): Payer: Commercial Managed Care - PPO | Admitting: Lab

## 2012-06-21 ENCOUNTER — Ambulatory Visit (HOSPITAL_BASED_OUTPATIENT_CLINIC_OR_DEPARTMENT_OTHER): Payer: Commercial Managed Care - PPO

## 2012-06-21 VITALS — BP 124/79 | HR 105 | Temp 99.1°F | Ht 68.0 in | Wt 163.4 lb

## 2012-06-21 DIAGNOSIS — C773 Secondary and unspecified malignant neoplasm of axilla and upper limb lymph nodes: Secondary | ICD-10-CM

## 2012-06-21 DIAGNOSIS — C50519 Malignant neoplasm of lower-outer quadrant of unspecified female breast: Secondary | ICD-10-CM

## 2012-06-21 DIAGNOSIS — D6959 Other secondary thrombocytopenia: Secondary | ICD-10-CM

## 2012-06-21 DIAGNOSIS — Z5111 Encounter for antineoplastic chemotherapy: Secondary | ICD-10-CM

## 2012-06-21 DIAGNOSIS — D6481 Anemia due to antineoplastic chemotherapy: Secondary | ICD-10-CM

## 2012-06-21 LAB — CBC WITH DIFFERENTIAL/PLATELET
BASO%: 0.3 % (ref 0.0–2.0)
Eosinophils Absolute: 0.1 10*3/uL (ref 0.0–0.5)
MCHC: 33.9 g/dL (ref 31.5–36.0)
MCV: 100.3 fL (ref 79.5–101.0)
MONO#: 0.6 10*3/uL (ref 0.1–0.9)
MONO%: 18.5 % — ABNORMAL HIGH (ref 0.0–14.0)
NEUT#: 1.8 10*3/uL (ref 1.5–6.5)
RBC: 3.53 10*6/uL — ABNORMAL LOW (ref 3.70–5.45)
RDW: 14.6 % — ABNORMAL HIGH (ref 11.2–14.5)
WBC: 3.3 10*3/uL — ABNORMAL LOW (ref 3.9–10.3)
nRBC: 0 % (ref 0–0)

## 2012-06-21 LAB — COMPREHENSIVE METABOLIC PANEL
ALT: 45 U/L — ABNORMAL HIGH (ref 0–35)
AST: 42 U/L — ABNORMAL HIGH (ref 0–37)
CO2: 29 mEq/L (ref 19–32)
Sodium: 142 mEq/L (ref 135–145)
Total Bilirubin: 0.5 mg/dL (ref 0.3–1.2)
Total Protein: 6.1 g/dL (ref 6.0–8.3)

## 2012-06-21 LAB — LACTATE DEHYDROGENASE: LDH: 252 U/L — ABNORMAL HIGH (ref 94–250)

## 2012-06-21 MED ORDER — ONDANSETRON 8 MG/50ML IVPB (CHCC)
8.0000 mg | Freq: Once | INTRAVENOUS | Status: AC
  Administered 2012-06-21: 8 mg via INTRAVENOUS

## 2012-06-21 MED ORDER — SODIUM CHLORIDE 0.9 % IV SOLN
Freq: Once | INTRAVENOUS | Status: AC
  Administered 2012-06-21: 15:00:00 via INTRAVENOUS

## 2012-06-21 MED ORDER — FAMOTIDINE IN NACL 20-0.9 MG/50ML-% IV SOLN
20.0000 mg | Freq: Once | INTRAVENOUS | Status: AC
  Administered 2012-06-21: 20 mg via INTRAVENOUS

## 2012-06-21 MED ORDER — DIPHENHYDRAMINE HCL 50 MG/ML IJ SOLN
50.0000 mg | Freq: Once | INTRAMUSCULAR | Status: AC
  Administered 2012-06-21: 50 mg via INTRAVENOUS

## 2012-06-21 MED ORDER — DEXAMETHASONE SODIUM PHOSPHATE 10 MG/ML IJ SOLN
10.0000 mg | Freq: Once | INTRAMUSCULAR | Status: AC
  Administered 2012-06-21: 10 mg via INTRAVENOUS

## 2012-06-21 MED ORDER — PACLITAXEL CHEMO INJECTION 300 MG/50ML
80.0000 mg/m2 | Freq: Once | INTRAVENOUS | Status: AC
  Administered 2012-06-21: 150 mg via INTRAVENOUS
  Filled 2012-06-21: qty 25

## 2012-06-21 MED ORDER — HEPARIN SOD (PORK) LOCK FLUSH 100 UNIT/ML IV SOLN
500.0000 [IU] | Freq: Once | INTRAVENOUS | Status: AC | PRN
  Administered 2012-06-21: 500 [IU]
  Filled 2012-06-21: qty 5

## 2012-06-21 MED ORDER — SODIUM CHLORIDE 0.9 % IJ SOLN
10.0000 mL | INTRAMUSCULAR | Status: DC | PRN
  Administered 2012-06-21: 10 mL
  Filled 2012-06-21: qty 10

## 2012-06-21 NOTE — Patient Instructions (Signed)
[facility] Discharge Instructions for Patients Receiving Chemotherapy  Today you received the following chemotherapy agents Taxol.  To help prevent nausea and vomiting after your treatment, we encourage you to take your nausea medication.   If you develop nausea and vomiting that is not controlled by your nausea medication, call the clinic. If it is after clinic hours your family physician or the after hours number for the clinic or go to the Emergency Department.   BELOW ARE SYMPTOMS THAT SHOULD BE REPORTED IMMEDIATELY:  *FEVER GREATER THAN 100.5 F  *CHILLS WITH OR WITHOUT FEVER  NAUSEA AND VOMITING THAT IS NOT CONTROLLED WITH YOUR NAUSEA MEDICATION  *UNUSUAL SHORTNESS OF BREATH  *UNUSUAL BRUISING OR BLEEDING  TENDERNESS IN MOUTH AND THROAT WITH OR WITHOUT PRESENCE OF ULCERS  *URINARY PROBLEMS  *BOWEL PROBLEMS  UNUSUAL RASH Items with * indicate a potential emergency and should be followed up as soon as possible.  One of the nurses will contact you 24 hours after your treatment. Please let the nurse know about any problems that you may have experienced. Feel free to call the clinic you have any questions or concerns. The clinic phone number is [phone].   I have been informed and understand all the instructions given to me. I know to contact the clinic, my physician, or go to the Emergency Department if any problems should occur. I do not have any questions at this time, but understand that I may call the clinic during office hours   should I have any questions or need assistance in obtaining follow up care.    __________________________________________  _____________  __________ Signature of Patient or Authorized Representative            Date                   Time    __________________________________________ Nurse's Signature    

## 2012-06-21 NOTE — Telephone Encounter (Signed)
Patient coming to see Dr Ezzard Standing on 07/22/12 to discuss surgery post neoadjuvant therapy for breast cancer. They are calling to request a sooner appt. Please advise.

## 2012-06-21 NOTE — Telephone Encounter (Signed)
The pt is aware to pick up her aug schedule. Sent michelle a staff message

## 2012-06-22 NOTE — Progress Notes (Signed)
Hematology and Oncology Follow Up Visit  Sydney Cervantes [id number] [birth year] 51 y.o. 06/21/12   HPI: Sydney Cervantes is a 51 year old [place] woman with a locally advanced, clinical stage T4, N3, infiltrating lobular carcinoma of the left breast with lymph node involvement and diffuse skin thickening. ER/PR positive at 63/5% respectively, HER-2 negative, elevated Ki-67.  She completed 4/6 planned neoadjuvant dose dense FEC with Neulasta support on day 2. 20% dose reduction on 5-FU, 50% dose reduction on epirubicin, 40% dose reduction on Cytoxan, after one-week delay due to anemia/thrombocytopenia/and region of probable localized thrombophlebitis versus mild chemotherapy extravasation on the left forearm, covered with Keflex 500 mg by mouth 3 times a day.  Switched to single agent neoadjuvant Taxol, given 3 weeks on 1 week off, planned 4 cycles, due for week 1, cycle 3/4  today.  2. History of  severe neutropenia with associated fever, despite Neulasta, covered with Cipro 500mg  po BID, Augmentin 875mg  po BID, and 3 doses IV Rocephin 2.0gm on 4/10-4/12/13, now with normalization of WBC.  3. Area on left forearm suggesting possible chemotherapy extravasation versus localized thrombophlebitis.  4. Mildly symptomatic anemia, asymptomatic thrombocytopenia chemotherapy-induced.   Interim History:   Sydney Cervantes is seen today with her husband in accompaniment for followup in anticipation of week 1 cycle 3/4 of neoadjuvant weekly Taxol. She is fatigued, she denies any unexplained fevers or chills though. No shortness of breath or chest pain. No nausea, emesis, diarrhea or constipation problems. She continues to deny any neuropathy symptoms. No bleeding or bruising symptoms. A detailed review of systems is otherwise noncontributory as noted below.  Review of Systems: Constitutional:  no weight loss, fever, night sweats and feels well Eyes: uses glasses ENT: no complaints Cardiovascular: no chest  pain or dyspnea on exertion Respiratory: no cough, shortness of breath, or wheezing Neurological: no TIA or stroke symptoms Dermatological: negative Gastrointestinal: no abdominal pain, change in bowel habits, or black or bloody stools Genito-Urinary: no dysuria, trouble voiding, or hematuria Hematological and Lymphatic: negative Breast: positive for - known L breast mass Musculoskeletal: negative Remaining ROS negative.   Medications:   I have reviewed the patient's current medications.  Current Outpatient Prescriptions  Medication Sig Dispense Refill  . acetaminophen (TYLENOL) 500 MG tablet Take 500 mg by mouth every 6 (six) hours as needed. For pain/headaches.      . lidocaine-prilocaine (EMLA) cream Apply topically as needed.  30 g  1  . LORazepam (ATIVAN) 0.5 MG tablet Take 1 tablet (0.5 mg total) by mouth every 6 (six) hours as needed (Nausea or vomiting).  30 tablet  0  . ondansetron (ZOFRAN) 8 MG tablet Take 1 tablet two times a day as needed for nausea or vomiting starting on the third day after chemotherapy.  30 tablet  1  . prochlorperazine (COMPAZINE) 10 MG tablet Take 1 tablet (10 mg total) by mouth every 6 (six) hours as needed (Nausea or vomiting).  30 tablet  1  . prochlorperazine (COMPAZINE) 25 MG suppository Place 1 suppository (25 mg total) rectally every 12 (twelve) hours as needed for nausea.  12 suppository  3   No current facility-administered medications for this visit.   Facility-Administered Medications Ordered in Other Visits  Medication Dose Route Frequency Provider Last Rate Last Dose  . DISCONTD: sodium chloride 0.9 % injection 10 mL  10 mL Intracatheter PRN Amada Kingfisher, PA   10 mL at 06/21/12 1627    Allergies:  Allergies  Allergen Reactions  . Adhesive (Tape)  Itching and Rash    Rash and itching at steristrip and EKG lead pad sites    Physical Exam: Filed Vitals:   06/21/12 1338  BP: 124/79  Pulse: 105  Temp: 99.1 F (37.3 C)     Body mass index is 24.84 kg/(m^2). Weight: 163 lbs. HEENT:  Sclerae anicteric, conjunctivae pink.  Oropharynx clear.  No mucositis or candidiasis.   Nodes:  No cervical, supraclavicular, or axillary lymphadenopathy palpated.  Breast Exam: The left breast was examined, there is generalized vague thickening predominantly in the upper outer quadrant. No frank skin nodularity or deep palpable masses appreciated. The axilla is also fairly clear to any palpable thickening. Lungs:  Clear to auscultation bilaterally.  No crackles, rhonchi, or wheezes.   Heart:  Regular rate and rhythm.   Abdomen:  Soft, nontender.  Positive bowel sounds.  No organomegaly or masses palpated.   Musculoskeletal:  No focal spinal tenderness to palpation.  Extremities:  Benign.  No peripheral edema or cyanosis.   Skin: Over the anterior left forearm,  the area previously noted to have ecchymoses and possible superficial thrombophlebitis appears to be improving.  No evidence of skin breakdown.   Neuro:  Nonfocal, alert and oriented x 3.   Lab Results: Lab Results  Component Value Date   WBC 3.3* 06/21/2012   HGB 12.0 06/21/2012   HCT 35.4 06/21/2012   MCV 100.3 06/21/2012   PLT 172 06/21/2012   NEUTROABS 1.8 06/21/2012     Chemistry      Component Value Date/Time   NA 142 06/21/2012 1259   K 4.0 06/21/2012 1259   CL 105 06/21/2012 1259   CO2 29 06/21/2012 1259   BUN 13 06/21/2012 1259   CREATININE 0.55 06/21/2012 1259      Component Value Date/Time   CALCIUM 9.4 06/21/2012 1259   ALKPHOS 62 06/21/2012 1259   AST 42* 06/21/2012 1259   ALT 45* 06/21/2012 1259   BILITOT 0.5 06/21/2012 1259      Lab Results  Component Value Date   LABCA2 56* 03/08/2012   Radiographic data: 05/06/12 BILATERAL BREAST MRI WITH AND WITHOUT CONTRAST  Technique: Multiplanar, multisequence MR images of both breasts were obtained prior to and following the intravenous administration of 14ml of Multihance. Three dimensional images were evaluated  at the independent DynaCad workstation.  Comparison: MRI 01/22/2012 Findings: There continues to be skin thickening and skin enhancement of the left breast. Diffuse abnormal enhancement is seen throughout the central portion of the left breast involving all four quadrants. Enhancement is significantly more ill-defined and less mass-like in appearance. Enhancement measures approximately 8.0 x 7.5 x 4.5 cm. Along the inferior aspect of the breast, a more mass-like area of enhancement measures 1.6 x 1.9 x 1.5 cm, also seen on the previous exam and adjacent to the clip artifact from prior biopsy. Despite the similar measurements to the previous exam, there has been significant improvement based on enhancement kinetics. The contour of the left breast is improved with less nipple retraction. Previously, there was enhancement on the anterior aspect of the left pectoralis muscle, no longer apparent.  Multiple enlarged left axillary lymph nodes are identified on the  prior study. Nodes are now normal in size and morphology in the  left axilla. On the right, background parenchymal enhancement is minimal. No suspicious enhancement is identified. Right axillary lymph nodes are normal in morphology. The patient has a right-sided Port-A- Cath.  IMPRESSION:  1. Significant improvement in the enhancement kinetics involving  the central aspect of the left breast.  2. Significant improvement in the contour of the left breast with  less nipple retraction.  3. Significant improvement in left axillary adenopathy. No  axillary lymph nodes appear suspicious on the current study.  4. There is no longer suspicious enhancement along the anterior  aspect of the left pectoralis muscle.  THREE-DIMENSIONAL MR IMAGE RENDERING ON INDEPENDENT WORKSTATION:  Three-dimensional MR images were rendered by post-processing of the original MR data on an independent workstation. The three-  dimensional MR images were interpreted, and  findings were reported in the accompanying complete MRI report for this study.  BI-RADS CATEGORY 6: Known biopsy-proven malignancy - appropriate action should be taken.  Original Report Authenticated By: Patterson Hammersmith, M.D.  Assessment:  Omie is a 51 year old [place] woman with a locally advanced, clinical stage T4, N3, infiltrating lobular carcinoma of the left breast with lymph node involvement and diffuse skin thickening. ER/PR positive at 63/5% respectively, HER-2 negative, elevated Ki-67.  She completed 4/6 planned neoadjuvant dose dense FEC with Neulasta support on day 2. 20% dose reduction on 5-FU, 50% dose reduction on epirubicin, 40% dose reduction on Cytoxan, after one-week delay due to anemia/thrombocytopenia/and region of probable localized thrombophlebitis versus mild chemotherapy extravasation on the left forearm, covered with Keflex 500 mg by mouth 3 times a day.  Switched to single agent neoadjuvant Taxol, given 3 weeks on 1 week off, planned 4 cycles, due for week 1, cycle 3/4 today.  2. History of  severe neutropenia with associated fever, despite Neulasta, covered with Cipro 500mg  po BID, Augmentin 875mg  po BID, and 3 doses IV Rocephin 2.0gm on 4/10-4/12/13, now with normalization of WBC.  3. Area on left forearm suggesting possible chemotherapy extravasation versus localized thrombophlebitis.  4. Mildly symptomatic anemia, asymptomatic thrombocytopenia chemotherapy-induced.   Case reviewed with Dr. Pierce Crane, who also examined patient.  Plan:  Allisa will receive week 1 cycle 3/4 single agent Taxol given weekly in the neoadjuvant setting for 3 weeks on 1 week off today as scheduled.  We will regroup in 1 week prior to week 2 cycle 3. She knows to contact us in the interim if the need should arise.  Geri Hepler T, PA-C 06/21/12

## 2012-06-23 ENCOUNTER — Telehealth: Payer: Self-pay | Admitting: Oncology

## 2012-06-23 NOTE — Telephone Encounter (Signed)
I called and left a message for Crystal at the cancer center that I am moving the pt up.  I called the patient and gave an appt for 07/08/12 at 1pm.  I asked her to check in at 12:45.  Her last chemo treatment is 08/27 but Dr Donnie Coffin wanted her seen 1 month before the end of her treatment.

## 2012-06-23 NOTE — Telephone Encounter (Signed)
Sydney Cervantes called from dr [facility] office stating that the pt has an appt on 07/08/2012 at [facility]. Per sara she will notify the pt

## 2012-06-28 ENCOUNTER — Ambulatory Visit (HOSPITAL_BASED_OUTPATIENT_CLINIC_OR_DEPARTMENT_OTHER): Payer: Commercial Managed Care - PPO

## 2012-06-28 ENCOUNTER — Encounter: Payer: Self-pay | Admitting: Physician Assistant

## 2012-06-28 ENCOUNTER — Ambulatory Visit (HOSPITAL_BASED_OUTPATIENT_CLINIC_OR_DEPARTMENT_OTHER): Payer: Commercial Managed Care - PPO | Admitting: Physician Assistant

## 2012-06-28 ENCOUNTER — Other Ambulatory Visit (HOSPITAL_BASED_OUTPATIENT_CLINIC_OR_DEPARTMENT_OTHER): Payer: Commercial Managed Care - PPO | Admitting: Lab

## 2012-06-28 VITALS — BP 121/86 | HR 110 | Temp 97.6°F | Ht 68.0 in | Wt 162.1 lb

## 2012-06-28 DIAGNOSIS — C50519 Malignant neoplasm of lower-outer quadrant of unspecified female breast: Secondary | ICD-10-CM

## 2012-06-28 DIAGNOSIS — Z5111 Encounter for antineoplastic chemotherapy: Secondary | ICD-10-CM

## 2012-06-28 DIAGNOSIS — C50912 Malignant neoplasm of unspecified site of left female breast: Secondary | ICD-10-CM

## 2012-06-28 DIAGNOSIS — C50919 Malignant neoplasm of unspecified site of unspecified female breast: Secondary | ICD-10-CM

## 2012-06-28 LAB — CBC WITH DIFFERENTIAL/PLATELET
BASO%: 0.7 % (ref 0.0–2.0)
Basophils Absolute: 0 10*3/uL (ref 0.0–0.1)
Eosinophils Absolute: 0.2 10*3/uL (ref 0.0–0.5)
HCT: 35.7 % (ref 34.8–46.6)
HGB: 12.5 g/dL (ref 11.6–15.9)
LYMPH%: 24.8 % (ref 14.0–49.7)
MONO#: 0.4 10*3/uL (ref 0.1–0.9)
NEUT#: 2.8 10*3/uL (ref 1.5–6.5)
NEUT%: 61.2 % (ref 38.4–76.8)
Platelets: 214 10*3/uL (ref 145–400)
WBC: 4.6 10*3/uL (ref 3.9–10.3)
lymph#: 1.1 10*3/uL (ref 0.9–3.3)

## 2012-06-28 MED ORDER — DIPHENHYDRAMINE HCL 50 MG/ML IJ SOLN: 50.0000 mg | Freq: Once | INTRAMUSCULAR | Status: DC

## 2012-06-28 MED ORDER — PACLITAXEL CHEMO INJECTION 300 MG/50ML
80.0000 mg/m2 | Freq: Once | INTRAVENOUS | Status: AC
  Administered 2012-06-28: 150 mg via INTRAVENOUS
  Filled 2012-06-28: qty 25

## 2012-06-28 MED ORDER — SODIUM CHLORIDE 0.9 % IJ SOLN
10.0000 mL | INTRAMUSCULAR | Status: DC | PRN
  Administered 2012-06-28: 10 mL
  Filled 2012-06-28: qty 10

## 2012-06-28 MED ORDER — HEPARIN SOD (PORK) LOCK FLUSH 100 UNIT/ML IV SOLN
500.0000 [IU] | Freq: Once | INTRAVENOUS | Status: AC | PRN
  Administered 2012-06-28: 500 [IU]
  Filled 2012-06-28: qty 5

## 2012-06-28 MED ORDER — ONDANSETRON 8 MG/50ML IVPB (CHCC)
8.0000 mg | Freq: Once | INTRAVENOUS | Status: AC
  Administered 2012-06-28: 8 mg via INTRAVENOUS

## 2012-06-28 MED ORDER — FAMOTIDINE IN NACL 20-0.9 MG/50ML-% IV SOLN
20.0000 mg | Freq: Once | INTRAVENOUS | Status: AC
  Administered 2012-06-28: 20 mg via INTRAVENOUS

## 2012-06-28 MED ORDER — SODIUM CHLORIDE 0.9 % IV SOLN
Freq: Once | INTRAVENOUS | Status: AC
  Administered 2012-06-28: 15:00:00 via INTRAVENOUS

## 2012-06-28 MED ORDER — DEXAMETHASONE SODIUM PHOSPHATE 10 MG/ML IJ SOLN
10.0000 mg | Freq: Once | INTRAMUSCULAR | Status: AC
  Administered 2012-06-28: 10 mg via INTRAVENOUS

## 2012-06-28 NOTE — Progress Notes (Signed)
Hematology and Oncology Follow Up Visit  Sydney Cervantes [id number] 1961/11/01 51 y.o. 06/28/12   HPI: Sydney Cervantes is a 51 year old [place] woman with a locally advanced, clinical stage T4, N3, infiltrating lobular carcinoma of the left breast with lymph node involvement and diffuse skin thickening. ER/PR positive at 63/5% respectively, HER-2 negative, elevated Ki-67.  She completed 4/6 planned neoadjuvant dose dense FEC with Neulasta support on day 2. 20% dose reduction on 5-FU, 50% dose reduction on epirubicin, 40% dose reduction on Cytoxan, after one-week delay due to anemia/thrombocytopenia/and region of probable localized thrombophlebitis versus mild chemotherapy extravasation on the left forearm, covered with Keflex 500 mg by mouth 3 times a day.  Switched to single agent neoadjuvant Taxol, given 3 weeks on 1 week off, planned 4 cycles, due for week 2, cycle 3/4  today.  2. History of  severe neutropenia with associated fever, despite Neulasta, covered with Cipro 500mg  po BID, Augmentin 875mg  po BID, and 3 doses IV Rocephin 2.0gm on 4/10-4/12/13, now with normalization of WBC.  3. Area on left forearm suggesting possible chemotherapy extravasation versus localized thrombophlebitis.  4. Mildly symptomatic anemia, asymptomatic thrombocytopenia chemotherapy-induced.   Interim History:   Sydney Cervantes is seen today with her husband in accompaniment for followup in anticipation of week 2 cycle 3/4 of neoadjuvant weekly Taxol. She is fatigued, she denies any unexplained fevers or chills though. No shortness of breath or chest pain. No nausea, emesis, diarrhea or constipation problems. She continues to deny any neuropathy symptoms. No bleeding or bruising symptoms.  Her energy levels are adequate without significant fatigue. A detailed review of systems is otherwise noncontributory as noted below.  Review of Systems: Constitutional:  no weight loss, fever, night sweats and feels well Eyes:  uses glasses ENT: no complaints Cardiovascular: no chest pain or dyspnea on exertion Respiratory: no cough, shortness of breath, or wheezing Neurological: no TIA or stroke symptoms Dermatological: negative Gastrointestinal: no abdominal pain, change in bowel habits, or black or bloody stools Genito-Urinary: no dysuria, trouble voiding, or hematuria Hematological and Lymphatic: negative Breast: positive for - known L breast mass Musculoskeletal: negative Remaining ROS negative.   Medications:   I have reviewed the patient's current medications.  Current Outpatient Prescriptions  Medication Sig Dispense Refill  . acetaminophen (TYLENOL) 500 MG tablet Take 500 mg by mouth every 6 (six) hours as needed. For pain/headaches.      . lidocaine-prilocaine (EMLA) cream Apply topically as needed.  30 g  1  . LORazepam (ATIVAN) 0.5 MG tablet Take 1 tablet (0.5 mg total) by mouth every 6 (six) hours as needed (Nausea or vomiting).  30 tablet  0  . ondansetron (ZOFRAN) 8 MG tablet Take 1 tablet two times a day as needed for nausea or vomiting starting on the third day after chemotherapy.  30 tablet  1  . prochlorperazine (COMPAZINE) 10 MG tablet Take 1 tablet (10 mg total) by mouth every 6 (six) hours as needed (Nausea or vomiting).  30 tablet  1  . prochlorperazine (COMPAZINE) 25 MG suppository Place 1 suppository (25 mg total) rectally every 12 (twelve) hours as needed for nausea.  12 suppository  3    Allergies:  Allergies  Allergen Reactions  . Adhesive (Tape) Itching and Rash    Rash and itching at steristrip and EKG lead pad sites    Physical Exam: Filed Vitals:   06/28/12 1324  BP: 121/86  Pulse: 110  Temp: 97.6 F (36.4 C)    Body mass  index is 24.65 kg/(m^2). Weight: 162 lbs. HEENT:  Sclerae anicteric, conjunctivae pink.  Oropharynx clear.  No mucositis or candidiasis.   Nodes:  No cervical, supraclavicular, or axillary lymphadenopathy palpated.  Breast Exam: Deferred. Lungs:   Clear to auscultation bilaterally.  No crackles, rhonchi, or wheezes.   Heart:  Regular rate and rhythm.   Abdomen:  Soft, nontender.  Positive bowel sounds.  No organomegaly or masses palpated.   Musculoskeletal:  No focal spinal tenderness to palpation.  Extremities:  Benign.  No peripheral edema or cyanosis.   Skin: Over the anterior left forearm,  the area previously noted to have ecchymoses and possible superficial thrombophlebitis continues to heal.  No evidence of skin breakdown.   Neuro:  Nonfocal, alert and oriented x 3.   Lab Results: Lab Results  Component Value Date   WBC 4.6 06/28/2012   HGB 12.5 06/28/2012   HCT 35.7 06/28/2012   MCV 97.5 06/28/2012   PLT 214 06/28/2012   NEUTROABS 2.8 06/28/2012     Chemistry      Component Value Date/Time   NA 142 06/21/2012 1259   K 4.0 06/21/2012 1259   CL 105 06/21/2012 1259   CO2 29 06/21/2012 1259   BUN 13 06/21/2012 1259   CREATININE 0.55 06/21/2012 1259      Component Value Date/Time   CALCIUM 9.4 06/21/2012 1259   ALKPHOS 62 06/21/2012 1259   AST 42* 06/21/2012 1259   ALT 45* 06/21/2012 1259   BILITOT 0.5 06/21/2012 1259      Lab Results  Component Value Date   LABCA2 56* 03/08/2012   Radiographic data: 05/06/12 BILATERAL BREAST MRI WITH AND WITHOUT CONTRAST  Technique: Multiplanar, multisequence MR images of both breasts were obtained prior to and following the intravenous administration of 14ml of Multihance. Three dimensional images were evaluated at the independent DynaCad workstation.  Comparison: MRI 01/22/2012 Findings: There continues to be skin thickening and skin enhancement of the left breast. Diffuse abnormal enhancement is seen throughout the central portion of the left breast involving all four quadrants. Enhancement is significantly more ill-defined and less mass-like in appearance. Enhancement measures approximately 8.0 x 7.5 x 4.5 cm. Along the inferior aspect of the breast, a more mass-like area of enhancement  measures 1.6 x 1.9 x 1.5 cm, also seen on the previous exam and adjacent to the clip artifact from prior biopsy. Despite the similar measurements to the previous exam, there has been significant improvement based on enhancement kinetics. The contour of the left breast is improved with less nipple retraction. Previously, there was enhancement on the anterior aspect of the left pectoralis muscle, no longer apparent.  Multiple enlarged left axillary lymph nodes are identified on the  prior study. Nodes are now normal in size and morphology in the  left axilla. On the right, background parenchymal enhancement is minimal. No suspicious enhancement is identified. Right axillary lymph nodes are normal in morphology. The patient has a right-sided Port-A- Cath.  IMPRESSION:  1. Significant improvement in the enhancement kinetics involving  the central aspect of the left breast.  2. Significant improvement in the contour of the left breast with  less nipple retraction.  3. Significant improvement in left axillary adenopathy. No  axillary lymph nodes appear suspicious on the current study.  4. There is no longer suspicious enhancement along the anterior  aspect of the left pectoralis muscle.  THREE-DIMENSIONAL MR IMAGE RENDERING ON INDEPENDENT WORKSTATION:  Three-dimensional MR images were rendered by post-processing of the original  MR data on an independent workstation. The three-  dimensional MR images were interpreted, and findings were reported in the accompanying complete MRI report for this study.  BI-RADS CATEGORY 6: Known biopsy-proven malignancy - appropriate action should be taken.  Original Report Authenticated By: Patterson Hammersmith, M.D.  Assessment:  Sydney Cervantes is a 51 year old [place] woman with a locally advanced, clinical stage T4, N3, infiltrating lobular carcinoma of the left breast with lymph node involvement and diffuse skin thickening. ER/PR positive at 63/5%  respectively, HER-2 negative, elevated Ki-67.  She completed 4/6 planned neoadjuvant dose dense FEC with Neulasta support on day 2. 20% dose reduction on 5-FU, 50% dose reduction on epirubicin, 40% dose reduction on Cytoxan, after one-week delay due to anemia/thrombocytopenia/and region of probable localized thrombophlebitis versus mild chemotherapy extravasation on the left forearm, covered with Keflex 500 mg by mouth 3 times a day.  Switched to single agent neoadjuvant Taxol, given 3 weeks on 1 week off, planned 4 cycles, due for week 2, cycle 3/4 today.  2. History of  severe neutropenia with associated fever, despite Neulasta, covered with Cipro 500mg  po BID, Augmentin 875mg  po BID, and 3 doses IV Rocephin 2.0gm on 4/10-4/12/13, now with normalization of WBC.  3. Area on left forearm suggesting possible chemotherapy extravasation versus localized thrombophlebitis.  4. Mildly symptomatic anemia, asymptomatic thrombocytopenia chemotherapy-induced.   Case reviewed with Dr. Pierce Crane.  Plan:  Sydney Cervantes will receive week 1 cycle 3/4 single agent Taxol given weekly in the neoadjuvant setting for 3 weeks on 1 week off today as scheduled.  We will regroup in 1 week prior to week 3 cycle 3. She knows to contact us in the interim if the need should arise.  Albin Duckett T, PA-C 06/28/12

## 2012-06-28 NOTE — Patient Instructions (Signed)
[facility] Discharge Instructions for Patients Receiving Chemotherapy  Today you received the following chemotherapy agents taxol  To help prevent nausea and vomiting after your treatment, we encourage you to take your nausea medication   If you develop nausea and vomiting that is not controlled by your nausea medication, call the clinic. If it is after clinic hours your family physician or the after hours number for the clinic or go to the Emergency Department.   BELOW ARE SYMPTOMS THAT SHOULD BE REPORTED IMMEDIATELY:  *FEVER GREATER THAN 100.5 F  *CHILLS WITH OR WITHOUT FEVER  NAUSEA AND VOMITING THAT IS NOT CONTROLLED WITH YOUR NAUSEA MEDICATION  *UNUSUAL SHORTNESS OF BREATH  *UNUSUAL BRUISING OR BLEEDING  TENDERNESS IN MOUTH AND THROAT WITH OR WITHOUT PRESENCE OF ULCERS  *URINARY PROBLEMS  *BOWEL PROBLEMS  UNUSUAL RASH Items with * indicate a potential emergency and should be followed up as soon as possible.   Please let the nurse know about any problems that you may have experienced. Feel free to call the clinic you have any questions or concerns. The clinic phone number is [phone].   I have been informed and understand all the instructions given to me. I know to contact the clinic, my physician, or go to the Emergency Department if any problems should occur. I do not have any questions at this time, but understand that I may call the clinic during office hours   should I have any questions or need assistance in obtaining follow up care.    __________________________________________  _____________  __________ Signature of Patient or Authorized Representative            Date                   Time    __________________________________________ Nurse's Signature

## 2012-07-04 ENCOUNTER — Other Ambulatory Visit: Payer: Self-pay | Admitting: Family

## 2012-07-04 DIAGNOSIS — C50519 Malignant neoplasm of lower-outer quadrant of unspecified female breast: Secondary | ICD-10-CM

## 2012-07-05 ENCOUNTER — Other Ambulatory Visit (HOSPITAL_BASED_OUTPATIENT_CLINIC_OR_DEPARTMENT_OTHER): Payer: Commercial Managed Care - PPO | Admitting: Lab

## 2012-07-05 ENCOUNTER — Ambulatory Visit (HOSPITAL_BASED_OUTPATIENT_CLINIC_OR_DEPARTMENT_OTHER): Payer: Commercial Managed Care - PPO

## 2012-07-05 ENCOUNTER — Ambulatory Visit (HOSPITAL_BASED_OUTPATIENT_CLINIC_OR_DEPARTMENT_OTHER): Payer: Commercial Managed Care - PPO | Admitting: Family

## 2012-07-05 VITALS — BP 112/84 | HR 109 | Temp 98.8°F | Ht 68.0 in | Wt 163.6 lb

## 2012-07-05 DIAGNOSIS — C50919 Malignant neoplasm of unspecified site of unspecified female breast: Secondary | ICD-10-CM

## 2012-07-05 DIAGNOSIS — C773 Secondary and unspecified malignant neoplasm of axilla and upper limb lymph nodes: Secondary | ICD-10-CM

## 2012-07-05 DIAGNOSIS — C50519 Malignant neoplasm of lower-outer quadrant of unspecified female breast: Secondary | ICD-10-CM

## 2012-07-05 DIAGNOSIS — Z5111 Encounter for antineoplastic chemotherapy: Secondary | ICD-10-CM

## 2012-07-05 LAB — CBC WITH DIFFERENTIAL/PLATELET
Basophils Absolute: 0 10*3/uL (ref 0.0–0.1)
Eosinophils Absolute: 0.2 10*3/uL (ref 0.0–0.5)
HCT: 32.5 % — ABNORMAL LOW (ref 34.8–46.6)
HGB: 11 g/dL — ABNORMAL LOW (ref 11.6–15.9)
LYMPH%: 24.9 % (ref 14.0–49.7)
MONO#: 0.3 10*3/uL (ref 0.1–0.9)
NEUT#: 2.1 10*3/uL (ref 1.5–6.5)
NEUT%: 62.2 % (ref 38.4–76.8)
Platelets: 177 10*3/uL (ref 145–400)
RBC: 3.29 10*6/uL — ABNORMAL LOW (ref 3.70–5.45)
WBC: 3.4 10*3/uL — ABNORMAL LOW (ref 3.9–10.3)
nRBC: 0 % (ref 0–0)

## 2012-07-05 MED ORDER — SODIUM CHLORIDE 0.9 % IV SOLN
Freq: Once | INTRAVENOUS | Status: AC
  Administered 2012-07-05: 14:00:00 via INTRAVENOUS

## 2012-07-05 MED ORDER — DEXAMETHASONE SODIUM PHOSPHATE 10 MG/ML IJ SOLN
10.0000 mg | Freq: Once | INTRAMUSCULAR | Status: AC
  Administered 2012-07-05: 10 mg via INTRAVENOUS

## 2012-07-05 MED ORDER — HEPARIN SOD (PORK) LOCK FLUSH 100 UNIT/ML IV SOLN
500.0000 [IU] | Freq: Once | INTRAVENOUS | Status: AC | PRN
  Administered 2012-07-05: 500 [IU]
  Filled 2012-07-05: qty 5

## 2012-07-05 MED ORDER — DIPHENHYDRAMINE HCL 50 MG/ML IJ SOLN
50.0000 mg | Freq: Once | INTRAMUSCULAR | Status: AC
  Administered 2012-07-05: 50 mg via INTRAVENOUS

## 2012-07-05 MED ORDER — ONDANSETRON 8 MG/50ML IVPB (CHCC)
8.0000 mg | Freq: Once | INTRAVENOUS | Status: AC
  Administered 2012-07-05: 8 mg via INTRAVENOUS

## 2012-07-05 MED ORDER — PACLITAXEL CHEMO INJECTION 300 MG/50ML
80.0000 mg/m2 | Freq: Once | INTRAVENOUS | Status: AC
  Administered 2012-07-05: 150 mg via INTRAVENOUS
  Filled 2012-07-05: qty 25

## 2012-07-05 MED ORDER — FAMOTIDINE IN NACL 20-0.9 MG/50ML-% IV SOLN
20.0000 mg | Freq: Once | INTRAVENOUS | Status: AC
  Administered 2012-07-05: 20 mg via INTRAVENOUS

## 2012-07-05 MED ORDER — SODIUM CHLORIDE 0.9 % IJ SOLN
10.0000 mL | INTRAMUSCULAR | Status: DC | PRN
  Administered 2012-07-05: 10 mL
  Filled 2012-07-05: qty 10

## 2012-07-05 NOTE — Patient Instructions (Signed)
[facility] Discharge Instructions for Patients Receiving Chemotherapy  Today you received the following chemotherapy agents Taxol  To help prevent nausea and vomiting after your treatment, we encourage you to take your nausea medication as prescribed.  If you develop nausea and vomiting that is not controlled by your nausea medication, call the clinic. If it is after clinic hours your family physician or the after hours number for the clinic or go to the Emergency Department.   BELOW ARE SYMPTOMS THAT SHOULD BE REPORTED IMMEDIATELY:  *FEVER GREATER THAN 100.5 F  *CHILLS WITH OR WITHOUT FEVER  NAUSEA AND VOMITING THAT IS NOT CONTROLLED WITH YOUR NAUSEA MEDICATION  *UNUSUAL SHORTNESS OF BREATH  *UNUSUAL BRUISING OR BLEEDING  TENDERNESS IN MOUTH AND THROAT WITH OR WITHOUT PRESENCE OF ULCERS  *URINARY PROBLEMS  *BOWEL PROBLEMS  UNUSUAL RASH Items with * indicate a potential emergency and should be followed up as soon as possible.  One of the nurses will contact you 24 hours after your treatment. Please let the nurse know about any problems that you may have experienced. Feel free to call the clinic you have any questions or concerns. The clinic phone number is [phone].   I have been informed and understand all the instructions given to me. I know to contact the clinic, my physician, or go to the Emergency Department if any problems should occur. I do not have any questions at this time, but understand that I may call the clinic during office hours   should I have any questions or need assistance in obtaining follow up care.    __________________________________________  _____________  __________ Signature of Patient or Authorized Representative            Date                   Time    __________________________________________ Nurse's Signature    

## 2012-07-06 ENCOUNTER — Encounter: Payer: Self-pay | Admitting: Family

## 2012-07-06 NOTE — Progress Notes (Signed)
Hematology and Oncology Follow Up Visit  YOMARIS PALECEK [id number] [birth year] 51 y.o. 06/28/12   HPI: 51 year old [place] woman with: 1. 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 ER/PR positive at 63/5% respectively, HER-2 negative, elevated Ki-67.  Completed 4/6 planned neoadjuvant dose dense FEC with Neulasta support on day 2. 20% dose reduction on 5-FU, 50% dose reduction on epirubicin, 40% dose reduction on Cytoxan, after one-week delay due to anemia/thrombocytopenia/and region of probable localized thrombophlebitis versus mild chemotherapy extravasation on the left forearm, covered with Keflex 500 mg by mouth 3 times a day.  Switched to single agent neoadjuvant Taxol, given 3 weeks on 1 week off, planned 4 cycles, due for week 3, cycle 3/4  today. 2. History of severe neutropenia with associated fever, despite Neulasta, covered with Cipro 500mg  po BID, Augmentin 875mg  po BID, and 3 doses IV Rocephin 2.0gm on 4/10-4/12/13. 3. Area on left forearm suggesting possible chemotherapy extravasation versus localized thrombophlebitis. 4. Mild asymptomatic anemia, chemotherapy-induced. 5. BRCA negative, awaiting BART.   Interim History:   Here for week 3 cycle 3/4 of neoadjuvant weekly Taxol. She is fatigued, denies unexplained fevers or chills. No headache or blurred vision. No cough or shortness of breath. No abdominal pain or new bone pain. Bowel and bladder function are normal. Appetite is good, with adequate fluid intake.  Denies neuropathy symptoms. No bleeding or bruising symptoms. Reports arthralgias and myalgias day 2-3 following chemotherapy. Remainder of the 10 point  review of systems is negative.   Medications:   I have reviewed the patient's current medications.  Allergies:  Allergies  Allergen Reactions  . Adhesive (Tape) Itching and Rash    Rash and itching at  steristrip and EKG lead pad sites   Physical Exam: Filed Vitals:   07/05/12 1319  BP: 112/84  Pulse: 109  Temp: 98.8 F (37.1 C)    Body mass index is 24.88 kg/(m^2). Weight: 162 lbs. General: Well developed, well nourished, in no acute distress.  EENT: No ocular or oral lesions. No stomatitis.  Respiratory: Lungs are clear to auscultation bilaterally with normal respiratory movement and no accessory muscle use. Cardiac: No murmur, rub or tachycardia. No upper or lower extremity edema.  GI: Abdomen is soft, no palpable hepatosplenomegaly. No fluid wave. No tenderness. Musculoskeletal: No kyphosis, no tenderness over the spine, ribs or hips. Lymph: No cervical, infraclavicular, axillary or inguinal adenopathy. Neuro: No focal neurological deficits. Psych: Alert and oriented X 3, appropriate mood and affect.  Skin: Left forearm, a 2 cm x 1.5 cm indurated area with hyperpigmentation.  BREAST EXAM: In the supine position, with the right arm over the head, the right nipple is everted. No periareolar edema or nipple discharge. No mass in any quadrant or subareolar region. No redness of the skin. No right axillary adenopathy. With the left arm over the head, the left nipple is slightly flattened. No periareolar edema or nipple discharge. Middle third of the breast from 3 o'clock to 9 o'clock position, a firm tethered mass, approximately 6 cm. Mild redness of the skin.   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   Plan:  1. Nohea will receive week 3 cycle 3/4 single agent Taxol given weekly in the neoadjuvant setting for 3 weeks on 1 week off today as  scheduled.  She knows to contact us in the interim if the need should arise. 2. Appt with Dr. Ezzard Standing 8/2 to discuss surgery.  3. Appt with Dr. Donnie Coffin 8/12 prior to Day 1 Cycle 4 Taxol.  4. I will check with Maylon Cos, genetics, regarding BART results. It will be important to have that prior to her appt with Dr. Ezzard Standing 8/2.   Colman Cater, FNP-C 06/28/12

## 2012-07-06 NOTE — Patient Instructions (Signed)
1. Will receive week 3 cycle 3/4 single agent Taxol given weekly in the neoadjuvant setting for 3 weeks on 1 week off today as scheduled.   2. Appt with Dr. Ezzard Standing 8/2 to discuss surgery.  3. Appt with Dr. Donnie Coffin 8/12 prior to Day 1 Cycle 4 Taxol.  4. I will check with Maylon Cos, genetics, regarding BART results. It will be important to have that prior to your appt with Dr. Ezzard Standing 8/2.

## 2012-07-07 ENCOUNTER — Telehealth (INDEPENDENT_AMBULATORY_CARE_PROVIDER_SITE_OTHER): Payer: Self-pay

## 2012-07-07 NOTE — Telephone Encounter (Signed)
Message copied by Ivory Broad on Thu Jul 07, 2012  3:28 PM ------      Message from: Milas Hock      Created: Thu Jul 07, 2012  3:13 PM       Pls call Sharyl Nimrod at [facility] about patient. [phone].            Cyndra Numbers

## 2012-07-07 NOTE — Telephone Encounter (Signed)
I returned Sydney Cervantes's call.  Since the pt's genetic results won't be in yet they were asking should we wait for that and reschedule her appointment.  Her results are expected around 8/6.  I told her let's keep the appointment and we can make tentative plans that can be changed.  Her last chemo treatment is 8/27 and they would like to schedule surgery for after labor day.

## 2012-07-08 ENCOUNTER — Encounter (INDEPENDENT_AMBULATORY_CARE_PROVIDER_SITE_OTHER): Payer: Self-pay | Admitting: Surgery

## 2012-07-08 ENCOUNTER — Ambulatory Visit (INDEPENDENT_AMBULATORY_CARE_PROVIDER_SITE_OTHER): Payer: Commercial Managed Care - PPO | Admitting: Surgery

## 2012-07-08 VITALS — BP 120/82 | HR 60 | Temp 96.8°F | Resp 18 | Ht 68.0 in | Wt 162.0 lb

## 2012-07-08 DIAGNOSIS — C50519 Malignant neoplasm of lower-outer quadrant of unspecified female breast: Secondary | ICD-10-CM

## 2012-07-08 NOTE — Progress Notes (Addendum)
Re:   Sydney Cervantes DOB:   28-Oct-1961 MRN:   [id number]  [facility]  ASSESSMENT AND PLAN: 1.  Left breast cancer.  T4, N3, Mx, at 11 o'clock.  [place] - 4.5 cm, MRI - 7.6 cm extending to pectoralis muscle.  Lobular, grade 3.  ER - 62%, PR - 5%, Her@Neu  - neg., Ki67 - 33  Left axillary node biopsy positive.  Treating oncology - Rubin/Wentworth.  To finish chemotx - 08/02/2012,  Results from genetics to be back 07/12/2012.  Will schedule left MRM (she needs an open MRI secondary to claustrophobia) in mid September.  Plan repeat MRI after last chemotx, though it will not affect planned surgery.  I reviewed with the patient and her husband the indications and complications of modified radical mastectomy, including bleeding, infection, nerve injury and lymphedema.  We talked about reconstruction, but this will need to be delayed because of planned rad tx.  I will see her back about 1 week pre op to review any final questions.   2.  Level 1, 2 and 3 nodes positive - by MRI. 3.  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  4.  Power port - 02/11/2012.  5.  BRCA 1/2 neg - April 2013. 6.  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 7.  Left forearm wound from extravasation. 8.  Genetics - Her BRCA and BART were negative.   CancerNext panel (14 genes) was negative.  07/14/2012   REFERRING PHYSICIAN:  Dag Pavic, [facility].  HISTORY OF PRESENT ILLNESS: Sydney Cervantes is a 51 y.o. (DOB: May 16, 1951)  whtie female whose primary care physician is Debbora Dus, NP, [facility], and comes to me today for follow up of left breast cancert.  She is getting neoadjuvant therapy.  She is to finish her chemotx 8/27.  Husband is with her.  We will plan her surgery to about 2 to 4 weeks after her last treatment.  I reviewed the planned operation.  Her mother had breast cancer in her 8's.  She then had a recurrence.  She is still living.      Past Medical History  Diagnosis Date  . Hx of colonoscopy 2000  .  Wears glasses   . Breast cancer      Current Outpatient Prescriptions  Medication Sig Dispense Refill  . acetaminophen (TYLENOL) 500 MG tablet Take 500 mg by mouth every 6 (six) hours as needed. For pain/headaches.      . lidocaine-prilocaine (EMLA) cream Apply topically as needed.  30 g  1  . LORazepam (ATIVAN) 0.5 MG tablet Take 1 tablet (0.5 mg total) by mouth every 6 (six) hours as needed (Nausea or vomiting).  30 tablet  0  . ondansetron (ZOFRAN) 8 MG tablet Take 1 tablet two times a day as needed for nausea or vomiting starting on the third day after chemotherapy.  30 tablet  1  . prochlorperazine (COMPAZINE) 10 MG tablet Take 1 tablet (10 mg total) by mouth every 6 (six) hours as needed (Nausea or vomiting).  30 tablet  1  . prochlorperazine (COMPAZINE) 25 MG suppository Place 1 suppository (25 mg total) rectally every 12 (twelve) hours as needed for nausea.  12 suppository  3     Allergies  Allergen Reactions  . Adhesive (Tape) Itching and Rash    Rash and itching at steristrip and EKG lead pad sites    REVIEW OF SYSTEMS:  Gastrointestinal: Lap chole - 2000's by Dr.  Toth, Appendiceal carcinoid - 1987, last colonoscopy about 2000.  SOCIAL and FAMILY HISTORY: Married.  Husband, [name], with patient. They have 3 daughters - 22,19, 1. She works for [employer] for [employer].  PHYSICAL EXAM: BP 120/82  Pulse 60  Temp 96.8 F (36 C) (Temporal)  Resp 18  Ht 5\' 8"  (1.727 m)  Wt 162 lb (73.483 kg)  BMI 24.63 kg/m2  General: WN WF who is alert and generally healthy appearing.  HEENT: Normal. Pupils equal. Good dentition. Neck: Supple. No mass.  No thyroid mass.   Lymph Nodes:  No supraclavicular, cervical, or axillary nodes.  I am not sure I can feel a positive note. Breasts:  Left:  Left breast mass - Dimpling at 5 o'clock position. But there is less mass effect. While in the office, I did an US of the left breast to try to document the size of the tumor. The  overall tumor is less prominent to me.  She still has dimpling in the inferior breast..  Right - unremarkable.  (AC off in office today)  DATA REVIEWED: Sydney Cervantes's notes.  Ovidio Kin, MD,  [facility] [address]  [address]   [place], [place]    [address] Phone:  [phone] FAX:  [phone]

## 2012-07-08 NOTE — Addendum Note (Signed)
Addended byIvory Broad on: 07/08/2012 01:56 PM   Modules accepted: Orders

## 2012-07-12 ENCOUNTER — Telehealth: Payer: Self-pay | Admitting: Genetic Counselor

## 2012-07-12 NOTE — Telephone Encounter (Signed)
Revealed negative BreastNext test to patient.

## 2012-07-13 ENCOUNTER — Encounter: Payer: Self-pay | Admitting: Genetic Counselor

## 2012-07-18 ENCOUNTER — Ambulatory Visit (HOSPITAL_BASED_OUTPATIENT_CLINIC_OR_DEPARTMENT_OTHER): Payer: Commercial Managed Care - PPO | Admitting: Oncology

## 2012-07-18 ENCOUNTER — Ambulatory Visit (HOSPITAL_BASED_OUTPATIENT_CLINIC_OR_DEPARTMENT_OTHER): Payer: Commercial Managed Care - PPO

## 2012-07-18 ENCOUNTER — Other Ambulatory Visit (HOSPITAL_BASED_OUTPATIENT_CLINIC_OR_DEPARTMENT_OTHER): Payer: Commercial Managed Care - PPO | Admitting: Lab

## 2012-07-18 VITALS — BP 106/73 | HR 96 | Temp 99.1°F | Resp 20 | Ht 68.0 in | Wt 163.5 lb

## 2012-07-18 DIAGNOSIS — T451X5A Adverse effect of antineoplastic and immunosuppressive drugs, initial encounter: Secondary | ICD-10-CM

## 2012-07-18 DIAGNOSIS — C773 Secondary and unspecified malignant neoplasm of axilla and upper limb lymph nodes: Secondary | ICD-10-CM

## 2012-07-18 DIAGNOSIS — Z5111 Encounter for antineoplastic chemotherapy: Secondary | ICD-10-CM

## 2012-07-18 DIAGNOSIS — C50519 Malignant neoplasm of lower-outer quadrant of unspecified female breast: Secondary | ICD-10-CM

## 2012-07-18 DIAGNOSIS — D6481 Anemia due to antineoplastic chemotherapy: Secondary | ICD-10-CM

## 2012-07-18 DIAGNOSIS — D6959 Other secondary thrombocytopenia: Secondary | ICD-10-CM

## 2012-07-18 LAB — CBC WITH DIFFERENTIAL/PLATELET
Basophils Absolute: 0 10*3/uL (ref 0.0–0.1)
EOS%: 3.4 % (ref 0.0–7.0)
Eosinophils Absolute: 0.1 10*3/uL (ref 0.0–0.5)
HGB: 11.5 g/dL — ABNORMAL LOW (ref 11.6–15.9)
LYMPH%: 20.4 % (ref 14.0–49.7)
MCH: 33 pg (ref 25.1–34.0)
MCV: 97.7 fL (ref 79.5–101.0)
MONO%: 21.8 % — ABNORMAL HIGH (ref 0.0–14.0)
Platelets: 176 10*3/uL (ref 145–400)
RDW: 14.9 % — ABNORMAL HIGH (ref 11.2–14.5)

## 2012-07-18 MED ORDER — SODIUM CHLORIDE 0.9 % IV SOLN
Freq: Once | INTRAVENOUS | Status: AC
  Administered 2012-07-18: 13:00:00 via INTRAVENOUS

## 2012-07-18 MED ORDER — DIPHENHYDRAMINE HCL 50 MG/ML IJ SOLN
50.0000 mg | Freq: Once | INTRAMUSCULAR | Status: AC
  Administered 2012-07-18: 50 mg via INTRAVENOUS

## 2012-07-18 MED ORDER — PACLITAXEL CHEMO INJECTION 300 MG/50ML
80.0000 mg/m2 | Freq: Once | INTRAVENOUS | Status: AC
  Administered 2012-07-18: 150 mg via INTRAVENOUS
  Filled 2012-07-18: qty 25

## 2012-07-18 MED ORDER — FAMOTIDINE IN NACL 20-0.9 MG/50ML-% IV SOLN
20.0000 mg | Freq: Once | INTRAVENOUS | Status: AC
  Administered 2012-07-18: 20 mg via INTRAVENOUS

## 2012-07-18 MED ORDER — ONDANSETRON 8 MG/50ML IVPB (CHCC)
8.0000 mg | Freq: Once | INTRAVENOUS | Status: AC
  Administered 2012-07-18: 8 mg via INTRAVENOUS

## 2012-07-18 MED ORDER — HEPARIN SOD (PORK) LOCK FLUSH 100 UNIT/ML IV SOLN
500.0000 [IU] | Freq: Once | INTRAVENOUS | Status: AC | PRN
  Administered 2012-07-18: 500 [IU]
  Filled 2012-07-18: qty 5

## 2012-07-18 MED ORDER — DEXAMETHASONE SODIUM PHOSPHATE 10 MG/ML IJ SOLN
10.0000 mg | Freq: Once | INTRAMUSCULAR | Status: AC
  Administered 2012-07-18: 10 mg via INTRAVENOUS

## 2012-07-18 MED ORDER — SODIUM CHLORIDE 0.9 % IJ SOLN
10.0000 mL | INTRAMUSCULAR | Status: DC | PRN
  Administered 2012-07-18: 10 mL
  Filled 2012-07-18: qty 10

## 2012-07-18 NOTE — Patient Instructions (Signed)
Cancer Center Discharge Instructions for Patients Receiving Chemotherapy  Today you received the following chemotherapy agents Taxol  To help prevent nausea and vomiting after your treatment, we encourage you to take your nausea medication as prescribed.  If you develop nausea and vomiting that is not controlled by your nausea medication, call the clinic. If it is after clinic hours your family physician or the after hours number for the clinic or go to the Emergency Department.   BELOW ARE SYMPTOMS THAT SHOULD BE REPORTED IMMEDIATELY:  *FEVER GREATER THAN 100.5 F  *CHILLS WITH OR WITHOUT FEVER  NAUSEA AND VOMITING THAT IS NOT CONTROLLED WITH YOUR NAUSEA MEDICATION  *UNUSUAL SHORTNESS OF BREATH  *UNUSUAL BRUISING OR BLEEDING  TENDERNESS IN MOUTH AND THROAT WITH OR WITHOUT PRESENCE OF ULCERS  *URINARY PROBLEMS  *BOWEL PROBLEMS  UNUSUAL RASH Items with * indicate a potential emergency and should be followed up as soon as possible.  One of the nurses will contact you 24 hours after your treatment. Please let the nurse know about any problems that you may have experienced. Feel free to call the clinic you have any questions or concerns. The clinic phone number is [phone].   I have been informed and understand all the instructions given to me. I know to contact the clinic, my physician, or go to the Emergency Department if any problems should occur. I do not have any questions at this time, but understand that I may call the clinic during office hours   should I have any questions or need assistance in obtaining follow up care.    __________________________________________  _____________  __________ Signature of Patient or Authorized Representative            Date                   Time    __________________________________________ Nurse's Signature    

## 2012-07-18 NOTE — Progress Notes (Signed)
Hematology and Oncology Follow Up Visit  SHEALA DOSH [id number] 1961-07-21 51 y.o. 07/19/13   HPI: Mersadez is a 51 year old [place] woman with a locally advanced, clinical stage T4, N3, infiltrating lobular carcinoma of the left breast with lymph node involvement and diffuse skin thickening. ER/PR positive at 63/5% respectively, HER-2 negative, elevated Ki-67.  She completed 4/6 planned neoadjuvant dose dense FEC with Neulasta support on day 2. 20% dose reduction on 5-FU, 50% dose reduction on epirubicin, 40% dose reduction on Cytoxan, after one-week delay due to anemia/thrombocytopenia/and region of probable localized thrombophlebitis versus mild chemotherapy extravasation on the left forearm, covered with Keflex 500 mg by mouth 3 times a day.  Switched to single agent neoadjuvant Taxol, given 3 weeks on 1 week off, planned 4 cycles, due for week 1 , cycle 4/4  today.    2.. Area on left forearm suggesting possible chemotherapy extravasation versus localized thrombophlebitis.  3.. Mildly symptomatic anemia, asymptomatic thrombocytopenia chemotherapy-induced.   Interim History:   Patient returns for followup she is doing well. She she is anxious to begin her last cycle of Taxol. She is seen the surgeon. She anticipates having a mastectomy with delayed reconstruction. She denies any significant problems with numbness tingling. A detailed review of systems is otherwise noncontributory as noted below.  Review of Systems: Constitutional:  no weight loss, fever, night sweats and feels well Eyes: uses glasses ENT: no complaints Cardiovascular: no chest pain or dyspnea on exertion Respiratory: no cough, shortness of breath, or wheezing Neurological: no TIA or stroke symptoms Dermatological: negative Gastrointestinal: no abdominal pain, change in bowel habits, or black or bloody stools Genito-Urinary: no dysuria, trouble voiding, or hematuria Hematological and Lymphatic:  negative Breast: positive for - known L breast mass Musculoskeletal: negative Remaining ROS negative.   Medications:   I have reviewed the patient's current medications.  Current Outpatient Prescriptions  Medication Sig Dispense Refill  . lidocaine-prilocaine (EMLA) cream Apply topically as needed.  30 g  1  . acetaminophen (TYLENOL) 500 MG tablet Take 500 mg by mouth every 6 (six) hours as needed. For pain/headaches.      Marland Kitchen LORazepam (ATIVAN) 0.5 MG tablet Take 1 tablet (0.5 mg total) by mouth every 6 (six) hours as needed (Nausea or vomiting).  30 tablet  0  . ondansetron (ZOFRAN) 8 MG tablet Take 1 tablet two times a day as needed for nausea or vomiting starting on the third day after chemotherapy.  30 tablet  1  . prochlorperazine (COMPAZINE) 10 MG tablet Take 1 tablet (10 mg total) by mouth every 6 (six) hours as needed (Nausea or vomiting).  30 tablet  1  . prochlorperazine (COMPAZINE) 25 MG suppository Place 1 suppository (25 mg total) rectally every 12 (twelve) hours as needed for nausea.  12 suppository  3    Allergies:  Allergies  Allergen Reactions  . Adhesive (Tape) Itching and Rash    Rash and itching at steristrip and EKG lead pad sites    Physical Exam: Filed Vitals:   07/18/12 1122  BP: 106/73  Pulse: 96  Temp: 99.1 F (37.3 C)  Resp: 20    Body mass index is 24.86 kg/(m^2). Weight: 162 lbs. HEENT:  Sclerae anicteric, conjunctivae pink.  Oropharynx clear.  No mucositis or candidiasis.   Nodes:  No cervical, supraclavicular, or axillary lymphadenopathy palpated.  Breast Exam: I cannot appreciate any masses in her left breast. Lungs:  Clear to auscultation bilaterally.  No crackles, rhonchi, or wheezes.  Heart:  Regular rate and rhythm.   Abdomen:  Soft, nontender.  Positive bowel sounds.  No organomegaly or masses palpated.   Musculoskeletal:  No focal spinal tenderness to palpation.  Extremities:  Benign.  No peripheral edema or cyanosis.   Skin: Over the  anterior left forearm,  the area previously noted to have ecchymoses and possible superficial thrombophlebitis continues to heal.  No evidence of skin breakdown.   Neuro:  Nonfocal, alert and oriented x 3.   Lab Results: Lab Results  Component Value Date   WBC 3.5* 07/18/2012   HGB 11.5* 07/18/2012   HCT 34.1* 07/18/2012   MCV 97.7 07/18/2012   PLT 176 07/18/2012   NEUTROABS 1.9 07/18/2012     Chemistry      Component Value Date/Time   NA 142 06/21/2012 1259   K 4.0 06/21/2012 1259   CL 105 06/21/2012 1259   CO2 29 06/21/2012 1259   BUN 13 06/21/2012 1259   CREATININE 0.55 06/21/2012 1259      Component Value Date/Time   CALCIUM 9.4 06/21/2012 1259   ALKPHOS 62 06/21/2012 1259   AST 42* 06/21/2012 1259   ALT 45* 06/21/2012 1259   BILITOT 0.5 06/21/2012 1259      Lab Results  Component Value Date   LABCA2 56* 03/08/2012   Radiographic data: 05/06/12 BILATERAL BREAST MRI WITH AND WITHOUT CONTRAST  Technique: Multiplanar, multisequence MR images of both breasts were obtained prior to and following the intravenous administration of 14ml of Multihance. Three dimensional images were evaluated at the independent DynaCad workstation.  Comparison: MRI 01/22/2012 Findings: There continues to be skin thickening and skin enhancement of the left breast. Diffuse abnormal enhancement is seen throughout the central portion of the left breast involving all four quadrants. Enhancement is significantly more ill-defined and less mass-like in appearance. Enhancement measures approximately 8.0 x 7.5 x 4.5 cm. Along the inferior aspect of the breast, a more mass-like area of enhancement measures 1.6 x 1.9 x 1.5 cm, also seen on the previous exam and adjacent to the clip artifact from prior biopsy. Despite the similar measurements to the previous exam, there has been significant improvement based on enhancement kinetics. The contour of the left breast is improved with less nipple retraction. Previously, there was  enhancement on the anterior aspect of the left pectoralis muscle, no longer apparent.  Multiple enlarged left axillary lymph nodes are identified on the  prior study. Nodes are now normal in size and morphology in the  left axilla. On the right, background parenchymal enhancement is minimal. No suspicious enhancement is identified. Right axillary lymph nodes are normal in morphology. The patient has a right-sided Port-A- Cath.  IMPRESSION:  1. Significant improvement in the enhancement kinetics involving  the central aspect of the left breast.  2. Significant improvement in the contour of the left breast with  less nipple retraction.  3. Significant improvement in left axillary adenopathy. No  axillary lymph nodes appear suspicious on the current study.  4. There is no longer suspicious enhancement along the anterior  aspect of the left pectoralis muscle.  THREE-DIMENSIONAL MR IMAGE RENDERING ON INDEPENDENT WORKSTATION:  Three-dimensional MR images were rendered by post-processing of the original MR data on an independent workstation. The three-  dimensional MR images were interpreted, and findings were reported in the accompanying complete MRI report for this study.  BI-RADS CATEGORY 6: Known biopsy-proven malignancy - appropriate action should be taken.  Original Report Authenticated By: Patterson Hammersmith, M.D.  Assessment:  Yameli is a 51 year old [place] woman with a locally advanced, clinical stage T4, N3, infiltrating lobular carcinoma of the left breast with lymph node involvement and diffuse skin thickening. ER/PR positive at 63/5% respectively, HER-2 negative, elevated Ki-67.  She completed 4/6 planned neoadjuvant dose dense FEC with Neulasta support on day 2. 20% dose reduction on 5-FU, 50% dose reduction on epirubicin, 40% dose reduction on Cytoxan, after one-week delay due to anemia/thrombocytopenia/and region of probable localized thrombophlebitis versus mild  chemotherapy extravasation on the left forearm, covered with Keflex 500 mg by mouth 3 times a day.  Switched to single agent neoadjuvant Taxol, given 3 weeks on 1 week off, planned 4 cycles, due for week 1, cycle 4/4 today.  2. History of  severe neutropenia with associated fever, despite Neulasta, covered with Cipro 500mg  po BID, Augmentin 875mg  po BID, and 3 doses IV Rocephin 2.0gm on 4/10-4/12/13, now with normalization of WBC.  3. Area on left forearm suggesting possible chemotherapy extravasation versus localized thrombophlebitis.  4. Mildly symptomatic anemia, asymptomatic thrombocytopenia chemotherapy-induced.     Plan:  Orian will receive week 1 cycle 4/4 single agent Taxol given weekly in the neoadjuvant setting for 3 weeks on 1 week off today as scheduled.  She is doing well. He extravasation injury seems to have improved and is resolving. She has had her definitive genetic testing which was negative. She had an MRI scan at completion of chemotherapy. Surgery is planned for his 19th of September.  Pierce Crane, MD 06/28/12

## 2012-07-22 ENCOUNTER — Encounter (INDEPENDENT_AMBULATORY_CARE_PROVIDER_SITE_OTHER): Payer: PRIVATE HEALTH INSURANCE | Admitting: Surgery

## 2012-07-22 ENCOUNTER — Telehealth: Payer: Self-pay | Admitting: *Deleted

## 2012-07-22 NOTE — Telephone Encounter (Signed)
Per staff message and POF I have scheduled appts.  JMW  

## 2012-07-25 ENCOUNTER — Other Ambulatory Visit: Payer: Commercial Managed Care - PPO | Admitting: Lab

## 2012-07-25 ENCOUNTER — Ambulatory Visit: Payer: Commercial Managed Care - PPO

## 2012-07-25 ENCOUNTER — Ambulatory Visit: Payer: Commercial Managed Care - PPO | Admitting: Oncology

## 2012-07-25 ENCOUNTER — Other Ambulatory Visit: Payer: Self-pay | Admitting: *Deleted

## 2012-07-25 DIAGNOSIS — C50519 Malignant neoplasm of lower-outer quadrant of unspecified female breast: Secondary | ICD-10-CM

## 2012-07-26 ENCOUNTER — Encounter: Payer: Self-pay | Admitting: Family

## 2012-07-26 ENCOUNTER — Other Ambulatory Visit (HOSPITAL_BASED_OUTPATIENT_CLINIC_OR_DEPARTMENT_OTHER): Payer: Commercial Managed Care - PPO | Admitting: Lab

## 2012-07-26 ENCOUNTER — Ambulatory Visit (HOSPITAL_BASED_OUTPATIENT_CLINIC_OR_DEPARTMENT_OTHER): Payer: Commercial Managed Care - PPO

## 2012-07-26 ENCOUNTER — Ambulatory Visit (HOSPITAL_BASED_OUTPATIENT_CLINIC_OR_DEPARTMENT_OTHER): Payer: Commercial Managed Care - PPO | Admitting: Family

## 2012-07-26 VITALS — BP 110/76 | HR 96 | Temp 99.1°F | Resp 20 | Ht 68.0 in | Wt 162.8 lb

## 2012-07-26 DIAGNOSIS — C50519 Malignant neoplasm of lower-outer quadrant of unspecified female breast: Secondary | ICD-10-CM

## 2012-07-26 DIAGNOSIS — R5381 Other malaise: Secondary | ICD-10-CM

## 2012-07-26 DIAGNOSIS — Z5111 Encounter for antineoplastic chemotherapy: Secondary | ICD-10-CM

## 2012-07-26 DIAGNOSIS — C773 Secondary and unspecified malignant neoplasm of axilla and upper limb lymph nodes: Secondary | ICD-10-CM

## 2012-07-26 DIAGNOSIS — C50919 Malignant neoplasm of unspecified site of unspecified female breast: Secondary | ICD-10-CM

## 2012-07-26 DIAGNOSIS — T451X5A Adverse effect of antineoplastic and immunosuppressive drugs, initial encounter: Secondary | ICD-10-CM

## 2012-07-26 LAB — CBC WITH DIFFERENTIAL/PLATELET
Basophils Absolute: 0 10*3/uL (ref 0.0–0.1)
EOS%: 7.4 % — ABNORMAL HIGH (ref 0.0–7.0)
HCT: 35.5 % (ref 34.8–46.6)
HGB: 11.8 g/dL (ref 11.6–15.9)
MCH: 32.2 pg (ref 25.1–34.0)
MCV: 96.7 fL (ref 79.5–101.0)
MONO%: 7.4 % (ref 0.0–14.0)
NEUT%: 56.2 % (ref 38.4–76.8)
Platelets: 160 10*3/uL (ref 145–400)

## 2012-07-26 MED ORDER — FAMOTIDINE IN NACL 20-0.9 MG/50ML-% IV SOLN
20.0000 mg | Freq: Once | INTRAVENOUS | Status: AC
  Administered 2012-07-26: 20 mg via INTRAVENOUS

## 2012-07-26 MED ORDER — HEPARIN SOD (PORK) LOCK FLUSH 100 UNIT/ML IV SOLN
500.0000 [IU] | Freq: Once | INTRAVENOUS | Status: AC | PRN
  Administered 2012-07-26: 500 [IU]
  Filled 2012-07-26: qty 5

## 2012-07-26 MED ORDER — SODIUM CHLORIDE 0.9 % IJ SOLN
10.0000 mL | INTRAMUSCULAR | Status: DC | PRN
  Administered 2012-07-26: 10 mL
  Filled 2012-07-26: qty 10

## 2012-07-26 MED ORDER — ONDANSETRON 8 MG/50ML IVPB (CHCC)
8.0000 mg | Freq: Once | INTRAVENOUS | Status: AC
  Administered 2012-07-26: 8 mg via INTRAVENOUS

## 2012-07-26 MED ORDER — DIPHENHYDRAMINE HCL 50 MG/ML IJ SOLN
50.0000 mg | Freq: Once | INTRAMUSCULAR | Status: AC
  Administered 2012-07-26: 50 mg via INTRAVENOUS

## 2012-07-26 MED ORDER — DEXAMETHASONE SODIUM PHOSPHATE 10 MG/ML IJ SOLN
10.0000 mg | Freq: Once | INTRAMUSCULAR | Status: AC
  Administered 2012-07-26: 10 mg via INTRAVENOUS

## 2012-07-26 MED ORDER — SODIUM CHLORIDE 0.9 % IV SOLN
Freq: Once | INTRAVENOUS | Status: AC
  Administered 2012-07-26: 12:00:00 via INTRAVENOUS

## 2012-07-26 MED ORDER — PACLITAXEL CHEMO INJECTION 300 MG/50ML
80.0000 mg/m2 | Freq: Once | INTRAVENOUS | Status: AC
  Administered 2012-07-26: 150 mg via INTRAVENOUS
  Filled 2012-07-26: qty 25

## 2012-07-26 NOTE — Patient Instructions (Addendum)
[facility] Discharge Instructions for Patients Receiving Chemotherapy  Today you received the following chemotherapy agents Taxol.  To help prevent nausea and vomiting after your treatment, we encourage you to take your nausea medication as needed.    If you develop nausea and vomiting that is not controlled by your nausea medication, call the clinic. If it is after clinic hours your family physician or the after hours number for the clinic or go to the Emergency Department.   BELOW ARE SYMPTOMS THAT SHOULD BE REPORTED IMMEDIATELY:  *FEVER GREATER THAN 100.5 F  *CHILLS WITH OR WITHOUT FEVER  NAUSEA AND VOMITING THAT IS NOT CONTROLLED WITH YOUR NAUSEA MEDICATION  *UNUSUAL SHORTNESS OF BREATH  *UNUSUAL BRUISING OR BLEEDING  TENDERNESS IN MOUTH AND THROAT WITH OR WITHOUT PRESENCE OF ULCERS  *URINARY PROBLEMS  *BOWEL PROBLEMS  UNUSUAL RASH Items with * indicate a potential emergency and should be followed up as soon as possible.  One of the nurses will contact you 24 hours after your treatment. Please let the nurse know about any problems that you may have experienced. Feel free to call the clinic you have any questions or concerns. The clinic phone number is [phone].   I have been informed and understand all the instructions given to me. I know to contact the clinic, my physician, or go to the Emergency Department if any problems should occur. I do not have any questions at this time, but understand that I may call the clinic during office hours   should I have any questions or need assistance in obtaining follow up care.    __________________________________________  _____________  __________ Signature of Patient or Authorized Representative            Date                   Time    __________________________________________ Nurse's Signature

## 2012-07-26 NOTE — Progress Notes (Signed)
Hematology and Oncology Follow Up Visit  Sydney Cervantes [id number] 03-16-1961 51 y.o.    HPI: 51 year old [place] woman with: 1. 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 ER/PR positive at 63/5% respectively, HER-2 negative, elevated Ki-67.  Completed 4/6 planned neoadjuvant dose dense FEC with Neulasta support on day 2. 20% dose reduction on 5-FU, 50% dose reduction on epirubicin, 40% dose reduction on Cytoxan, after one-week delay due to anemia/thrombocytopenia/and region of probable localized thrombophlebitis versus mild chemotherapy extravasation on the left forearm, covered with Keflex 500 mg by mouth 3 times a day.  Switched to single agent neoadjuvant Taxol, given 3 weeks on 1 week off, planned 4 cycles, due for week 3, cycle 3/4  today. 2. History of severe neutropenia with associated fever, despite Neulasta, covered with Cipro 500mg  po BID, Augmentin 875mg  po BID, and 3 doses IV Rocephin 2.0gm on 4/10-4/12/13. 3. Area on left forearm suggesting possible chemotherapy extravasation versus localized thrombophlebitis. 4. Mild asymptomatic anemia, chemotherapy-induced. 5. BRCA negative.   Interim History:   Here accompanied by her husband for week 3 cycle 4/4 of neoadjuvant weekly Taxol. She is fatigued, denies unexplained fevers or chills. No headache or blurred vision. No cough or shortness of breath. No abdominal pain or new bone pain. Bowel and bladder function are normal. Appetite is good, with adequate fluid intake.  Denies neuropathy symptoms. No bleeding or bruising symptoms. Reports arthralgias and myalgias day 2-3 following chemotherapy. Looking forward to completing chemo and has questions regarding timing and duration of radiation. Remainder of the 10 point  review of systems is negative.   Medications:   I have reviewed the patient's current  medications.  Allergies:  Allergies  Allergen Reactions  . Adhesive (Tape) Itching and Rash    Rash and itching at steristrip and EKG lead pad sites   Physical Exam: Filed Vitals:   07/26/12 1133  BP: 110/76  Pulse: 96  Temp: 99.1 F (37.3 C)  Resp: 20    Body mass index is 24.75 kg/(m^2). Weight: 162 lbs. General: Well developed, well nourished, in no acute distress.  EENT: No ocular or oral lesions. No stomatitis.  Respiratory: Lungs are clear to auscultation bilaterally with normal respiratory movement and no accessory muscle use. Cardiac: No murmur, rub or tachycardia. No upper or lower extremity edema.  GI: Abdomen is soft, no palpable hepatosplenomegaly. No fluid wave. No tenderness. Musculoskeletal: No kyphosis, no tenderness over the spine, ribs or hips. Lymph: No cervical, infraclavicular, axillary or inguinal adenopathy. Neuro: No focal neurological deficits. Psych: Alert and oriented X 3, appropriate mood and affect.  Skin: Left forearm, a 2 cm x 1.5 cm indurated area with hyperpigmentation.  BREAST EXAM: In the supine position, with the right arm over the head, the right nipple is everted. No periareolar edema or nipple discharge. No mass in any quadrant or subareolar region. No redness of the skin. No right axillary adenopathy. With the left arm over the head, the left nipple is slightly flattened. No periareolar edema or nipple discharge. Middle third of the breast from 3 o'clock to 9 o'clock position, a firm tethered mass, approximately 6 cm. Mild redness of the skin.   Lab Results: Lab Results  Component Value Date   WBC 3.4* 07/26/2012   HGB 11.8 07/26/2012   HCT 35.5 07/26/2012   MCV 96.7 07/26/2012   PLT 160 07/26/2012   NEUTROABS 1.9 07/26/2012  Assessment:  1. Locally advanced, clinical stage T4, N3, infiltrating lobular carcinoma of the left breast with lymph node involvement and diffuse skin thickening, on neoadjuvant chemo with good tolerance.  2. Will  complete chemo 8/27 and proceed to surgery.   Plan:  1. Sydney Cervantes will receive week 3 cycle 4/4 single agent Taxol given weekly in the neoadjuvant setting for 3 weeks on 1 week off today as scheduled.  She knows to contact us in the interim if the need should arise. 2. Surgery with Sydney Cervantes 9/19. Left mastectomy.   3. Breast MRI 08/11/12.  4. Final cycle will be 08/02/12. She will see Sydney Cervantes with lab prior.  5. Will need radiation therapy following surgery.   Sydney Reels, FNP-C

## 2012-07-26 NOTE — Patient Instructions (Addendum)
1. Sydney Cervantes will receive week 3 cycle 4/4 single agent Taxol given weekly in the neoadjuvant setting for 3 weeks on 1 week off today as scheduled.  She knows to contact us in the interim if the need should arise. 2. Surgery with Dr. Ezzard Standing 9/19. Left mastectomy.   3. Breast MRI 08/11/12.  4. Final cycle will be 08/02/12. She will see Dr. Donnie Coffin with lab prior.  5. Will need radiation therapy following surgery.

## 2012-08-02 ENCOUNTER — Other Ambulatory Visit: Payer: Self-pay | Admitting: Family

## 2012-08-02 ENCOUNTER — Other Ambulatory Visit (HOSPITAL_BASED_OUTPATIENT_CLINIC_OR_DEPARTMENT_OTHER): Payer: Commercial Managed Care - PPO | Admitting: Lab

## 2012-08-02 ENCOUNTER — Ambulatory Visit (HOSPITAL_BASED_OUTPATIENT_CLINIC_OR_DEPARTMENT_OTHER): Payer: Commercial Managed Care - PPO

## 2012-08-02 ENCOUNTER — Ambulatory Visit (HOSPITAL_BASED_OUTPATIENT_CLINIC_OR_DEPARTMENT_OTHER): Payer: Commercial Managed Care - PPO | Admitting: Oncology

## 2012-08-02 VITALS — BP 112/80 | HR 105 | Temp 99.0°F | Resp 20 | Ht 68.0 in | Wt 160.7 lb

## 2012-08-02 DIAGNOSIS — C50919 Malignant neoplasm of unspecified site of unspecified female breast: Secondary | ICD-10-CM

## 2012-08-02 DIAGNOSIS — C50519 Malignant neoplasm of lower-outer quadrant of unspecified female breast: Secondary | ICD-10-CM

## 2012-08-02 DIAGNOSIS — Z5111 Encounter for antineoplastic chemotherapy: Secondary | ICD-10-CM

## 2012-08-02 DIAGNOSIS — C773 Secondary and unspecified malignant neoplasm of axilla and upper limb lymph nodes: Secondary | ICD-10-CM

## 2012-08-02 DIAGNOSIS — D6481 Anemia due to antineoplastic chemotherapy: Secondary | ICD-10-CM

## 2012-08-02 LAB — CBC & DIFF AND RETIC
Basophils Absolute: 0 10*3/uL (ref 0.0–0.1)
EOS%: 7.8 % — ABNORMAL HIGH (ref 0.0–7.0)
HGB: 12.1 g/dL (ref 11.6–15.9)
LYMPH%: 29.2 % (ref 14.0–49.7)
MCH: 32.6 pg (ref 25.1–34.0)
MCV: 94.9 fL (ref 79.5–101.0)
MONO%: 6.3 % (ref 0.0–14.0)
Platelets: 183 10*3/uL (ref 145–400)
RBC: 3.71 10*6/uL (ref 3.70–5.45)
RDW: 14.6 % — ABNORMAL HIGH (ref 11.2–14.5)
Retic %: 1.05 % (ref 0.70–2.10)

## 2012-08-02 MED ORDER — ONDANSETRON 8 MG/50ML IVPB (CHCC)
8.0000 mg | Freq: Once | INTRAVENOUS | Status: AC
  Administered 2012-08-02: 8 mg via INTRAVENOUS

## 2012-08-02 MED ORDER — PACLITAXEL CHEMO INJECTION 300 MG/50ML
80.0000 mg/m2 | Freq: Once | INTRAVENOUS | Status: AC
  Administered 2012-08-02: 150 mg via INTRAVENOUS
  Filled 2012-08-02: qty 25

## 2012-08-02 MED ORDER — SODIUM CHLORIDE 0.9 % IV SOLN
Freq: Once | INTRAVENOUS | Status: AC
  Administered 2012-08-02: 14:00:00 via INTRAVENOUS

## 2012-08-02 MED ORDER — DIPHENHYDRAMINE HCL 50 MG/ML IJ SOLN
50.0000 mg | Freq: Once | INTRAMUSCULAR | Status: AC
  Administered 2012-08-02: 50 mg via INTRAVENOUS

## 2012-08-02 MED ORDER — FAMOTIDINE IN NACL 20-0.9 MG/50ML-% IV SOLN
20.0000 mg | Freq: Once | INTRAVENOUS | Status: AC
  Administered 2012-08-02: 20 mg via INTRAVENOUS

## 2012-08-02 MED ORDER — SODIUM CHLORIDE 0.9 % IJ SOLN
10.0000 mL | INTRAMUSCULAR | Status: DC | PRN
  Administered 2012-08-02: 10 mL
  Filled 2012-08-02: qty 10

## 2012-08-02 MED ORDER — DEXAMETHASONE SODIUM PHOSPHATE 10 MG/ML IJ SOLN
10.0000 mg | Freq: Once | INTRAMUSCULAR | Status: AC
  Administered 2012-08-02: 10 mg via INTRAVENOUS

## 2012-08-02 MED ORDER — HEPARIN SOD (PORK) LOCK FLUSH 100 UNIT/ML IV SOLN
500.0000 [IU] | Freq: Once | INTRAVENOUS | Status: AC | PRN
  Administered 2012-08-02: 500 [IU]
  Filled 2012-08-02: qty 5

## 2012-08-02 NOTE — Patient Instructions (Signed)
[facility] Discharge Instructions for Patients Receiving Chemotherapy  Today you received the following chemotherapy agents Taxol  To help prevent nausea and vomiting after your treatment, we encourage you to take your nausea medication as needed   If you develop nausea and vomiting that is not controlled by your nausea medication, call the clinic. If it is after clinic hours your family physician or the after hours number for the clinic or go to the Emergency Department.   BELOW ARE SYMPTOMS THAT SHOULD BE REPORTED IMMEDIATELY:  *FEVER GREATER THAN 100.5 F  *CHILLS WITH OR WITHOUT FEVER  NAUSEA AND VOMITING THAT IS NOT CONTROLLED WITH YOUR NAUSEA MEDICATION  *UNUSUAL SHORTNESS OF BREATH  *UNUSUAL BRUISING OR BLEEDING  TENDERNESS IN MOUTH AND THROAT WITH OR WITHOUT PRESENCE OF ULCERS  *URINARY PROBLEMS  *BOWEL PROBLEMS  UNUSUAL RASH Items with * indicate a potential emergency and should be followed up as soon as possible.  The clinic phone number is [phone].   I have been informed and understand all the instructions given to me. I know to contact the clinic, my physician, or go to the Emergency Department if any problems should occur. I do not have any questions at this time, but understand that I may call the clinic during office hours   should I have any questions or need assistance in obtaining follow up care.    __________________________________________  _____________  __________ Signature of Patient or Authorized Representative            Date                   Time    __________________________________________ Nurse's Signature

## 2012-08-02 NOTE — Progress Notes (Signed)
Hematology and Oncology Follow Up Visit  ALYSEN SMYLIE [id number] [birth year] 51 y.o.    HPI: 51 year old [place] woman with: 1. 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 ER/PR positive at 63/5% respectively, HER-2 negative, elevated Ki-67.  Completed 4/6 planned neoadjuvant dose dense FEC with Neulasta support on day 2. 20% dose reduction on 5-FU, 50% dose reduction on epirubicin, 40% dose reduction on Cytoxan, after one-week delay due to anemia/thrombocytopenia/and region of probable localized thrombophlebitis versus mild chemotherapy extravasation on the left forearm, covered with Keflex 500 mg by mouth 3 times a day.  Switched to single agent neoadjuvant Taxol, given 3 weeks on 1 week off, planned 4 cycles, due for week 3, cycle 3/4  today. 2. History of severe neutropenia with associated fever, despite Neulasta, covered with Cipro 500mg  po BID, Augmentin 875mg  po BID, and 3 doses IV Rocephin 2.0gm on 4/10-4/12/13. 3. Area on left forearm suggesting possible chemotherapy extravasation versus localized thrombophlebitis. 4. Mild asymptomatic anemia, chemotherapy-induced. 5. BRCA negative.   Interim History:   Here accompanied by her husband for week 3 cycle 4/4 of neoadjuvant weekly Taxol. She is fatigued, denies unexplained fevers or chills. No headache or blurred vision. No cough or shortness of breath. No abdominal pain or new bone pain. Bowel and bladder function are normal. Appetite is good, with adequate fluid intake.  Denies neuropathy symptoms. No bleeding or bruising symptoms. Reports arthralgias and myalgias day 2-3 following chemotherapy. Looking forward to completing chemo and has questions regarding timing and duration of radiation. Remainder of the 10 point  review of systems is negative.   Medications:   I have reviewed the patient's current  medications.  Allergies:  Allergies  Allergen Reactions  . Adhesive (Tape) Itching and Rash    Rash and itching at steristrip and EKG lead pad sites   Physical Exam: Filed Vitals:   08/02/12 1227  BP: 112/80  Pulse: 105  Temp: 99 F (37.2 C)  Resp: 20    Body mass index is 24.43 kg/(m^2). Weight: 162 lbs. General: Well developed, well nourished, in no acute distress.  EENT: No ocular or oral lesions. No stomatitis.  Respiratory: Lungs are clear to auscultation bilaterally with normal respiratory movement and no accessory muscle use. Cardiac: No murmur, rub or tachycardia. No upper or lower extremity edema.  GI: Abdomen is soft, no palpable hepatosplenomegaly. No fluid wave. No tenderness. Musculoskeletal: No kyphosis, no tenderness over the spine, ribs or hips. Lymph: No cervical, infraclavicular, axillary or inguinal adenopathy. Neuro: No focal neurological deficits. Psych: Alert and oriented X 3, appropriate mood and affect.  Skin: Left forearm, a 2 cm x 1.5 cm indurated area with hyperpigmentation.  BREAST EXAM: In the supine position, with the right arm over the head, the right nipple is everted. No periareolar edema or nipple discharge. No mass in any quadrant or subareolar region. No redness of the skin. No right axillary adenopathy. With the left arm over the head, the left nipple is slightly flattened. No periareolar edema or nipple discharge. I am not sure a about the exact dimensions of the mass noted in the left breast though there is a vague palpable mass in the central portion of the breast. Lab Results: Lab Results  Component Value Date   WBC 3.2* 08/02/2012   HGB 12.1 08/02/2012   HCT 35.2 08/02/2012   MCV 94.9 08/02/2012   PLT 183 Large platelets present  08/02/2012   NEUTROABS 1.8 08/02/2012   Assessment:  1. Locally advanced, clinical stage T4, N3, infiltrating lobular carcinoma of the left breast with lymph node involvement and diffuse skin thickening, on  neoadjuvant chemo with good tolerance.  2. Will complete chemo 8/27 and proceed to surgery.   Plan:  1. Gabbi will receive week 3 cycle 4/4 single agent Taxol given weekly in the neoadjuvant setting for 3 weeks on 1 week off today as scheduled.  She knows to contact us in the interim if the need should arise. 2. Surgery with Dr. Ezzard Standing 9/19. Left mastectomy.   3. Breast MRI 08/11/12.    Marykathryn is doing well as noted. Her schedule as outlined. I will see her in October. I also have scheduled her for a PET scan as she did have contralateral activity seen in the supraclavicular fossa on the right side. I did be important to see this has resolved and may give [place] some clues as to the extent of her disease. And also the possibility of further consolidative radiation that location.   Felicha Frayne,

## 2012-08-03 ENCOUNTER — Telehealth: Payer: Self-pay | Admitting: Oncology

## 2012-08-03 NOTE — Telephone Encounter (Signed)
lmonvm for pt confirming 10/1 appt w/PR and 9/4 pet. appts already scheduled.

## 2012-08-10 ENCOUNTER — Encounter (HOSPITAL_COMMUNITY)
Admission: RE | Admit: 2012-08-10 | Discharge: 2012-08-10 | Disposition: A | Discharge status: Home / Self Care | Payer: Commercial Managed Care - PPO | Source: Ambulatory Visit | Attending: Oncology | Admitting: Oncology

## 2012-08-10 DIAGNOSIS — C50519 Malignant neoplasm of lower-outer quadrant of unspecified female breast: Secondary | ICD-10-CM | POA: Insufficient documentation

## 2012-08-10 LAB — GLUCOSE, CAPILLARY: Glucose-Capillary: 76 mg/dL (ref 70–99)

## 2012-08-10 MED ORDER — FLUDEOXYGLUCOSE F - 18 (FDG) INJECTION
16.6000 | Freq: Once | INTRAVENOUS | Status: AC | PRN
  Administered 2012-08-10: 16.6 via INTRAVENOUS

## 2012-08-11 ENCOUNTER — Ambulatory Visit
Admission: RE | Admit: 2012-08-11 | Discharge: 2012-08-11 | Disposition: A | Discharge status: Home / Self Care | Payer: Commercial Managed Care - PPO | Source: Ambulatory Visit | Attending: Surgery | Admitting: Surgery

## 2012-08-11 DIAGNOSIS — C50519 Malignant neoplasm of lower-outer quadrant of unspecified female breast: Secondary | ICD-10-CM

## 2012-08-11 MED ORDER — GADOBENATE DIMEGLUMINE 529 MG/ML IV SOLN
15.0000 mL | Freq: Once | INTRAVENOUS | Status: AC | PRN
  Administered 2012-08-11: 15 mL via INTRAVENOUS

## 2012-08-12 ENCOUNTER — Encounter (HOSPITAL_COMMUNITY): Payer: Self-pay | Admitting: Pharmacy Technician

## 2012-08-17 ENCOUNTER — Ambulatory Visit (INDEPENDENT_AMBULATORY_CARE_PROVIDER_SITE_OTHER): Payer: Commercial Managed Care - PPO | Admitting: Surgery

## 2012-08-17 ENCOUNTER — Encounter (INDEPENDENT_AMBULATORY_CARE_PROVIDER_SITE_OTHER): Payer: Self-pay | Admitting: Surgery

## 2012-08-17 VITALS — BP 128/70 | HR 68 | Temp 97.2°F | Resp 16 | Ht 68.0 in | Wt 160.0 lb

## 2012-08-17 DIAGNOSIS — C50519 Malignant neoplasm of lower-outer quadrant of unspecified female breast: Secondary | ICD-10-CM

## 2012-08-17 NOTE — Progress Notes (Addendum)
Re:   Sydney Cervantes DOB:   Aug 21, 1961 MRN:   [id number]  [facility]  ASSESSMENT AND PLAN: 1.  Left breast cancer.  T4, N3, Mx, at 11 o'clock.  [place] - 4.5 cm, MRI - 7.6 cm extending to pectoralis muscle.  Lobular, grade 3.  ER - 62%, PR - 5%, Her@Neu  - neg., Ki67 - 33  Left axillary node biopsy positive.  Treating oncology - [facility].  Finished chemotx - 08/02/2012  For left MRM 08/25/2012  I reviewed with the patient and her husband the indications and complications of modified radical mastectomy, including bleeding, infection, nerve injury and lymphedema.  I think she has a good handle on the surgery.  I filled out a prescription for a camisole for her mastectomy and gave her information on [facility].  [Final path - no residual ca in left breast, 7/8 nodes positive.  Discussed with patient.  08/29/2012]   2.  Level 1, 2 and 3 nodes positive - by MRI. 3.  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 3b. She has a "hot" right supraclavicular node on her original PET scan that is gone.  This area will be treated with Rad tx.  4.  Power port - 02/11/2012.  5.  Genetics - Her BRCA and BART were negative.   CancerNext panel (14 genes) was negative.  07/14/2012 6.  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 7.  Left forearm wound from extravasation.    REFERRING PHYSICIAN:  Dag Pavic, [facility].  HISTORY OF PRESENT ILLNESS: Sydney Cervantes is a 51 y.o. (DOB: Dec 08, 1960)  whtie female whose primary care physician is Debbora Dus, NP, [facility], and comes to me today for pre op visit for left breast cancert and planned left MRM on 08/25/2012.Marland Kitchen  She was getting neoadjuvant therapy and finished her chemotx 8/27.  Husband is with her.  We will plan her surgery 08/25/2012. I reviewed the planned operation and its potential complications.  She is in good spirits.  We will leave the port until the final path.  Her MRI 08/11/2012 showed continued decrease in left breast mass.  By PET  scan 08/10/2012, the lymph nodes are now gone.  I gave them copy of their reports.  Her mother had breast cancer in her 54's.  She then had a recurrence.  She is still living.      Past Medical History  Diagnosis Date  . Hx of colonoscopy 2000  . Wears glasses   . Breast cancer      Current Outpatient Prescriptions  Medication Sig Dispense Refill  . lidocaine-prilocaine (EMLA) cream Apply 1 application topically as needed. Prior to any blood draws or testing through port.         Allergies  Allergen Reactions  . Adhesive (Tape) Itching and Rash    Rash and itching at steristrip and EKG lead pad sites    REVIEW OF SYSTEMS:  Gastrointestinal: Lap chole - 2000's by Dr. Carolynne Edouard, Appendiceal carcinoid - 1987, last colonoscopy about 2000.  SOCIAL and FAMILY HISTORY: Married.  Husband, [name], with patient. They have 3 daughters - 22,19, 76. She works for [employer] and [employer] for [employer].  PHYSICAL EXAM: BP 128/70  Pulse 68  Temp 97.2 F (36.2 C) (Temporal)  Resp 16  Ht 5\' 8"  (1.727 m)  Wt 160 lb (72.576 kg)  BMI 24.33 kg/m2  LMP 01/08/2012  General: WN WF who is alert and generally healthy appearing.  HEENT:  Normal. Pupils equal. Good dentition. Neck: Supple. No mass.  No thyroid mass.   Lymph Nodes:  No supraclavicular, cervical, or axillary nodes.  I am not sure I can feel a positive note. Breasts:  Left:  Left breast - [place] at 5 o'clock position has just about gone. The left breast feels heavier and has some mild redness to the skin, but is overall much better.  Right - unremarkable.  DATA REVIEWED: Copies of PET scan and MRI to patient.  Ovidio Kin, MD,  [facility] [address] [place]  [address]   [place], [place]    [address] Phone:  [phone] FAX:  [phone]

## 2012-08-18 ENCOUNTER — Telehealth (INDEPENDENT_AMBULATORY_CARE_PROVIDER_SITE_OTHER): Payer: Self-pay | Admitting: General Surgery

## 2012-08-18 NOTE — Telephone Encounter (Signed)
PT IS SCHEDULED FOR SURGERY ON 08-25-12/ ERIKA FROM [facility] IS CALLING TO REQUEST ORDERS BE ENTERED INTO EPIC/ THANKS GY

## 2012-08-19 ENCOUNTER — Encounter (HOSPITAL_COMMUNITY): Payer: Self-pay

## 2012-08-19 ENCOUNTER — Encounter (HOSPITAL_COMMUNITY)
Admission: RE | Admit: 2012-08-19 | Discharge: 2012-08-19 | Disposition: A | Discharge status: Home / Self Care | Payer: Commercial Managed Care - PPO | Source: Ambulatory Visit | Attending: Surgery | Admitting: Surgery

## 2012-08-19 HISTORY — DX: Gastro-esophageal reflux disease without esophagitis: K21.9

## 2012-08-19 LAB — CBC
HCT: 36.4 % (ref 36.0–46.0)
MCHC: 34.1 g/dL (ref 30.0–36.0)
Platelets: 213 10*3/uL (ref 150–400)
RDW: 14.7 % (ref 11.5–15.5)
WBC: 4.1 10*3/uL (ref 4.0–10.5)

## 2012-08-19 LAB — BASIC METABOLIC PANEL
BUN: 10 mg/dL (ref 6–23)
Calcium: 9.5 mg/dL (ref 8.4–10.5)
Chloride: 106 mEq/L (ref 96–112)
Creatinine, Ser: 0.76 mg/dL (ref 0.50–1.10)
GFR calc Af Amer: >90 mL/min (ref >90)
GFR calc non Af Amer: >90 mL/min (ref >90)

## 2012-08-19 LAB — SURGICAL PCR SCREEN
MRSA, PCR: NEGATIVE
Staphylococcus aureus: NEGATIVE

## 2012-08-19 LAB — HCG, SERUM, QUALITATIVE: Preg, Serum: NEGATIVE

## 2012-08-19 NOTE — Pre-Procedure Instructions (Addendum)
20 HIYAB NHEM  08/19/2012   Your procedure is scheduled on:  08/25/12  Report to [facility] at 530 AM.  Call this number if you have problems the morning of surgery: [phone]   Remember:   Do not eat food or drink:After Midnight.    Take these medicines the morning of surgery with A SIP OF WATER:    Do not wear jewelry, make-up or nail polish.  Do not wear lotions, powders, or perfumes. You may wear deodorant.  Do not shave 48 hours prior to surgery. Men may shave face and neck.  Do not bring valuables to the hospital.  Contacts, dentures or bridgework may not be worn into surgery.  Leave suitcase in the car. After surgery it may be brought to your room.  For patients admitted to the hospital, checkout time is 11:00 AM the day of discharge.   Patients discharged the day of surgery will not be allowed to drive home.  Name and phone number of your driver: scott spouse [phone]  Special Instructions: CHG Shower Use Special Wash: 1/2 bottle night before surgery and 1/2 bottle morning of surgery.   Please read over the following fact sheets that you were given: Pain Booklet, Coughing and Deep Breathing, MRSA Information and Surgical Site Infection Prevention

## 2012-08-19 NOTE — Progress Notes (Signed)
cxr 4/13, echo 3/13

## 2012-08-22 IMAGING — CT NM PET TUM IMG INITIAL (PI) SKULL BASE T - THIGH
6 series · 25 of 25 positions shown · IV contrast ([ID])
Comparison: MRI breast 01/22/2012.

CLINICAL DATA: Initial treatment strategy for breast cancer..

NUCLEAR MEDICINE PET CT INITIAL (PI) SKULL BASE TO THIGH
TECHNIQUE: 16.6 mCi F-18 FDG was injected intravenously via the
right antecubital.  Full-ring PET imaging was performed from the
skull base through the mid-thighs 60  minutes after injection.  CT
data was obtained and used for attenuation correction and anatomic
localization only.  (This was not acquired as a diagnostic CT
examination.)
Fasting Blood Glucose:  96

[Series 1: pet ac · axial · 3.3mm · 4.69mm/px · z∈[-920,-50]mm · 5 of 267 slices shown]
[im 1/267]
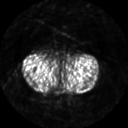
[im 67/267]
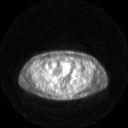
[im 134/267]
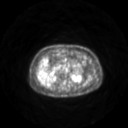
[im 200/267]
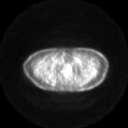
[im 267/267]
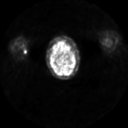

[Series 2: ct images · axial · 3.8mm · 0.98mm/px · z∈[-920,-50]mm · 6 of 267 slices shown]
[im 1/267]
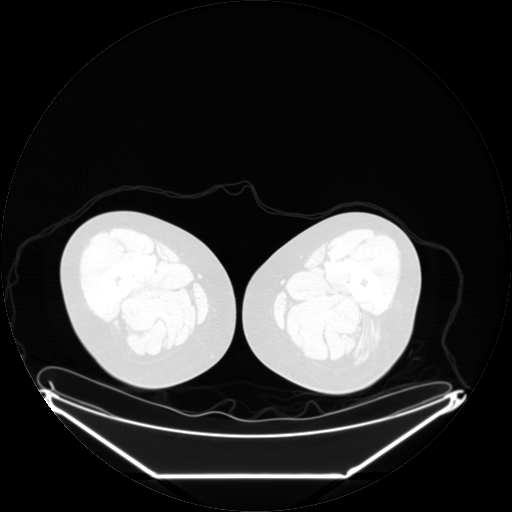
[im 54/267]
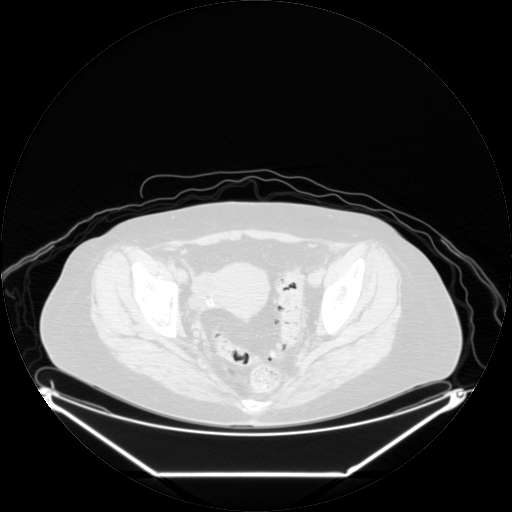
[im 107/267]
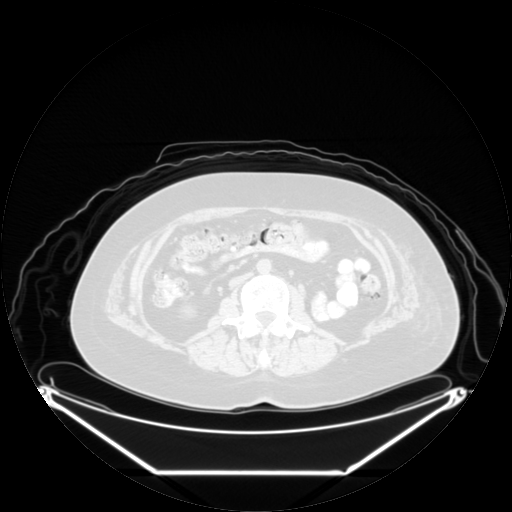
[im 160/267]
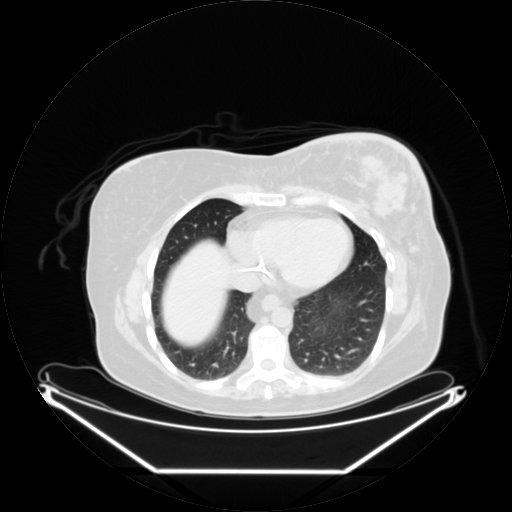
[im 213/267]
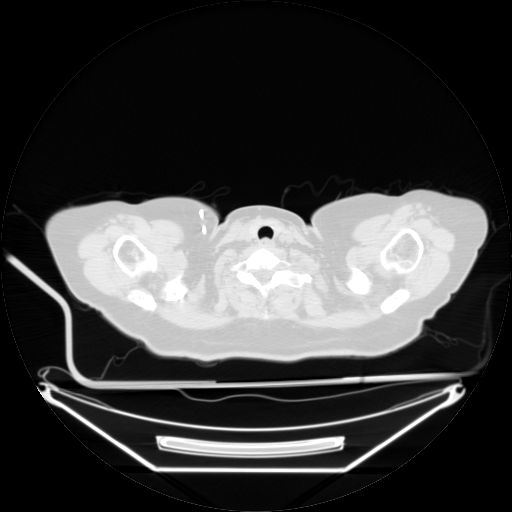
[im 267/267  brain]
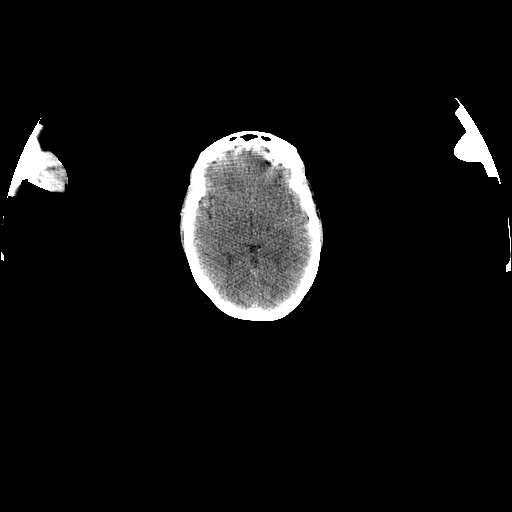

[Series 2: pet nac · axial · 3.3mm · 4.69mm/px · z∈[-920,-50]mm · 6 of 267 slices shown]
[im 1/267]
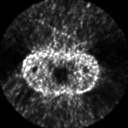
[im 54/267]
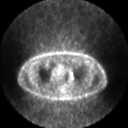
[im 107/267]
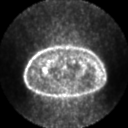
[im 160/267]
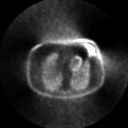
[im 213/267]
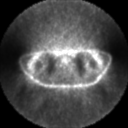
[im 267/267]
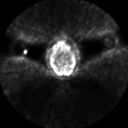

[Series 123: mip · coronal · 3.3mm · 4.69mm/px · 1 of 30 slices shown]
[im 1/30]
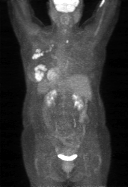

[Series 151: reformatted · axial · 3.3mm · 3.91mm/px · z∈[-920,-50]mm · 6 of 267 slices shown (1 of 2)]
[im 1/267]
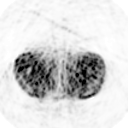
[im 54/267]
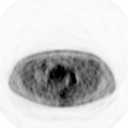
[im 107/267]
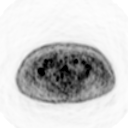
[im 160/267]
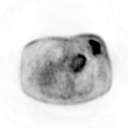
[im 213/267]
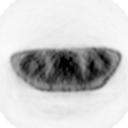
[im 267/267]
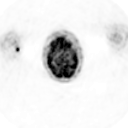

[Series 153: reformatted · coronal · 4.7mm · 6.98mm/px · 1 of 68 slices shown (2 of 2)]
[im 1/68]
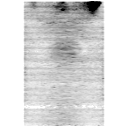

[25 of 25 positions shown; findings below may reference images not displayed]

FINDINGS: The large left breast masses markedly hypermetabolic with
diffuse increased FDG uptake and SUV max of 16.3.  There are also
enlarged hypermetabolic axillary and subpectoral lymph nodes.
There is a hypermetabolic left internal mammary lymph node and a
metastatic supraclavicular lymph node on the right side at the
level of the first anterior rib.  Mild diffuse skin thickening
noted over the left breast and diffuse hazy increased FDG activity
suggesting lymphatic spread.

No findings for pulmonary metastatic disease, mediastinal or hilar
lymphadenopathy.  No metastatic disease is identified in the
abdomen or pelvis.  No findings for metastatic bone disease.

No significant CT findings.  There is a simple appearing cyst
associated with the left ovary.  Surgical changes are noted from a
cholecystectomy and anterior abdominal wall surgery.
IMPRESSION: 1.  Large left hypermetabolic breast mass consistent with known
breast cancer.
2.  FDG positive left axillary, left subpectoral, left internal
mammary and right supraclavicular lymphadenopathy.
3.  No findings for metastatic disease involving the lungs,
abdomen, pelvis or osseous structures.

## 2012-08-23 ENCOUNTER — Encounter: Payer: Self-pay | Admitting: Oncology

## 2012-08-23 NOTE — Progress Notes (Signed)
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  She will try and bring info tomorrow.

## 2012-08-25 ENCOUNTER — Encounter (HOSPITAL_COMMUNITY): Payer: Self-pay | Admitting: Anesthesiology

## 2012-08-25 ENCOUNTER — Encounter (HOSPITAL_COMMUNITY)
Admission: RE | Disposition: A | Discharge status: Home / Self Care | Payer: Self-pay | Source: Ambulatory Visit | Attending: Surgery

## 2012-08-25 ENCOUNTER — Ambulatory Visit (HOSPITAL_COMMUNITY)
Admission: RE | Admit: 2012-08-25 | Discharge: 2012-08-26 | Disposition: A | Discharge status: Home / Self Care | Payer: Commercial Managed Care - PPO | Source: Ambulatory Visit | Attending: Surgery | Admitting: Surgery

## 2012-08-25 ENCOUNTER — Ambulatory Visit (HOSPITAL_COMMUNITY): Payer: Commercial Managed Care - PPO | Admitting: Anesthesiology

## 2012-08-25 ENCOUNTER — Encounter (HOSPITAL_COMMUNITY): Payer: Self-pay | Admitting: *Deleted

## 2012-08-25 DIAGNOSIS — Z01812 Encounter for preprocedural laboratory examination: Secondary | ICD-10-CM | POA: Insufficient documentation

## 2012-08-25 DIAGNOSIS — C50919 Malignant neoplasm of unspecified site of unspecified female breast: Secondary | ICD-10-CM

## 2012-08-25 DIAGNOSIS — K219 Gastro-esophageal reflux disease without esophagitis: Secondary | ICD-10-CM | POA: Insufficient documentation

## 2012-08-25 DIAGNOSIS — C50519 Malignant neoplasm of lower-outer quadrant of unspecified female breast: Secondary | ICD-10-CM | POA: Insufficient documentation

## 2012-08-25 HISTORY — DX: Anemia, unspecified: D64.9

## 2012-08-25 HISTORY — PX: MASTECTOMY MODIFIED RADICAL: SUR848

## 2012-08-25 HISTORY — DX: Other seasonal allergic rhinitis: J30.2

## 2012-08-25 LAB — CREATININE, SERUM: GFR calc Af Amer: >90 mL/min (ref >90)

## 2012-08-25 SURGERY — MASTECTOMY, MODIFIED RADICAL
Anesthesia: General | Site: Breast | Laterality: Left | Wound class: Clean

## 2012-08-25 MED ORDER — ACETAMINOPHEN 325 MG PO TABS
650.0000 mg | ORAL_TABLET | Freq: Four times a day (QID) | ORAL | Status: DC | PRN
Start: 2012-08-25 — End: 2012-08-26
  Administered 2012-08-25: 650 mg via ORAL
  Filled 2012-08-25: qty 2

## 2012-08-25 MED ORDER — 0.9 % SODIUM CHLORIDE (POUR BTL) OPTIME
TOPICAL | Status: DC | PRN
  Administered 2012-08-25 (×2): 1000 mL

## 2012-08-25 MED ORDER — ONDANSETRON HCL 4 MG/2ML IJ SOLN: 4.0000 mg | Freq: Four times a day (QID) | INTRAMUSCULAR | Status: DC | PRN

## 2012-08-25 MED ORDER — ONDANSETRON HCL 4 MG/2ML IJ SOLN
INTRAMUSCULAR | Status: DC | PRN
  Administered 2012-08-25: 4 mg via INTRAVENOUS

## 2012-08-25 MED ORDER — HEPARIN SOD (PORK) LOCK FLUSH 100 UNIT/ML IV SOLN
INTRAVENOUS | Status: DC | PRN
  Administered 2012-08-25: 500 [IU] via INTRAVENOUS

## 2012-08-25 MED ORDER — CEFAZOLIN SODIUM-DEXTROSE 2-3 GM-% IV SOLR
INTRAVENOUS | Status: DC | PRN
  Administered 2012-08-25: 2 g via INTRAVENOUS

## 2012-08-25 MED ORDER — LACTATED RINGERS IV SOLN
INTRAVENOUS | Status: DC | PRN
  Administered 2012-08-25: 07:00:00 via INTRAVENOUS

## 2012-08-25 MED ORDER — ONDANSETRON HCL 4 MG PO TABS: 4.0000 mg | ORAL_TABLET | Freq: Four times a day (QID) | ORAL | Status: DC | PRN

## 2012-08-25 MED ORDER — HEPARIN SOD (PORK) LOCK FLUSH 100 UNIT/ML IV SOLN
INTRAVENOUS | Status: AC
  Filled 2012-08-25: qty 5

## 2012-08-25 MED ORDER — KCL IN DEXTROSE-NACL 20-5-0.45 MEQ/L-%-% IV SOLN
INTRAVENOUS | Status: AC
  Administered 2012-08-25: 1000 mL via INTRAVENOUS
  Filled 2012-08-25: qty 1000

## 2012-08-25 MED ORDER — MIDAZOLAM HCL 5 MG/5ML IJ SOLN
INTRAMUSCULAR | Status: DC | PRN
  Administered 2012-08-25 (×2): 1 mg via INTRAVENOUS

## 2012-08-25 MED ORDER — KCL IN DEXTROSE-NACL 20-5-0.45 MEQ/L-%-% IV SOLN
INTRAVENOUS | Status: DC
  Administered 2012-08-25: 1000 mL via INTRAVENOUS
  Administered 2012-08-25: 22:00:00 via INTRAVENOUS
  Administered 2012-08-25: 1000 mL via INTRAVENOUS
  Filled 2012-08-25 (×4): qty 1000

## 2012-08-25 MED ORDER — HYDROMORPHONE HCL PF 1 MG/ML IJ SOLN
0.2500 mg | INTRAMUSCULAR | Status: DC | PRN
  Administered 2012-08-25 (×2): 0.5 mg via INTRAVENOUS

## 2012-08-25 MED ORDER — LIDOCAINE HCL (CARDIAC) 20 MG/ML IV SOLN
INTRAVENOUS | Status: DC | PRN
  Administered 2012-08-25: 60 mg via INTRAVENOUS

## 2012-08-25 MED ORDER — MORPHINE SULFATE 2 MG/ML IJ SOLN: 1.0000 mg | INTRAMUSCULAR | Status: DC | PRN

## 2012-08-25 MED ORDER — PROPOFOL 10 MG/ML IV BOLUS
INTRAVENOUS | Status: DC | PRN
  Administered 2012-08-25: 150 mg via INTRAVENOUS

## 2012-08-25 MED ORDER — HYDROCODONE-ACETAMINOPHEN 5-325 MG PO TABS: 1.0000 | ORAL_TABLET | ORAL | Status: DC | PRN

## 2012-08-25 MED ORDER — CEFAZOLIN SODIUM-DEXTROSE 2-3 GM-% IV SOLR
INTRAVENOUS | Status: AC
  Filled 2012-08-25: qty 50

## 2012-08-25 MED ORDER — HEPARIN SODIUM (PORCINE) 5000 UNIT/ML IJ SOLN
5000.0000 [IU] | Freq: Three times a day (TID) | INTRAMUSCULAR | Status: DC
  Administered 2012-08-25 – 2012-08-26 (×2): 5000 [IU] via SUBCUTANEOUS
  Filled 2012-08-25 (×6): qty 1

## 2012-08-25 MED ORDER — METHYLENE BLUE 1 % INJ SOLN
INTRAMUSCULAR | Status: AC
  Filled 2012-08-25: qty 10

## 2012-08-25 MED ORDER — HYDROMORPHONE HCL PF 1 MG/ML IJ SOLN
INTRAMUSCULAR | Status: AC
  Filled 2012-08-25: qty 1

## 2012-08-25 MED ORDER — FENTANYL CITRATE 0.05 MG/ML IJ SOLN
INTRAMUSCULAR | Status: DC | PRN
  Administered 2012-08-25: 50 ug via INTRAVENOUS
  Administered 2012-08-25: 75 ug via INTRAVENOUS
  Administered 2012-08-25 (×3): 25 ug via INTRAVENOUS
  Administered 2012-08-25: 50 ug via INTRAVENOUS

## 2012-08-25 SURGICAL SUPPLY — 58 items
APPLIER CLIP 9.375 MED OPEN (MISCELLANEOUS) ×2
ATCH SMKEVC FLXB CAUT HNDSWH (FILTER) ×1 IMPLANT
BANDAGE ELASTIC 6 VELCRO ST LF (GAUZE/BANDAGES/DRESSINGS) ×2 IMPLANT
BENZOIN TINCTURE PRP APPL 2/3 (GAUZE/BANDAGES/DRESSINGS) ×2 IMPLANT
BINDER BREAST LRG (GAUZE/BANDAGES/DRESSINGS) ×2 IMPLANT
BINDER BREAST XLRG (GAUZE/BANDAGES/DRESSINGS) IMPLANT
BIOPATCH RED 1 DISK 7.0 (GAUZE/BANDAGES/DRESSINGS) ×4 IMPLANT
CANISTER SUCTION 2500CC (MISCELLANEOUS) ×2 IMPLANT
CLIP APPLIE 9.375 MED OPEN (MISCELLANEOUS) ×1 IMPLANT
CLOTH BEACON ORANGE TIMEOUT ST (SAFETY) ×2 IMPLANT
CONT SPEC 4OZ CLIKSEAL STRL BL (MISCELLANEOUS) IMPLANT
COVER PROBE W GEL 5X96 (DRAPES) ×2 IMPLANT
COVER SURGICAL LIGHT HANDLE (MISCELLANEOUS) ×2 IMPLANT
DRAIN CHANNEL 19F RND (DRAIN) ×4 IMPLANT
DRAPE LAPAROSCOPIC ABDOMINAL (DRAPES) ×2 IMPLANT
DRAPE PROXIMA HALF (DRAPES) ×2 IMPLANT
DRAPE UTILITY 15X26 W/TAPE STR (DRAPE) ×4 IMPLANT
DRSG PAD ABDOMINAL 8X10 ST (GAUZE/BANDAGES/DRESSINGS) ×2 IMPLANT
ELECT CAUTERY BLADE 6.4 (BLADE) ×2 IMPLANT
ELECT REM PT RETURN 9FT ADLT (ELECTROSURGICAL) ×2
ELECTRODE REM PT RTRN 9FT ADLT (ELECTROSURGICAL) ×1 IMPLANT
EVACUATOR SILICONE 100CC (DRAIN) ×4 IMPLANT
EVACUATOR SMOKE ACCUVAC VALLEY (FILTER) ×1
GLOVE BIO SURGEON STRL SZ7.5 (GLOVE) ×2 IMPLANT
GLOVE BIOGEL PI IND STRL 6 (GLOVE) ×1 IMPLANT
GLOVE BIOGEL PI IND STRL 7.0 (GLOVE) ×3 IMPLANT
GLOVE BIOGEL PI INDICATOR 6 (GLOVE) ×1
GLOVE BIOGEL PI INDICATOR 7.0 (GLOVE) ×3
GLOVE ECLIPSE 6.5 STRL STRAW (GLOVE) ×2 IMPLANT
GLOVE SURG SIGNA 7.5 PF LTX (GLOVE) ×4 IMPLANT
GLOVE SURG SS PI 7.0 STRL IVOR (GLOVE) ×2 IMPLANT
GOWN STRL NON-REIN LRG LVL3 (GOWN DISPOSABLE) ×6 IMPLANT
GOWN STRL REIN XL XLG (GOWN DISPOSABLE) ×4 IMPLANT
IV KIT POWERLOC 20X1 SAFE (NEEDLE) ×2 IMPLANT
KIT BASIN OR (CUSTOM PROCEDURE TRAY) ×2 IMPLANT
KIT ROOM TURNOVER OR (KITS) ×2 IMPLANT
NEEDLE 18GX1X1/2 (RX/OR ONLY) (NEEDLE) ×2 IMPLANT
NEEDLE HYPO 25GX1X1/2 BEV (NEEDLE) ×2 IMPLANT
NS IRRIG 1000ML POUR BTL (IV SOLUTION) ×4 IMPLANT
PACK GENERAL/GYN (CUSTOM PROCEDURE TRAY) ×2 IMPLANT
PAD ARMBOARD 7.5X6 YLW CONV (MISCELLANEOUS) ×4 IMPLANT
PREFILTER EVAC NS 1 1/3-3/8IN (MISCELLANEOUS) ×2 IMPLANT
SPECIMEN JAR LARGE (MISCELLANEOUS) ×2 IMPLANT
SPECIMEN JAR X LARGE (MISCELLANEOUS) IMPLANT
SPONGE GAUZE 4X4 12PLY (GAUZE/BANDAGES/DRESSINGS) ×2 IMPLANT
SPONGE LAP 18X18 X RAY DECT (DISPOSABLE) ×4 IMPLANT
STAPLER VISISTAT 35W (STAPLE) ×2 IMPLANT
STRIP CLOSURE SKIN 1/4X4 (GAUZE/BANDAGES/DRESSINGS) ×2 IMPLANT
SUT ETHILON 2 0 FS 18 (SUTURE) ×4 IMPLANT
SUT MON AB 5-0 PS2 18 (SUTURE) ×2 IMPLANT
SUT SILK 3 0 (SUTURE) ×1
SUT SILK 3-0 18XBRD TIE 12 (SUTURE) ×1 IMPLANT
SUT VIC AB 3-0 SH 18 (SUTURE) ×4 IMPLANT
SYR CONTROL 10ML LL (SYRINGE) ×2 IMPLANT
TOWEL OR 17X24 6PK STRL BLUE (TOWEL DISPOSABLE) ×2 IMPLANT
TOWEL OR 17X26 10 PK STRL BLUE (TOWEL DISPOSABLE) ×2 IMPLANT
TUBE CONNECTING 12X1/4 (SUCTIONS) ×2 IMPLANT
WATER STERILE IRR 1000ML POUR (IV SOLUTION) IMPLANT

## 2012-08-25 NOTE — Progress Notes (Signed)
Report given to shelly rn as caregiver 

## 2012-08-25 NOTE — Brief Op Note (Signed)
08/25/2012  10:09 AM  PATIENT:  Sydney Cervantes, 51 y.o., female, MRN: [id number]  PREOP DIAGNOSIS:  left breast cancer  POSTOP DIAGNOSIS:   Left breast cancer (T4,N3), post neoadjuvant chemotx  PROCEDURE:   Procedure(s):  Left MASTECTOMY,  MODIFIED RADICAL, Excision of inferior flap skin, flush right subclavian power port  SURGEON:   Ovidio Kin, M.D.  ASSISTANT:   None  ANESTHESIA:   general  Lesle Chris. Hypes, RN - CRNA Raiford Simmonds, MD - Anesthesiologist Adria Dill, CRNA - CRNA Lesle Chris. Hypes, RN - CRNA  General  EBL:  150  ml  BLOOD ADMINISTERED: none  DRAINS: 2 - 19 French Blake drains  LOCAL MEDICATIONS USED:   None  SPECIMEN:   Left breast/axilla, highest axillary contents, inferior flap skin  COUNTS CORRECT:  YES  INDICATIONS FOR PROCEDURE:  AMIL MOSEMAN is a 51 y.o. (DOB: [birth year]) white female whose primary care physician is Pcp Not In System and comes for left modified radical mastectomy   The indications and risks of the surgery were explained to the patient.  The risks include, but are not limited to, infection, bleeding, and nerve injury.  Note dictated to:   [phone]

## 2012-08-25 NOTE — Op Note (Signed)
Sydney Cervantes, Sydney Cervantes                ACCOUNT NO.:  [id number]  MEDICAL RECORD NO.:  [id number]  LOCATION:  [facility]                         FACILITY:  [facility]  PHYSICIAN:  Sandria Bales. Ezzard Standing, M.D.  DATE OF BIRTH:  [birth year]  DATE OF PROCEDURE: 08/25/2012                               OPERATIVE REPORT  PREOPERATIVE DIAGNOSIS:  Left breast cancer, status post neoadjuvant chemotherapy.  POSTOPERATIVE DIAGNOSIS:  Left breast cancer (initially T4, N3), status post neoadjuvant chemotherapy.  PROCEDURES:  Left modified radical mastectomy, excision of inferior skin flap, flush of right subclavian Port-A-Cath with heparinized saline.  SURGEON:  Sandria Bales. Ezzard Standing, MD  FIRST ASSISTANT:  None.  ANESTHESIA:  General.  LMA supervised Dr. Achille Rich.  ESTIMATED BLOOD LOSS:  150 mL.  DRAINS:  Left in were 2, 19-French Blake drains.  The local anesthetic was none.  INDICATION FOR PROCEDURE:  Sydney Cervantes presented [facility] with an advanced left breast cancer.  She sees Dr. Debbora Dus as her primary care doctor.  She saw Dr. Pierce Crane from Oncology standpoint and Dr. Lurline Hare from radiation oncology. Her original tumor, by MRI, was about 7.6 cm extending to the pectoralis muscle. There is evidence of involvement of level 1, 2 and 3 axillary lymph nodes.  Therefore, she underwent a neoadjuvant chemotherapy by Dr. Pierce Crane and had a good response with still mass effect in the left breast, but by MRI resolution of her lymphadenopathy.  She now comes for left modified radical mastectomy.  Indications and potential complications were explained to the patient.  The patient potential complications include, but are not limited to, bleeding, infection, nerve injury, and lymphedema.  Also there will be risks of recurrence of her cancer on that side.  OPERATIVE NOTE:  The patient was taken to operating room 2 at [facility] where she underwent a general anesthesia  supervised by Dr. Achille Rich.  She was given 2 g of Ancef initially during the procedure. Her left breast and axilla were prepped with ChloraPrep and sterilely draped.  A time-out was held and surgical checklist run.  I made an elliptical incision including the areola/nipple of the left breast.  Of note, she still has dense feeling of left breast compared to the right breast and she has thickened skin and some evidence of edema of her left breast.  However there is no palpable nodule or mass noted.  I developed a skin flap superiorly to about 2 fingers below the clavicle, medially to the edge of the sternum, inferiorly to the investing fascia of the rectus abdominal muscle and laterally to latissimus dorsi muscle.  I reflected the breast off the chest wall and got to the axilla.  The breast tissue was stickier towards the lower breast flap and some on the pectoralis major muscle.  But I saw no obvious tumor during my dissection.  I continued my mastectomy dissection to the left axilla.  Her axilla was more fibrous than normal but had no gross disease.  I identified the left axillary vein.  I stripped the axillary contents off the vein inferiorly.  I took at least 1 tributary off the left axillary vein.  I was able to spare 1 tributary from the axillary vein.  I got up under the pectoralis minor muscle to take level 2 nodes.  I took a separate specimen of fat medial to this, at the junction of level 2 and level 3 nodes.  There was no palpable adenopathy in this fat pad, but is about 2-3 cm fat mass that I sent of as a separate specimen.  I labeled this "highest axillary dissection".  I spared the thoracodorsal nerves and the long thoracic nerve of Bell during the dissection.  I did excise some of the inferior skin flap.  The skin of the inferior flap looked more thick than the superior flap.  I then irrigated the breast wound with 2 L of saline.  I brought out two 19-French Blake drains  through inferior stab wounds and sewed these in place with 2-0 nylon suture.  I then closed the subcutaneous tissues with 3-0 Vicryl suture, the skin with a 5-0 Monocryl suture, painted the wounds with tincture of benzoin and Steri-Strip the wound.  The patient tolerated the procedure well.  Sponge and needle count were correct at the end of the case.  Estimated blood loss about 150 mL. Final pathology was pending.  There were 3 specimens: the left breast and left axilla, the inferior skin flap (which had a long suture cranial and a short suture lateral), and the highest axillary node (fat pad) at the junction of level 2 to level 3 nodes.   Sandria Bales. Ezzard Standing, M.D., FACS   DHN/MEDQ  D:  08/25/2012  T:  08/25/2012  Job:  [id number]  cc:   Debbora Dus, NP Pierce Crane, M.D., F.R.C.P.C. Lurline Hare, M.D.

## 2012-08-25 NOTE — Transfer of Care (Signed)
Immediate Anesthesia Transfer of Care Note  Patient: Sydney Cervantes  Procedure(s) Performed: Procedure(s) (LRB) with comments: MASTECTOMY MODIFIED RADICAL (Left)  Patient Location: PACU  Anesthesia Type: General  Level of Consciousness: awake, alert , oriented and patient cooperative  Airway & Oxygen Therapy: Patient Spontanous Breathing and Patient connected to nasal cannula oxygen  Post-op Assessment: Report given to PACU RN, Post -op Vital signs reviewed and stable  Post vital signs: Reviewed and stable  Complications: No apparent anesthesia complications

## 2012-08-25 NOTE — Anesthesia Preprocedure Evaluation (Signed)
Anesthesia Evaluation  Patient identified by MRN, date of birth, ID band Patient awake    Reviewed: Allergy & Precautions, H&P , NPO status , Patient's Chart, lab work & pertinent test results  Airway Mallampati: II  Neck ROM: full    Dental   Pulmonary          Cardiovascular     Neuro/Psych    GI/Hepatic GERD-  ,  Endo/Other    Renal/GU      Musculoskeletal   Abdominal   Peds  Hematology   Anesthesia Other Findings   Reproductive/Obstetrics Breast CA                           Anesthesia Physical Anesthesia Plan  ASA: II  Anesthesia Plan: General   Post-op Pain Management:    Induction: Intravenous  Airway Management Planned: Oral ETT  Additional Equipment:   Intra-op Plan:   Post-operative Plan: Extubation in OR  Informed Consent: I have reviewed the patients History and Physical, chart, labs and discussed the procedure including the risks, benefits and alternatives for the proposed anesthesia with the patient or authorized representative who has indicated his/her understanding and acceptance.     Plan Discussed with: CRNA and Surgeon  Anesthesia Plan Comments:         Anesthesia Quick Evaluation

## 2012-08-25 NOTE — Interval H&P Note (Signed)
History and Physical Interval Note:  08/25/2012 7:25 AM  Sydney Cervantes  has presented today for surgery, with the diagnosis of left breast cancer  The various methods of treatment have been discussed with the patient and family. Her husband is here.  After consideration of risks, benefits and other options for treatment, the patient has consented to  Procedure(s) (LRB) with comments: MASTECTOMY MODIFIED RADICAL (Left) as a surgical intervention .    The patient's history has been reviewed, patient examined, no change in status, stable for surgery.  I have reviewed the patient's chart and labs.  Questions were answered to the patient's satisfaction.     Calel Pisarski H

## 2012-08-25 NOTE — Anesthesia Procedure Notes (Signed)
Procedure Name: LMA Insertion Date/Time: 08/25/2012 7:40 AM Performed by: Gayla Medicus Pre-anesthesia Checklist: Patient identified, Emergency Drugs available, Suction available, Patient being monitored and Timeout performed Patient Re-evaluated:Patient Re-evaluated prior to inductionOxygen Delivery Method: Circle system utilized Preoxygenation: Pre-oxygenation with 100% oxygen Intubation Type: IV induction LMA: LMA inserted LMA Size: 4.0 Number of attempts: 1 Placement Confirmation: positive ETCO2 and breath sounds checked- equal and bilateral Tube secured with: Tape Dental Injury: Teeth and Oropharynx as per pre-operative assessment

## 2012-08-25 NOTE — H&P (View-Only) (Signed)
Re:   [name] DOB:   01-Oct-1961 MRN:   [id number]  [facility]  ASSESSMENT AND PLAN: 1.  Left breast cancer.  T4, N3, Mx, at 11 o'clock.  [place] - 4.5 cm, MRI - 7.6 cm extending to pectoralis muscle.  Lobular, grade 3.  ER - 62%, PR - 5%, Her@Neu  - neg., Ki67 - 33  Left axillary node biopsy positive.  Treating oncology - Rubin/Wentworth.  Finished chemotx - 08/02/2012  For left MRM 08/25/2012  I reviewed with the patient and her husband the indications and complications of modified radical mastectomy, including bleeding, infection, nerve injury and lymphedema.  I think she has a good handle on the surgery.  I filled out a prescription for a camisole for her mastectomy and gave her information on [facility] [place].   2.  Level 1, 2 and 3 nodes positive - by MRI. 3.  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 3b. She has a "hot" right supraclavicular node on her original PET scan that is gone.  This area will be treated with Rad tx.  4.  Power port - 02/11/2012.  5.  Genetics - Her BRCA and BART were negative.   CancerNext panel (14 genes) was negative.  07/14/2012 6.  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 7.  Left forearm wound from extravasation.    REFERRING PHYSICIAN:  Dag Pavic, [facility].  HISTORY OF PRESENT ILLNESS: Sydney Cervantes is a 51 y.o. (DOB: [birth year])  whtie female whose primary care physician is Sydney Dus, NP, [facility], and comes to me today for pre op visit for left breast cancert and planned left MRM on 08/25/2012.Marland Kitchen  She was getting neoadjuvant therapy and finished her chemotx 8/27.  Husband is with her.  We will plan her surgery 08/25/2012. I reviewed the planned operation and its potential complications.  She is in good spirits.  We will leave the port until the final path.  Her MRI 08/11/2012 showed continued decrease in left breast mass.  By PET scan 08/10/2012, the lymph nodes are now gone.  I gave them copy of their reports.  Her mother had  breast cancer in her 51's.  She then had a recurrence.  She is still living.      Past Medical History  Diagnosis Date  . Hx of colonoscopy 2000  . Wears glasses   . Breast cancer      Current Outpatient Prescriptions  Medication Sig Dispense Refill  . lidocaine-prilocaine (EMLA) cream Apply 1 application topically as needed. Prior to any blood draws or testing through port.         Allergies  Allergen Reactions  . Adhesive (Tape) Itching and Rash    Rash and itching at steristrip and EKG lead pad sites    REVIEW OF SYSTEMS:  Gastrointestinal: Lap chole - 2000's by Dr. Carolynne Edouard, Appendiceal carcinoid - 1987, last colonoscopy about 2000.  SOCIAL and FAMILY HISTORY: Married.  Husband, [name], with patient. They have 3 daughters - 22,19, 20. She works for [employer] and [employer] for [employer].  PHYSICAL EXAM: BP 128/70  Pulse 68  Temp 97.2 F (36.2 C) (Temporal)  Resp 16  Ht 5\' 8"  (1.727 m)  Wt 160 lb (72.576 kg)  BMI 24.33 kg/m2  LMP 01/08/2012  General: WN WF who is alert and generally healthy appearing.  HEENT: Normal. Pupils equal. Good dentition. Neck: Supple. No mass.  No thyroid mass.   Lymph Nodes:  No  supraclavicular, cervical, or axillary nodes.  I am not sure I can feel a positive note. Breasts:  Left:  Left breast - [place]pling at 5 o'clock position has just about gone. The left breast feels heavier and has some mild redness to the skin, but is overall much better.  Right - unremarkable.  DATA REVIEWED: Copies of PET scan and MRI to patient.  Sydney Kin, MD,  [facility] [address] [place]  [address]   [place], [place]    [address] Phone:  [phone] FAX:  [phone]

## 2012-08-25 NOTE — Anesthesia Postprocedure Evaluation (Signed)
Anesthesia Post Note  Patient: Sydney Cervantes  Procedure(s) Performed: Procedure(s) (LRB): MASTECTOMY MODIFIED RADICAL (Left)  Anesthesia type: General  Patient location: PACU  Post pain: Pain level controlled and Adequate analgesia  Post assessment: Post-op Vital signs reviewed, Patient's Cardiovascular Status Stable, Respiratory Function Stable, Patent Airway and Pain level controlled  Last Vitals:  Filed Vitals:   08/25/12 1130  BP: 126/81  Pulse:   Temp:   Resp:     Post vital signs: Reviewed and stable  Level of consciousness: awake, alert  and oriented  Complications: No apparent anesthesia complications

## 2012-08-25 NOTE — Preoperative (Signed)
Beta Blockers   Reason not to administer Beta Blockers:Not Applicable 

## 2012-08-26 ENCOUNTER — Telehealth (INDEPENDENT_AMBULATORY_CARE_PROVIDER_SITE_OTHER): Payer: Self-pay | Admitting: General Surgery

## 2012-08-26 NOTE — Telephone Encounter (Signed)
Ed, pharmacist at [facility], calling to verify Rx from pt.  Written for Tramadol 50 mg, but only comes in 10 mg for po dosing.  Paged and updated Dr. Ezzard Standing; stated he meant to order Ultram 50 mg, not Tramadol.  Reported same to pharmacist, who will fill Ultram for pt.

## 2012-08-26 NOTE — Discharge Summary (Signed)
Physician Discharge Summary  Patient ID:  Sydney Cervantes  MRN: [id number]  DOB/AGE: [birth year] 51 y.o.  Admit date: 08/25/2012 Discharge date: 08/26/2012  Discharge Diagnoses:  1. Left breast cancer. T4, N3, Mx, at 11 o'clock.   [place] - 4.5 cm, MRI - 7.6 cm extending to pectoralis muscle.   Lobular, grade 3. ER - 62%, PR - 5%, Her@Neu  - neg., Ki67 - 33   Left axillary node biopsy positive.   Treating oncology - Rubin/Wentworth.   Finished chemotx - 08/02/2012   Has had good response to neoadjuvant chemotx with abnormal lymph nodes resolving and the tumor mass smaller.  Final path pending at time of discharge.  2. Level 1, 2 and 3 nodes positive - by MRI.  3. 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  3b. She has a "hot" right supraclavicular node on her original PET scan that is gone. This area will be treated with Rad tx.   4. Power port - 02/11/2012.  5. Genetics - Her BRCA and BART were negative.   CancerNext panel (14 genes) was negative. 07/14/2012  6. 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  7. Left forearm wound from extravasation.  Operation: Procedure(s): Left MASTECTOMY MODIFIED RADICAL on 08/25/2012 by Dr. Algis Downs. Ezzard Standing  Discharged Condition: good     Hospital Course: Sydney Cervantes is an 51 y.o. female who was admitted 08/25/2012 for a left modified radical mastectomy..  She is now 1 day post op and has done very well.  Her husband and daughter are in the room.  I have reviewed the discharge plans with the patient.  Consults: None  Significant Diagnostic Studies:  08/18/2012  **ADDENDUM** CREATED: 08/18/2012 08:20:35  Dictation error: Impression #1 incorrectly references the right breast.  The positive response to neoadjuvant chemotherapy is within the left breast as described in the findings section.  **END ADDENDUM** SIGNED BY: Genevive Bi, M.D.   08/10/2012  *RADIOLOGY REPORT*  Clinical Data: Subsequent treatment strategy for breast carcinoma. Locally  advanced infiltrating lobular carcinoma stageT4 N3. Neoadjuvant chemotherapy completed.  NUCLEAR MEDICINE PET SKULL BASE TO THIGH - 1. chemotherapy with reduction in size and metabolic activity of primary right breast carcinoma. 2.  No residual hypermetabolic axillary or supraclavicular lymph nodes. 3.  No evidence of distant metastasis.  Original Report Authenticated By: Genevive Bi, M.D.    Discharge Exam:  Filed Vitals:   08/26/12 0637  BP: 92/64  Pulse:   Temp:   Resp:     Chest:  Wound covered, doing well.  Minimal JP drainage.  Discharge Medications:    She was given Toradol as a discharger med.    Medication List     As of 08/26/2012  9:35 AM    TAKE these medications         acetaminophen 325 MG tablet   Commonly known as: TYLENOL   Take 650 mg by mouth every 6 (six) hours as needed.      ibuprofen 200 MG tablet   Commonly known as: ADVIL,MOTRIN   Take 200 mg by mouth every 6 (six) hours as needed.      lidocaine-prilocaine cream   Commonly known as: EMLA   Apply 1 application topically as needed. Prior to any blood draws or testing through port.        Disposition: 01-Home or Self Care      Discharge Orders    Future Appointments: Provider: Department: Dept Phone: Center:   09/01/2012 12:30  PM [facility] Nurse [facility] [phone] None   09/01/2012 1:00 PM Lurline Hare, MD [facility] [phone] None   09/01/2012 4:30 PM Kandis Cocking, MD [facility] [phone] None   09/06/2012 4:30 PM Pierce Crane, MD [facility] [phone] None     Future Orders Please Complete By Expires   Diet - low sodium heart healthy      Increase activity slowly         Signed: Ovidio Kin, M.D., FACS  08/26/2012, 9:35 AM

## 2012-08-26 NOTE — Progress Notes (Signed)
General Surgery Note  LOS: 1 day  POD# 1  Assessment/Plan: 1.  Left MASTECTOMY MODIFIED RADICAL - D. Aleeah Greeno - 08/25/2012  For left breast cancer. T4, N3, Mx, at 11 o'clock.   [place] - 4.5 cm, MRI - 7.6 cm extending to pectoralis muscle.   Lobular, grade 3. ER - 62%, PR - 5%, Her@Neu  - neg., Ki67 - 33   Left axillary node biopsy positive.   Treating oncology - Rubin/Wentworth.   Finished neoadjuvant chemotx - 08/02/2012    She has done well.    Discharge instructions reviewed with the patient.  To go home today.  2. Level 1, 2 and 3 nodes positive - by MRI.  3. 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  3b. She has a "hot" right supraclavicular node on her original PET scan that is gone.  This area will be treated with Rad tx.   4. Power port - 02/11/2012.   Flushed intraoperatively. 5. Genetics - Her BRCA and BART were negative.   CancerNext panel (14 genes) was negative. 07/14/2012  6. 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  7. Left forearm wound from extravasation - healed.  Subjective:  Doing okay.  No nausea.  Understands dressing/drain management.  Her husband and daughter are in the room.   Objective:   Filed Vitals:   08/26/12 0637  BP: 92/64  Pulse:   Temp:   Resp:      Intake/Output from previous day:  09/19 0701 - 09/20 0700 In: 3802.7 [P.O.:480; I.V.:3322.7] Out: 98 [Drains:98]  Intake/Output this shift:      Physical Exam:   General: WN WF who is alert and oriented.    HEENT: Normal. Pupils equal. .   Lungs: Clear   Wound: Wrapped in breast binder.  Drainage okay.     Lab Results:   No results found for this basename: WBC:2,HGB:2,HCT:2,PLT:2 in the last 72 hours  BMET   Basename 08/25/12 1336  NA --  K --  CL --  CO2 --  GLUCOSE --  BUN --  CREATININE 0.78  CALCIUM --    PT/INR  No results found for this basename: LABPROT:2,INR:2 in the last 72 hours  ABG  No results found for this basename: PHART:2,PCO2:2,PO2:2,HCO3:2 in  the last 72 hours   Studies/Results:  No results found.   Anti-infectives:   Anti-infectives    None     Ovidio Kin, MD, FACS Pager: [phone],   [facility] Surgery Office: [phone] 08/26/2012

## 2012-08-27 ENCOUNTER — Telehealth (INDEPENDENT_AMBULATORY_CARE_PROVIDER_SITE_OTHER): Payer: Self-pay | Admitting: General Surgery

## 2012-08-27 NOTE — Telephone Encounter (Signed)
Allergic to adhesive and having itching.  She called to ask if she could take benadryl and I think that this should be okay.  I counseled her to not take it together with her pain meds as they are both sedating.

## 2012-08-29 ENCOUNTER — Encounter: Payer: Self-pay | Admitting: Oncology

## 2012-08-31 ENCOUNTER — Encounter: Payer: Self-pay | Admitting: Radiation Oncology

## 2012-08-31 ENCOUNTER — Telehealth: Payer: Self-pay | Admitting: *Deleted

## 2012-08-31 NOTE — Telephone Encounter (Signed)
per md schedule moved patient to 09-13-2012 at 4:30pm patient confirmed over the phone

## 2012-08-31 NOTE — Progress Notes (Signed)
51 year old female. Married with three children.  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 ER/PR positive at 63/5% respectively, HER-2 negative, elevated Ki-67. Completed 4/6 planned neoadjuvant dose dense FEC with Neulasta support. Switched to single agent neoadjuvant Taxol, given 3 weeks on 1 week off, completed 4 cycles. BRCA negative. Left modified radical mastectomy excision of interior skin flap done 08/25/12 by Dr. Ezzard Standing.   Seen by Dr. Michell Heinrich in [place] on 02/03/2012.  NKDA No indication of a pacemaker No hx of radiation therapy

## 2012-09-01 ENCOUNTER — Ambulatory Visit
Admission: RE | Admit: 2012-09-01 | Discharge: 2012-09-01 | Disposition: A | Discharge status: Home / Self Care | Payer: Commercial Managed Care - PPO | Source: Ambulatory Visit | Attending: Radiation Oncology | Admitting: Radiation Oncology

## 2012-09-01 ENCOUNTER — Ambulatory Visit (INDEPENDENT_AMBULATORY_CARE_PROVIDER_SITE_OTHER): Payer: Commercial Managed Care - PPO | Admitting: Surgery

## 2012-09-01 ENCOUNTER — Encounter (INDEPENDENT_AMBULATORY_CARE_PROVIDER_SITE_OTHER): Payer: Self-pay | Admitting: Surgery

## 2012-09-01 ENCOUNTER — Encounter: Payer: Self-pay | Admitting: Radiation Oncology

## 2012-09-01 VITALS — BP 117/75 | HR 123 | Temp 98.0°F | Wt 159.6 lb

## 2012-09-01 VITALS — BP 123/80 | HR 78 | Temp 97.7°F | Resp 18 | Ht 68.0 in | Wt 158.6 lb

## 2012-09-01 DIAGNOSIS — L988 Other specified disorders of the skin and subcutaneous tissue: Secondary | ICD-10-CM | POA: Insufficient documentation

## 2012-09-01 DIAGNOSIS — R209 Unspecified disturbances of skin sensation: Secondary | ICD-10-CM | POA: Insufficient documentation

## 2012-09-01 DIAGNOSIS — C773 Secondary and unspecified malignant neoplasm of axilla and upper limb lymph nodes: Secondary | ICD-10-CM | POA: Insufficient documentation

## 2012-09-01 DIAGNOSIS — C50519 Malignant neoplasm of lower-outer quadrant of unspecified female breast: Secondary | ICD-10-CM

## 2012-09-01 DIAGNOSIS — Z9010 Acquired absence of unspecified breast and nipple: Secondary | ICD-10-CM | POA: Insufficient documentation

## 2012-09-01 DIAGNOSIS — Z170 Estrogen receptor positive status [ER+]: Secondary | ICD-10-CM | POA: Insufficient documentation

## 2012-09-01 DIAGNOSIS — Z9221 Personal history of antineoplastic chemotherapy: Secondary | ICD-10-CM | POA: Insufficient documentation

## 2012-09-01 DIAGNOSIS — Z510 Encounter for antineoplastic radiation therapy: Secondary | ICD-10-CM | POA: Insufficient documentation

## 2012-09-01 NOTE — Progress Notes (Signed)
New consult today for cancer of the left breast.  Tenderness and soreness of left breast with numbness  In the axilla and the inner  portion of left upper arm and the back of the left arm.  Reports decrease ROM left arm.  Denies need for pain meds.  Completed chemotherapy on August 27th.  Complete alopecia.  Has 2 JP drains with light red drainage.  To see Dr. Ovidio Kin today at 4pm.   States she is sleepy presently.

## 2012-09-01 NOTE — Progress Notes (Signed)
   Department of Radiation Oncology  Phone:  [phone] Fax:        [phone]   Name: Sydney Cervantes   DOB: 07-Jun-1961  MRN: [id number]    Date: 09/01/2012  Follow Up Visit Note  Diagnosis: ypT0N3 invasive lobular carcinoma ER+PR 5% Her2 neg s/p mastecomy and alnd  Interval History: Sydney Cervantes presents today for routine followup.  She has recovered well from her surgery. She had an excellent response in the breast and had no residual carcinoma. She unfortunately had 7/8 lymph nodes positive with macro metastases and extracapsular extension. Her tumor continues to be ER/PR positive and HER-2 was repeated and was negative on the lymph nodes. A repeat PET scan prior to surgery showed that the activity in the supraclavicular region on the right and diminished. She is doing some stretching exercises and is ready to proceed on with radiation. She is not really having any swelling in her left arm although she is having numbness in her axilla and some lightening type pains down her left arm.  Allergies:  Allergies  Allergen Reactions  . Adhesive (Tape) Itching and Rash    Rash and itching at steristrip and EKG lead pad sites    Medications:  Current Outpatient Prescriptions  Medication Sig Dispense Refill  . acetaminophen (TYLENOL) 325 MG tablet Take 650 mg by mouth every 6 (six) hours as needed.      Marland Kitchen ibuprofen (ADVIL,MOTRIN) 200 MG tablet Take 200 mg by mouth every 6 (six) hours as needed.      . lidocaine-prilocaine (EMLA) cream Apply 1 application topically as needed. Prior to any blood draws or testing through port.      . traMADol (ULTRAM) 50 MG tablet Take 50 mg by mouth every 6 (six) hours as needed.      . diphenhydrAMINE (SOMINEX) 25 MG tablet Take 25 mg by mouth as needed.        Physical Exam:   weight is 159 lb 9.6 oz (72.394 kg). Her temperature is 98 F (36.7 C). Her blood pressure is 117/75 and her pulse is 123.  She is alert and oriented. She has alopecia. She has a  drain in place.  IMPRESSION: Sydney Cervantes is a 51 y.o. female status post neoadjuvant chemotherapy and mastectomy with a complete response within the breast and 7/8 lymph nodes positive, as well as a history of positive left internal mammary lymph nodes and right supraclavicular lymph nodes  PLAN:  I talked to [place] and her husband today about treatment. We discussed the use of breath hold. We discussed the treatment of her right supraclavicular fossa, chest wall, supraclavicular lymph nodes and left internal mammary lymph nodes. We discussed the process of simulation the placement tattoos. We discussed use of breath hold for cardiac sparing. We discussed rib lung and heart damage. We discussed secondary malignancies. She has signed informed consent and agree to proceed forward. We discussed 33 treatments as an outpatient. We discussed the increased complication seen in patients who undergo reconstruction after radiation. She is ready to proceed on with radiation as soon as her drains have been told. I have scheduled her for simulation next week and hopes that she will be released by Dr. Ezzard Standing. If she does have drains next week she can call and we will reschedule that appointment. I've also given her information on our patient family support services as well as on her ABC class at physical therapy.    Lurline Hare, MD

## 2012-09-01 NOTE — Progress Notes (Signed)
Re:   Sydney Cervantes DOB:   Aug 17, 1961 MRN:   [id number]  [facility]  ASSESSMENT AND PLAN: 1.  Left breast cancer.  T4, N3, Mx, at 11 o'clock.  Original [place] - 4.5 cm mass, MRI - 7.6 cm (01/19/2012) mass extending to pectoralis muscle.  Lobular, grade 3.  ER - 62%, PR - 5%, Her2Neu - neg., Ki67 - 33  Treating oncology - Rubin/Wentworth.  Neoadjuvant chemotx  finished  08/02/2012.  Left MRM - 08/25/2012 - D. Kamylle Axelson  Final Path - no residual cancer in the breast, but 7/8 nodes positive.  Wound looks good - both drains removed.  To see Dr. Michell Heinrich in 2 weeks to set up left chest wall irradiation.  To see me back in 6 months.   2.  Level 1, 2 and 3 nodes positive - by MRI. 3.  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  4.  Power port - 02/11/2012.  5.  BRCA 1/2 neg - April 2013.  CancerNext panel (14 genes) was negative.  07/14/2012 6.  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 7.  Left forearm wound from extravasation.     REFERRING PHYSICIAN:  Dag Pavic, [facility].  HISTORY OF PRESENT ILLNESS: Sydney Cervantes is a 51 y.o. (DOB: [birth year])  whtie female whose primary care physician is Sydney Dus, NP, [facility], and comes to me today for follow up of left breast cancer and left MRM which I did 08/25/2012.  Husband is with her.  She is doing well.  She continues to be optimistic.  She brought with her drainage amounts from her drains.  I went over path findings with her and discussed PT and the ABC classes.  Breast family history: Her mother had breast cancer in her 50's.  She then had a recurrence.  She is still living.      Past Medical History  Diagnosis Date  . Hx of colonoscopy 2000  . Wears glasses   . Seasonal allergies     "when molds and things come out; pollens" (08/25/2012)  . Breast cancer     left  . Anemia     "chemo induced" (08/25/2012)  . GERD (gastroesophageal reflux disease)     occ; "side effect of chemo" (08/25/2012)     Current Outpatient  Prescriptions  Medication Sig Dispense Refill  . acetaminophen (TYLENOL) 325 MG tablet Take 650 mg by mouth every 6 (six) hours as needed.      . diphenhydrAMINE (SOMINEX) 25 MG tablet Take 25 mg by mouth as needed.      Marland Kitchen ibuprofen (ADVIL,MOTRIN) 200 MG tablet Take 200 mg by mouth every 6 (six) hours as needed.      . lidocaine-prilocaine (EMLA) cream Apply 1 application topically as needed. Prior to any blood draws or testing through port.      . traMADol (ULTRAM) 50 MG tablet Take 50 mg by mouth every 6 (six) hours as needed.         Allergies  Allergen Reactions  . Adhesive (Tape) Itching and Rash    Rash and itching at steristrip and EKG lead pad sites    REVIEW OF SYSTEMS:  Gastrointestinal: Lap chole - 2000's by Dr. Carolynne Edouard, Appendiceal carcinoid - 1987, last colonoscopy about 2000.  SOCIAL and FAMILY HISTORY: Married.  Husband, Sydney Cervantes, with patient. They have 3 daughters - 22,19, 4.  (One daughter made me a pink bracelet) She works for [employer] and subs for [employer]  schools.  PHYSICAL EXAM: BP 123/80  Pulse 78  Temp 97.7 F (36.5 C) (Temporal)  Resp 18  Ht 5\' 8"  (1.727 m)  Wt 158 lb 9.6 oz (71.94 kg)  BMI 24.11 kg/m2  LMP 02/05/2012  General: WN WF who is alert and generally healthy appearing.  Breasts:  Left:  Absent.  Post op with steristrips but the wound looks good.  Two drains coming out inferior to the incision.  I removed both drains today, in that they were draining less than 20 cc each.  DATA REVIEWED: Path report to patient.  Ovidio Kin, MD,  [facility] [address] [place]  [address]   [place], [place]    [address] Phone:  [phone] FAX:  [phone]

## 2012-09-02 NOTE — Addendum Note (Signed)
Encounter addended by: Delynn Flavin, RN on: 09/02/2012  5:30 PM<BR>     Documentation filed: Charges VN

## 2012-09-05 NOTE — Addendum Note (Signed)
Encounter addended by: [place] Devine Mintz Keva Darty, RN on: 09/05/2012  7:19 PM<BR>     Documentation filed: Charges VN

## 2012-09-06 ENCOUNTER — Ambulatory Visit: Payer: Commercial Managed Care - PPO | Admitting: Oncology

## 2012-09-13 ENCOUNTER — Ambulatory Visit (HOSPITAL_BASED_OUTPATIENT_CLINIC_OR_DEPARTMENT_OTHER): Payer: Commercial Managed Care - PPO | Admitting: Oncology

## 2012-09-13 ENCOUNTER — Ambulatory Visit
Admission: RE | Admit: 2012-09-13 | Discharge: 2012-09-13 | Disposition: A | Discharge status: Home / Self Care | Payer: Commercial Managed Care - PPO | Source: Ambulatory Visit | Attending: Radiation Oncology | Admitting: Radiation Oncology

## 2012-09-13 ENCOUNTER — Telehealth: Payer: Self-pay | Admitting: Oncology

## 2012-09-13 VITALS — BP 119/86 | HR 103 | Temp 98.0°F | Resp 20 | Ht 68.0 in | Wt 160.9 lb

## 2012-09-13 DIAGNOSIS — C50519 Malignant neoplasm of lower-outer quadrant of unspecified female breast: Secondary | ICD-10-CM

## 2012-09-13 DIAGNOSIS — C773 Secondary and unspecified malignant neoplasm of axilla and upper limb lymph nodes: Secondary | ICD-10-CM

## 2012-09-13 DIAGNOSIS — Z170 Estrogen receptor positive status [ER+]: Secondary | ICD-10-CM

## 2012-09-13 MED ORDER — TAMOXIFEN CITRATE 20 MG PO TABS: 20.0000 mg | ORAL_TABLET | Freq: Every day | ORAL | Status: DC

## 2012-09-13 MED ORDER — INFLUENZA VIRUS VACC SPLIT PF IM SUSP
0.5000 mL | Freq: Once | INTRAMUSCULAR | Status: DC
  Filled 2012-09-13: qty 0.5

## 2012-09-13 NOTE — Progress Notes (Signed)
Hematology and Oncology Follow Up Visit  Sydney Cervantes [id number] 06-18-1961 51 y.o.    HPI: 51 year old [place] woman with: 1. 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 ER/PR positive at 63/5% respectively, HER-2 negative, elevated Ki-67.  Completed 4/6 planned neoadjuvant dose dense FEC with Neulasta support on day 2. 20% dose reduction on 5-FU, 50% dose reduction on epirubicin, 40% dose reduction on Cytoxan, after one-week delay due to anemia/thrombocytopenia/and region of probable localized thrombophlebitis versus mild chemotherapy extravasation on the left forearm, covered with Keflex 500 mg by mouth 3 times a day.  Switched to single agent neoadjuvant Taxol, given 3 weeks on 1 week off, planned 4 cycles, due for week 3, cycle 3/4  today. Status post surgery 09/01/2012. Final pathology showing no tumor in the primary tumor bed, there were 7 of 8 involved lymph nodes. The final pathology showed an ER positive tumor, HER-2 negative. She is recovering well from surgery. She has been seen by radiation oncology, simulation is been performed she is due to start radiation therapy next week. She has a little bit of tenderness in her left breast as well as some swelling and decreased range of motion which she is working with the rehabilitation . 5. BRCA negative.   Interim History:   As noted above..   Medications:   I have reviewed the patient's current medications.  Allergies:  Allergies  Allergen Reactions  . Adhesive (Tape) Itching and Rash    Rash and itching at steristrip and EKG lead pad sites   Physical Exam: Filed Vitals:   09/13/12 1633  BP: 119/86  Pulse: 103  Temp: 98 F (36.7 C)  Resp: 20    Body mass index is 24.46 kg/(m^2). Weight: 162 lbs. General: Well developed, well nourished, in no acute distress.  EENT: No ocular or oral lesions. No  stomatitis.  Respiratory: Lungs are clear to auscultation bilaterally with normal respiratory movement and no accessory muscle use. Cardiac: No murmur, rub or tachycardia. No upper or lower extremity edema.  GI: Abdomen is soft, no palpable hepatosplenomegaly. No fluid wave. No tenderness. Musculoskeletal: No kyphosis, no tenderness over the spine, ribs or hips. Lymph: No cervical, infraclavicular, axillary or inguinal adenopathy. Neuro: No focal neurological deficits. Psych: Alert and oriented X 3, appropriate mood and affect.  Skin: Left forearm, a 2 cm x 1.5 cm indurated area with hyperpigmentation.   Lab Results: Lab Results  Component Value Date   WBC 4.1 08/19/2012   HGB 12.4 08/19/2012   HCT 36.4 08/19/2012   MCV 97.3 08/19/2012   PLT 213 08/19/2012   NEUTROABS 1.8 08/02/2012   Assessment:  1. Locally advanced, clinical stage T4, N3, infiltrating lobular carcinoma of the left breast with lymph node involvement and diffuse skin thickening, on neoadjuvant chemo with good tolerance.  Status post completion of chemotherapy status post lumpectomy with excellent results. She is due to start radiation therapy shortly.  Plan:  I will see Sydney Cervantes back in a about 3 months. She will complete her radiation. I also discussed adjuvant hormonal therapy. She will start taking tamoxifen (December. I discussed potential side effects as well.  I also indicated that after approximately year and we will build to switch to an AI seen she does not retain any menses.  Sydney Cervantes,

## 2012-09-13 NOTE — Telephone Encounter (Signed)
lmonvm adviisng the pt of her oct flush appt and her jan 2014 appts

## 2012-09-15 NOTE — Progress Notes (Signed)
Name: DELPHINIA SENDRA   MRN: [id number]  Date:  09/15/2012  DOB: January 28, 1961  Status:outpatient    DIAGNOSIS: Breast cancer.  CONSENT VERIFIED: yes  SET UP: Patient is setup supine  IMMOBILIZATION:  The following immobilization was used:Custom Moldable Pillow, breast board.  NARRATIVE: Ms. Guelker was brought to the CT Simulation planning suite.  Identity was confirmed.  All relevant records and images related to the planned course of therapy were reviewed.  Then, the patient was positioned in a stable reproducible clinical set-up for radiation therapy.  Wires were placed to delineate the clinical extent of breast tissue. A wire was placed on the scar as well.  CT images were obtained.  An isocenter was placed first for treatment of the left breast.  An isocenter was then placed directly across for treatment of the right supraclavicular fossa. Skin markings were placed.  The CT images were loaded into the planning software where the target and avoidance structures were contoured.  The radiation prescription was entered and confirmed. The patient was discharged in stable condition and tolerated simulation well.   TREATMENT PLANNING NOTE:  Treatment planning then occurred. I have requested : MLC's, isodose plan, basic dose calculation

## 2012-09-20 ENCOUNTER — Ambulatory Visit
Admission: RE | Admit: 2012-09-20 | Discharge: 2012-09-20 | Disposition: A | Discharge status: Home / Self Care | Payer: Commercial Managed Care - PPO | Source: Ambulatory Visit | Attending: Radiation Oncology | Admitting: Radiation Oncology

## 2012-09-20 DIAGNOSIS — C50519 Malignant neoplasm of lower-outer quadrant of unspecified female breast: Secondary | ICD-10-CM

## 2012-09-21 ENCOUNTER — Ambulatory Visit
Admission: RE | Admit: 2012-09-21 | Discharge: 2012-09-21 | Disposition: A | Discharge status: Home / Self Care | Payer: Commercial Managed Care - PPO | Source: Ambulatory Visit | Attending: Radiation Oncology | Admitting: Radiation Oncology

## 2012-09-21 DIAGNOSIS — C50519 Malignant neoplasm of lower-outer quadrant of unspecified female breast: Secondary | ICD-10-CM

## 2012-09-22 ENCOUNTER — Ambulatory Visit
Admission: RE | Admit: 2012-09-22 | Discharge: 2012-09-22 | Disposition: A | Discharge status: Home / Self Care | Payer: Commercial Managed Care - PPO | Source: Ambulatory Visit | Attending: Radiation Oncology | Admitting: Radiation Oncology

## 2012-09-23 ENCOUNTER — Ambulatory Visit
Admission: RE | Admit: 2012-09-23 | Discharge: 2012-09-23 | Disposition: A | Discharge status: Home / Self Care | Payer: Commercial Managed Care - PPO | Source: Ambulatory Visit | Attending: Radiation Oncology | Admitting: Radiation Oncology

## 2012-09-23 NOTE — Progress Notes (Signed)
  Radiation Oncology         (336) [phone] ________________________________  Name: Sydney Cervantes MRN: [id number]  Date: 09/20/2012  DOB: 24-Feb-1961  Simulation Verification Note  Status: outpatient  NARRATIVE: The patient was brought to the treatment unit and placed in the planned treatment position. The clinical setup was verified. Then port films were obtained and uploaded to the radiation oncology medical record software.  The treatment beams were carefully compared against the planned radiation fields. The position location and shape of the radiation fields was reviewed. The targeted volume of tissue appears appropriately covered by the radiation beams. Organs at risk appear to be excluded as planned.  Based on my personal review, I approved the simulation verification. The patient's treatment will proceed as planned.  ------------------------------------------------  Lurline Hare, MD

## 2012-09-26 ENCOUNTER — Ambulatory Visit
Admission: RE | Admit: 2012-09-26 | Discharge: 2012-09-26 | Disposition: A | Discharge status: Home / Self Care | Payer: Commercial Managed Care - PPO | Source: Ambulatory Visit | Attending: Radiation Oncology | Admitting: Radiation Oncology

## 2012-09-27 ENCOUNTER — Ambulatory Visit
Admission: RE | Admit: 2012-09-27 | Discharge: 2012-09-27 | Disposition: A | Discharge status: Home / Self Care | Payer: Commercial Managed Care - PPO | Source: Ambulatory Visit | Attending: Radiation Oncology | Admitting: Radiation Oncology

## 2012-09-27 ENCOUNTER — Encounter: Payer: Self-pay | Admitting: Radiation Oncology

## 2012-09-27 VITALS — BP 107/77 | HR 88 | Resp 18 | Wt 161.5 lb

## 2012-09-27 DIAGNOSIS — C50519 Malignant neoplasm of lower-outer quadrant of unspecified female breast: Secondary | ICD-10-CM

## 2012-09-27 MED ORDER — RADIAPLEXRX EX GEL
Freq: Once | CUTANEOUS | Status: AC
  Administered 2012-09-27: 19:00:00 via TOPICAL

## 2012-09-27 NOTE — Progress Notes (Signed)
Weekly Management Note Current Dose: 9 Gy  Projected Dose: 60.4 Gy   Narrative:  The patient presents for routine under treatment assessment.  CBCT/MVCT images/Port film x-rays were reviewed.  The chart was checked. C/o numbness under axilla and slight swollen feeling. Particularly worse after working all day and using her arm.  Attended ABC class.   Physical Findings: Weight: 161 lb 8 oz (73.256 kg). Unchanged. No noticeable lymphedema.   Impression:  The patient is tolerating radiation.  Plan:  Continue treatment as planned. Pt will contact PT to see if she should get a referral or attend abc again.

## 2012-09-27 NOTE — Addendum Note (Signed)
Encounter addended by: Agnes Lawrence, RN on: 09/27/2012  6:47 PM<BR>     Documentation filed: Inpatient MAR, Orders

## 2012-09-27 NOTE — Progress Notes (Signed)
Patient presents to the clinic today unaccompanied for PUT with Dr. Michell Heinrich. Patient alert and oriented to person, place, and time. No distress noted. Steady gait noted. Pleasant affect noted. Patient denies pain at this time. Faint hyperpigmentation without desquamation noted at left supraclavicular area. Patient reports using Radiaplex as directed. Patient reports that it feels like her left axilla and arm are swollen. Patient very concerned about lymphedema. Patient reports her energy level is "OK" Reported all findings to Dr. Michell Heinrich.

## 2012-09-28 ENCOUNTER — Ambulatory Visit: Payer: Commercial Managed Care - PPO

## 2012-09-28 ENCOUNTER — Ambulatory Visit
Admission: RE | Admit: 2012-09-28 | Discharge: 2012-09-28 | Disposition: A | Discharge status: Home / Self Care | Payer: Commercial Managed Care - PPO | Source: Ambulatory Visit | Attending: Radiation Oncology | Admitting: Radiation Oncology

## 2012-09-29 ENCOUNTER — Ambulatory Visit
Admission: RE | Admit: 2012-09-29 | Discharge: 2012-09-29 | Disposition: A | Discharge status: Home / Self Care | Payer: Commercial Managed Care - PPO | Source: Ambulatory Visit | Attending: Radiation Oncology | Admitting: Radiation Oncology

## 2012-09-29 ENCOUNTER — Ambulatory Visit: Payer: Commercial Managed Care - PPO

## 2012-09-30 ENCOUNTER — Ambulatory Visit
Admission: RE | Admit: 2012-09-30 | Discharge: 2012-09-30 | Disposition: A | Discharge status: Home / Self Care | Payer: Commercial Managed Care - PPO | Source: Ambulatory Visit | Attending: Radiation Oncology | Admitting: Radiation Oncology

## 2012-09-30 ENCOUNTER — Ambulatory Visit: Admission: RE | Admit: 2012-09-30 | Payer: Commercial Managed Care - PPO | Source: Ambulatory Visit

## 2012-09-30 ENCOUNTER — Ambulatory Visit: Payer: Commercial Managed Care - PPO

## 2012-10-03 ENCOUNTER — Ambulatory Visit
Admission: RE | Admit: 2012-10-03 | Discharge: 2012-10-03 | Disposition: A | Discharge status: Home / Self Care | Payer: Commercial Managed Care - PPO | Source: Ambulatory Visit | Attending: Radiation Oncology | Admitting: Radiation Oncology

## 2012-10-03 ENCOUNTER — Ambulatory Visit: Payer: Commercial Managed Care - PPO

## 2012-10-03 DIAGNOSIS — C50519 Malignant neoplasm of lower-outer quadrant of unspecified female breast: Secondary | ICD-10-CM

## 2012-10-03 MED ORDER — RADIAPLEXRX EX GEL
Freq: Once | CUTANEOUS | Status: AC
  Administered 2012-10-03: 18:00:00 via TOPICAL

## 2012-10-03 NOTE — Progress Notes (Signed)
Late entry from 09/21/2012 at 1249. Received patient in the clinic for post sim education with Sam, RN. Oriented patient to staff and routine of the clinic. Provided patient with RADIATION THERAPY AND YOU handbook then, reviewed pertinent information. Educated patient on potential side effects and management, such as skin changes and fatigue. Provided patient with Radiaplex gel and directed upon use. Provided patient with this writer's business card and encouraged to call with needs. All questions answered. Patient verbalized understanding of all things reviewed.

## 2012-10-04 ENCOUNTER — Ambulatory Visit: Payer: Commercial Managed Care - PPO

## 2012-10-04 ENCOUNTER — Ambulatory Visit
Admission: RE | Admit: 2012-10-04 | Discharge: 2012-10-04 | Disposition: A | Discharge status: Home / Self Care | Payer: Commercial Managed Care - PPO | Source: Ambulatory Visit | Attending: Radiation Oncology | Admitting: Radiation Oncology

## 2012-10-04 ENCOUNTER — Ambulatory Visit (HOSPITAL_BASED_OUTPATIENT_CLINIC_OR_DEPARTMENT_OTHER): Payer: Commercial Managed Care - PPO

## 2012-10-04 VITALS — BP 107/73 | HR 103 | Temp 98.5°F | Wt 161.9 lb

## 2012-10-04 VITALS — BP 108/74 | HR 101 | Temp 98.2°F

## 2012-10-04 DIAGNOSIS — Z452 Encounter for adjustment and management of vascular access device: Secondary | ICD-10-CM

## 2012-10-04 DIAGNOSIS — C50919 Malignant neoplasm of unspecified site of unspecified female breast: Secondary | ICD-10-CM

## 2012-10-04 DIAGNOSIS — C50519 Malignant neoplasm of lower-outer quadrant of unspecified female breast: Secondary | ICD-10-CM

## 2012-10-04 MED ORDER — SODIUM CHLORIDE 0.9 % IJ SOLN
10.0000 mL | INTRAMUSCULAR | Status: DC | PRN
  Administered 2012-10-04: 10 mL via INTRAVENOUS
  Filled 2012-10-04: qty 10

## 2012-10-04 MED ORDER — HEPARIN SOD (PORK) LOCK FLUSH 100 UNIT/ML IV SOLN
500.0000 [IU] | Freq: Once | INTRAVENOUS | Status: AC
  Administered 2012-10-04: 500 [IU] via INTRAVENOUS
  Filled 2012-10-04: qty 5

## 2012-10-04 NOTE — Progress Notes (Signed)
Patient here for weekly under treat visit.Has some mild discoloration.Skin intact.Mild fatigue.Wearing jobe stocking on left arm and continues to perform arm exercises.

## 2012-10-04 NOTE — Progress Notes (Signed)
Weekly Management Note Current Dose: 18  Gy  Projected Dose: 50.4 Gy   Narrative:  The patient presents for routine under treatment assessment.  CBCT/MVCT images/Port film x-rays were reviewed.  The chart was checked. Doing well. No complaints. Questions about treatment of axilla. Swelling better in arm but less in shoulder. Skin feels sunburned.  Physical Findings: Weight: 161 lb 14.4 oz (73.437 kg). Unchanged  Impression:  The patient is tolerating radiation.  Plan:  Continue treatment as planned. Will reexamine nodes and PAB field.

## 2012-10-05 ENCOUNTER — Ambulatory Visit
Admission: RE | Admit: 2012-10-05 | Discharge: 2012-10-05 | Disposition: A | Discharge status: Home / Self Care | Payer: Commercial Managed Care - PPO | Source: Ambulatory Visit | Attending: Radiation Oncology | Admitting: Radiation Oncology

## 2012-10-05 ENCOUNTER — Ambulatory Visit: Payer: Commercial Managed Care - PPO

## 2012-10-06 ENCOUNTER — Ambulatory Visit
Admission: RE | Admit: 2012-10-06 | Discharge: 2012-10-06 | Disposition: A | Discharge status: Home / Self Care | Payer: Commercial Managed Care - PPO | Source: Ambulatory Visit | Attending: Radiation Oncology | Admitting: Radiation Oncology

## 2012-10-06 ENCOUNTER — Ambulatory Visit: Payer: Commercial Managed Care - PPO

## 2012-10-07 ENCOUNTER — Encounter: Payer: Self-pay | Admitting: Radiation Oncology

## 2012-10-07 ENCOUNTER — Ambulatory Visit
Admission: RE | Admit: 2012-10-07 | Discharge: 2012-10-07 | Disposition: A | Discharge status: Home / Self Care | Payer: Commercial Managed Care - PPO | Source: Ambulatory Visit | Attending: Radiation Oncology | Admitting: Radiation Oncology

## 2012-10-07 ENCOUNTER — Ambulatory Visit: Payer: Commercial Managed Care - PPO

## 2012-10-07 NOTE — Progress Notes (Signed)
I met with the patient today and her daughter. I explained that we were not currently treating her internal mammary lymph nodes nor were we specifically targeting her axilla. I think possible for [place] to come up with a composite plan allows [place] to do both of these things. We discussed the downsides being increasing her lung dose and possibly increasing her risk of lymphedema. She would like to be as aggressive as we possibly can and has agreed to proceed with this treatment. We will work on plan for that later today and then try to start on Monday.

## 2012-10-10 ENCOUNTER — Ambulatory Visit: Payer: Commercial Managed Care - PPO

## 2012-10-10 ENCOUNTER — Ambulatory Visit
Admission: RE | Admit: 2012-10-10 | Discharge: 2012-10-10 | Disposition: A | Discharge status: Home / Self Care | Payer: Commercial Managed Care - PPO | Source: Ambulatory Visit | Attending: Radiation Oncology | Admitting: Radiation Oncology

## 2012-10-10 DIAGNOSIS — C50519 Malignant neoplasm of lower-outer quadrant of unspecified female breast: Secondary | ICD-10-CM

## 2012-10-10 NOTE — Progress Notes (Signed)
  Radiation Oncology         (336) [phone] ________________________________  Name: Sydney Cervantes MRN: [id number]  Date: 10/10/2012  DOB: 03-13-1961  Simulation Verification Note  Status: outpatient  NARRATIVE: The patient was brought to the treatment unit and placed in the planned treatment position. The clinical setup was verified. Then port films were obtained and uploaded to the radiation oncology medical record software.  The treatment beams were carefully compared against the planned radiation fields. The position location and shape of the radiation fields was reviewed. They targeted volume of tissue appears to be appropriately covered by the radiation beams. Organs at risk appear to be excluded as planned.  Based on my personal review, I approved the simulation verification. The patient's treatment will proceed as planned.  -----------------------------------  Billie Lade, PhD, MD

## 2012-10-11 ENCOUNTER — Ambulatory Visit: Payer: Commercial Managed Care - PPO

## 2012-10-11 ENCOUNTER — Ambulatory Visit
Admission: RE | Admit: 2012-10-11 | Discharge: 2012-10-11 | Disposition: A | Discharge status: Home / Self Care | Payer: Commercial Managed Care - PPO | Source: Ambulatory Visit | Attending: Radiation Oncology | Admitting: Radiation Oncology

## 2012-10-11 ENCOUNTER — Encounter: Payer: Self-pay | Admitting: Radiation Oncology

## 2012-10-11 VITALS — BP 113/76 | HR 88 | Resp 18 | Wt 163.0 lb

## 2012-10-11 DIAGNOSIS — C50519 Malignant neoplasm of lower-outer quadrant of unspecified female breast: Secondary | ICD-10-CM

## 2012-10-11 NOTE — Progress Notes (Signed)
Patient presents to the clinic today accompanied by her husband for a PUT with Dr. Roselind Messier. Patient alert and oriented to person, place, and time. No distress noted. Steady gait noted. Pleasant affect noted. Patient denies pain at this time. Patient reports tanning of the left chest wall without desquamation or dermatitis. Patient reports that she continues to use Radiaplex gel as directed. Patient reports decreased energy level. Patient reports shortness of breath after hiking just a couple miles this weekend. Reported all findings to Dr. Roselind Messier.

## 2012-10-11 NOTE — Progress Notes (Signed)
[facility]    Radiation Oncology [address] [place]     Maryln Gottron, M.D. [place], [place] [address]               Billie Lade, M.D., Ph.D. Phone: [phone]      Molli Hazard A. Kathrynn Running, M.D. Fax: [phone]      Radene Gunning, M.D., Ph.D.         Lurline Hare, M.D.         Grayland Jack, M.D Weekly Treatment Management Note  Name: Sydney Cervantes     MRN: [id number]        CSN: [id number] Date: 10/11/2012      DOB: [birth year]  CC: Pcp Not In System         [place]    Status: Outpatient  Diagnosis: The encounter diagnosis was Cancer of lower-outer quadrant of female breast, Left, T4, N3. For neoadjuvant tx..  Current Dose: 2700 cGy  Current Fraction: 15  Planned Dose: 6040 cGy  Narrative: [name] was seen today for weekly treatment management. The chart was checked and port films  were reviewed. She is having some fatigue but denies any significant itching or discomfort along the chest wall area.  Adhesive-allergy  Current Outpatient Prescriptions  Medication Sig Dispense Refill  . acetaminophen (TYLENOL) 325 MG tablet Take 650 mg by mouth every 6 (six) hours as needed.      Marland Kitchen ibuprofen (ADVIL,MOTRIN) 200 MG tablet Take 200 mg by mouth every 6 (six) hours as needed.      . lidocaine-prilocaine (EMLA) cream Apply 1 application topically as needed. Prior to any blood draws or testing through port.      . Wound Cleansers (RADIAPLEX EX) Apply topically.      . tamoxifen (NOLVADEX) 20 MG tablet Take 1 tablet (20 mg total) by mouth daily.  30 tablet  11   Labs:  Lab Results  Component Value Date   WBC 4.1 08/19/2012   HGB 12.4 08/19/2012   HCT 36.4 08/19/2012   MCV 97.3 08/19/2012   PLT 213 08/19/2012   Lab Results  Component Value Date   CREATININE 0.78 08/25/2012   BUN 10 08/19/2012   NA 142 08/19/2012   K 4.3 08/19/2012   CL 106 08/19/2012   CO2 26 08/19/2012   Lab Results  Component Value Date   ALT 45* 06/21/2012   AST 42* 06/21/2012   BILITOT 0.5 06/21/2012    Physical Examination:  weight is 163 lb (73.936 kg). Her blood pressure is 113/76 and her pulse is 88. Her respiration is 18.    Wt Readings from Last 3 Encounters:  10/11/12 163 lb (73.936 kg)  10/04/12 161 lb 14.4 oz (73.437 kg)  09/27/12 161 lb 8 oz (73.256 kg)     Lungs - Normal respiratory effort, chest expands symmetrically. Lungs are clear to auscultation, no crackles or wheezes.  Heart has regular rhythm and rate  Abdomen is soft and non tender with normal bowel sounds The left chest wall area shows some erythema and hyperpigmentation changes but no moist desquamation  Assessment:  Patient tolerating treatments well  Plan: Continue treatment per original radiation prescription

## 2012-10-12 ENCOUNTER — Ambulatory Visit
Admission: RE | Admit: 2012-10-12 | Discharge: 2012-10-12 | Disposition: A | Discharge status: Home / Self Care | Payer: Commercial Managed Care - PPO | Source: Ambulatory Visit | Attending: Radiation Oncology | Admitting: Radiation Oncology

## 2012-10-12 ENCOUNTER — Ambulatory Visit: Payer: Commercial Managed Care - PPO

## 2012-10-13 ENCOUNTER — Ambulatory Visit: Payer: Commercial Managed Care - PPO

## 2012-10-13 ENCOUNTER — Ambulatory Visit
Admission: RE | Admit: 2012-10-13 | Discharge: 2012-10-13 | Disposition: A | Discharge status: Home / Self Care | Payer: Commercial Managed Care - PPO | Source: Ambulatory Visit | Attending: Radiation Oncology | Admitting: Radiation Oncology

## 2012-10-14 ENCOUNTER — Ambulatory Visit: Payer: Commercial Managed Care - PPO

## 2012-10-14 ENCOUNTER — Ambulatory Visit
Admission: RE | Admit: 2012-10-14 | Discharge: 2012-10-14 | Disposition: A | Discharge status: Home / Self Care | Payer: Commercial Managed Care - PPO | Source: Ambulatory Visit | Attending: Radiation Oncology | Admitting: Radiation Oncology

## 2012-10-17 ENCOUNTER — Ambulatory Visit
Admission: RE | Admit: 2012-10-17 | Discharge: 2012-10-17 | Disposition: A | Discharge status: Home / Self Care | Payer: Commercial Managed Care - PPO | Source: Ambulatory Visit | Attending: Radiation Oncology | Admitting: Radiation Oncology

## 2012-10-17 ENCOUNTER — Ambulatory Visit: Payer: Commercial Managed Care - PPO

## 2012-10-18 ENCOUNTER — Ambulatory Visit
Admission: RE | Admit: 2012-10-18 | Discharge: 2012-10-18 | Disposition: A | Discharge status: Home / Self Care | Payer: Commercial Managed Care - PPO | Source: Ambulatory Visit | Attending: Radiation Oncology | Admitting: Radiation Oncology

## 2012-10-18 ENCOUNTER — Ambulatory Visit: Payer: Commercial Managed Care - PPO

## 2012-10-18 ENCOUNTER — Encounter: Payer: Self-pay | Admitting: Radiation Oncology

## 2012-10-18 VITALS — BP 110/76 | HR 87 | Resp 18 | Wt 161.3 lb

## 2012-10-18 DIAGNOSIS — C50519 Malignant neoplasm of lower-outer quadrant of unspecified female breast: Secondary | ICD-10-CM

## 2012-10-18 NOTE — Progress Notes (Signed)
Patient presents to the clinic today accompanied by her husband for a PUT with Dr. Michell Heinrich. Patient alert and oriented to person, place, and time. No distress noted. Steady gait noted. Pleasant affect noted. Patient denies pain at this time. Patient reports that she is resting well. Patient reports that energy is "not too bad." patient's husband concerned her energy level is low and she gives out of breath easily. Hyperpigmentation of chest wall with desquamation noted at axilla and center of chest wall. Patient reports using Radiaplex bid as directed. Encouraged patient to apply neosporin to area of desquamation. Reported all findings to Dr. Michell Heinrich.

## 2012-10-18 NOTE — Progress Notes (Signed)
Weekly Management Note Current Dose: 36 Gy  Projected Dose: 60.4 Gy   Narrative:  The patient presents for routine under treatment assessment.  CBCT/MVCT images/Port film x-rays were reviewed.  The chart was checked. Doing well. Tired. No pain. Short of breath.   Physical Findings: Weight: 161 lb 4.8 oz (73.165 kg). Slightly pink left chest wall.   Impression:  The patient is tolerating radiation.  Plan:  Continue treatment as planned. Fatigue likely RT induced. Unclear short ness of breath. Will monitor and let us know if worsens.  Appears subacute.

## 2012-10-19 ENCOUNTER — Ambulatory Visit
Admission: RE | Admit: 2012-10-19 | Discharge: 2012-10-19 | Disposition: A | Discharge status: Home / Self Care | Payer: Commercial Managed Care - PPO | Source: Ambulatory Visit | Attending: Radiation Oncology | Admitting: Radiation Oncology

## 2012-10-19 ENCOUNTER — Ambulatory Visit: Payer: Commercial Managed Care - PPO

## 2012-10-20 ENCOUNTER — Ambulatory Visit
Admission: RE | Admit: 2012-10-20 | Discharge: 2012-10-20 | Disposition: A | Discharge status: Home / Self Care | Payer: Commercial Managed Care - PPO | Source: Ambulatory Visit | Attending: Radiation Oncology | Admitting: Radiation Oncology

## 2012-10-20 ENCOUNTER — Ambulatory Visit: Payer: Commercial Managed Care - PPO

## 2012-10-21 ENCOUNTER — Ambulatory Visit: Payer: Commercial Managed Care - PPO

## 2012-10-21 ENCOUNTER — Ambulatory Visit
Admission: RE | Admit: 2012-10-21 | Discharge: 2012-10-21 | Disposition: A | Discharge status: Home / Self Care | Payer: Commercial Managed Care - PPO | Source: Ambulatory Visit | Attending: Radiation Oncology | Admitting: Radiation Oncology

## 2012-10-21 IMAGING — RF IR CV CATH INJECTION
3 series · 15 of 16 positions shown · non-contrast
Comparison: Chest x-ray of 03/16/2012

CLINICAL DATA: Port-A-Cath placed February 2012.  Sensation in throat
during saline infusion.  Rule out leak or misplacement catheter.

CV CATH INJECTION

[Series 1: run · 7 of 20 slices shown (1 of 3)]
[im 1/20]
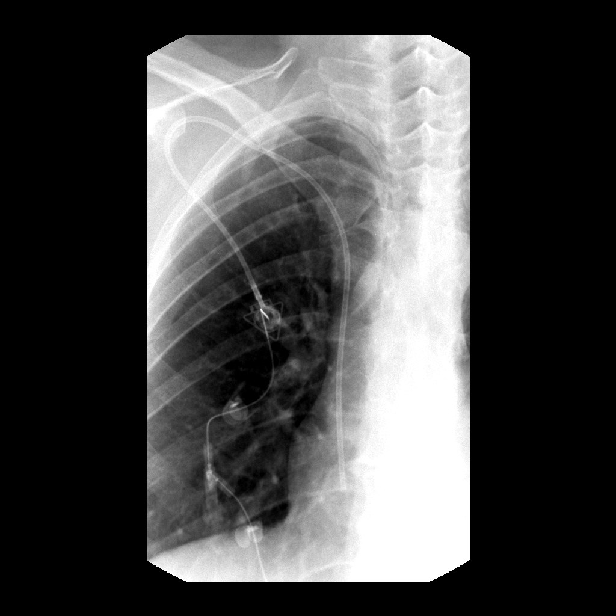
[im 4/20]
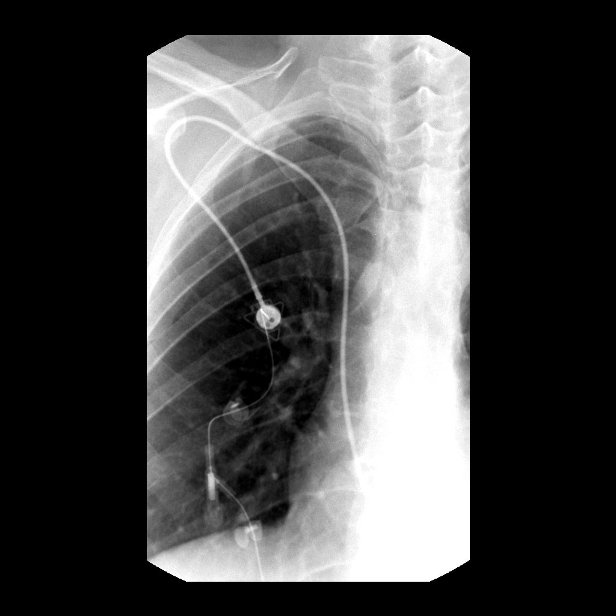
[im 7/20]
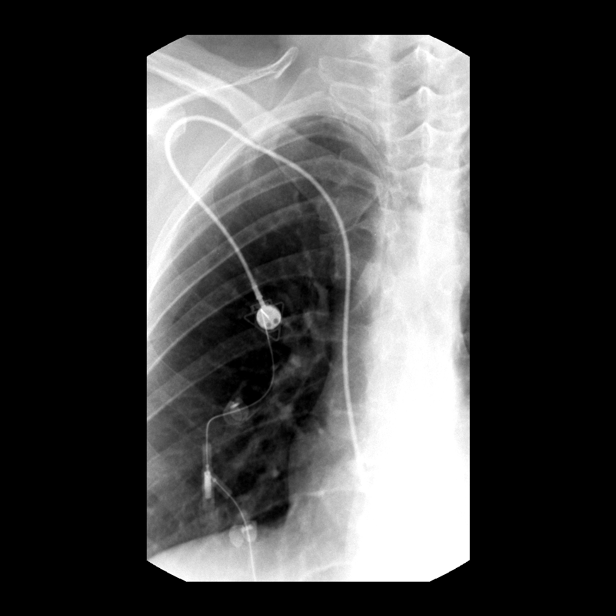
[im 10/20]
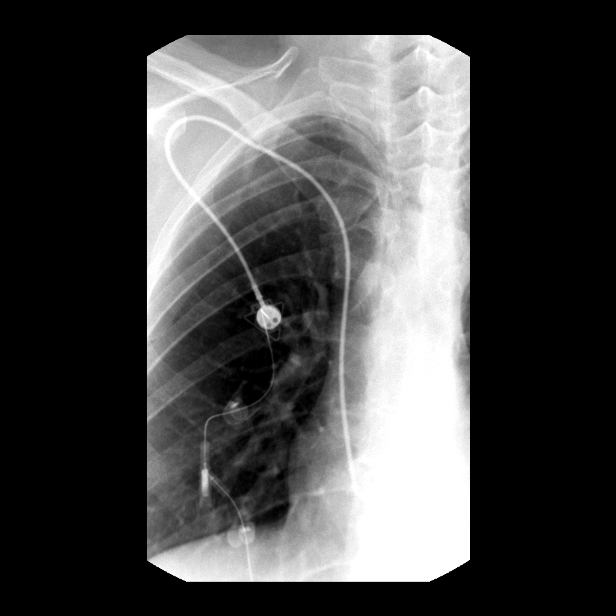
[im 13/20]
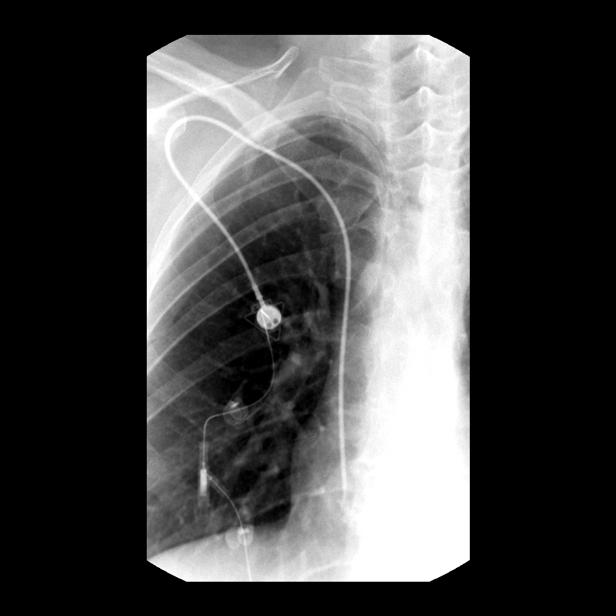
[im 16/20]
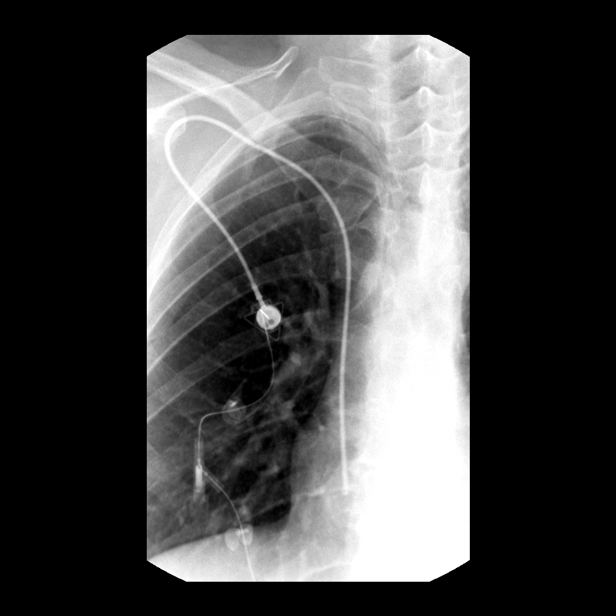
[im 20/20]
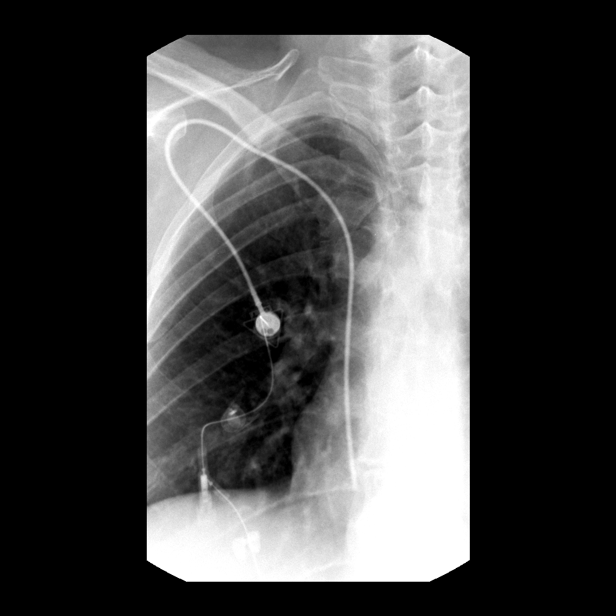

[Series 2: run · 4 of 15 slices shown (2 of 3)]
[im 4/15]
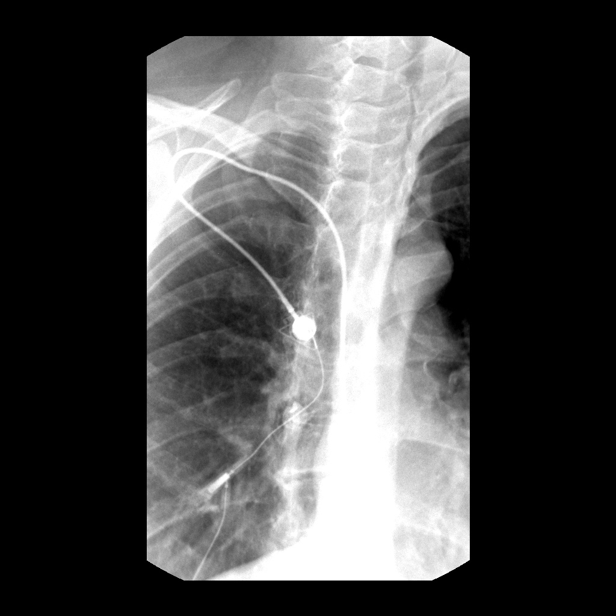
[im 8/15]
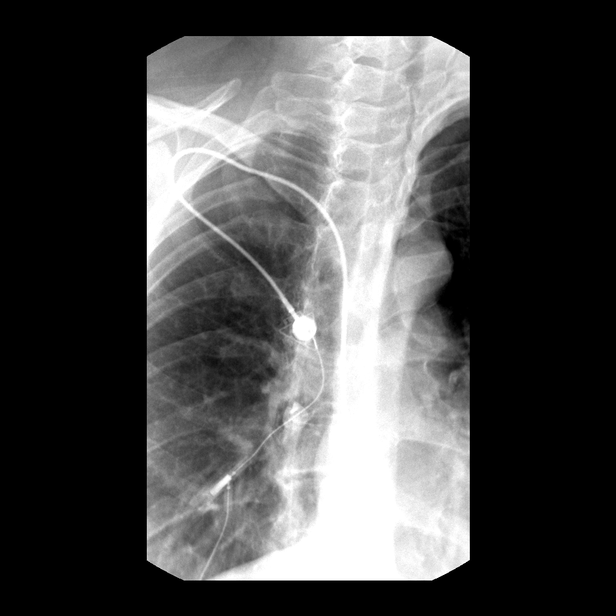
[im 11/15]
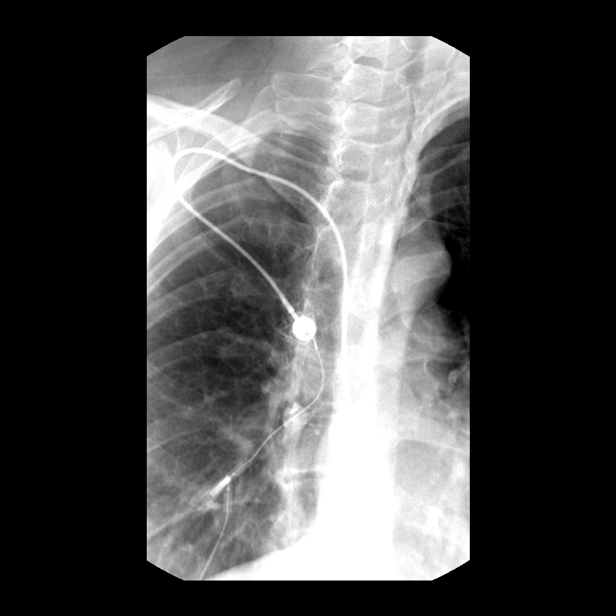
[im 15/15]
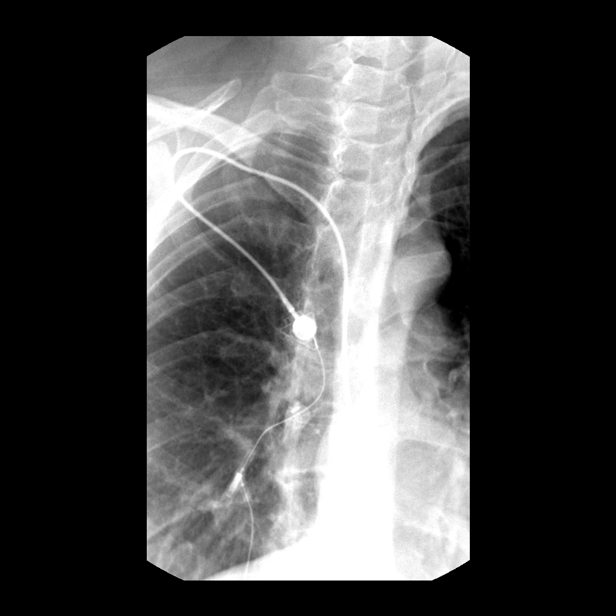

[Series 3: run · 4 of 13 slices shown (3 of 3)]
[im 1/13]
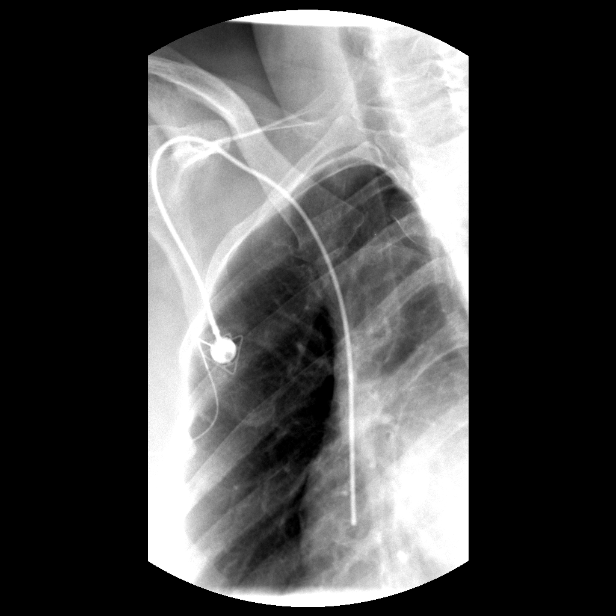
[im 5/13]
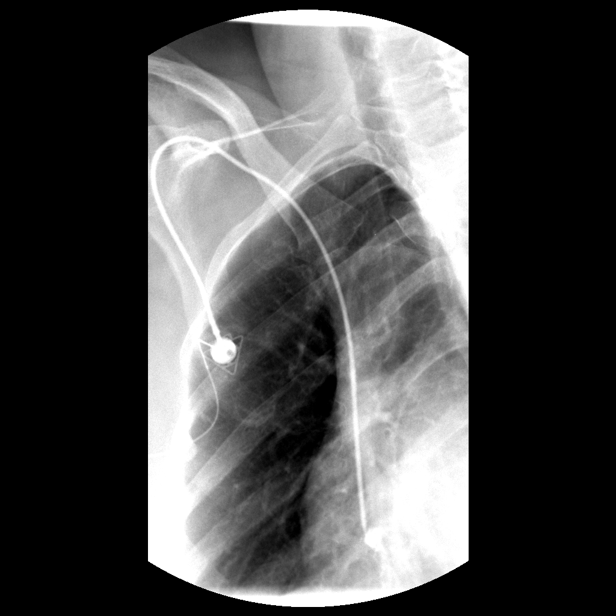
[im 9/13]
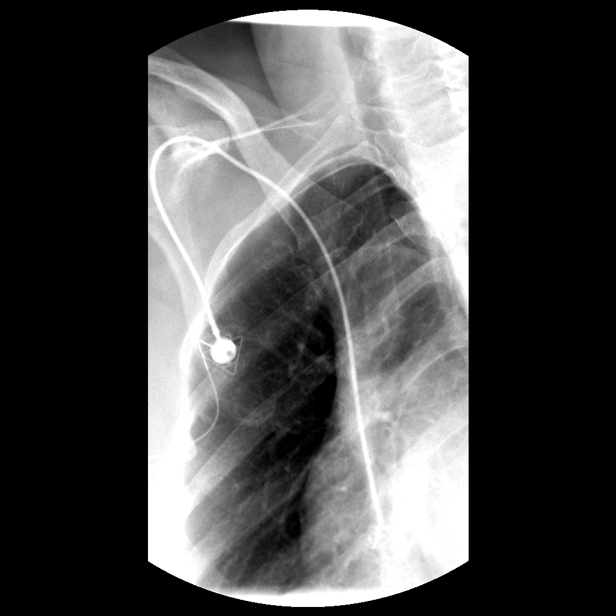
[im 13/13]
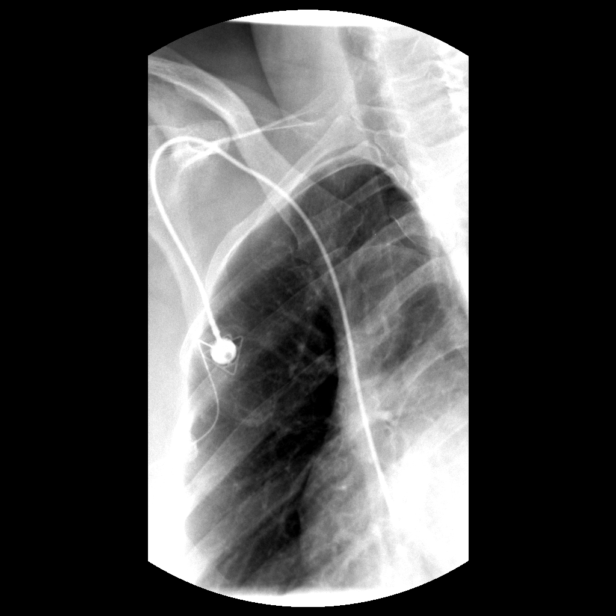

[15 of 16 positions shown; findings below may reference images not displayed]

FINDINGS: Right chest Port-A-Cath was injected with 20 ml Omnipaque-
300.  AP, RAO, and LAO projections were performed.

Port-A-Cath tip is in the right atrium.  Contrast injects easily.
No catheter fracture or misplacement is present.  There is no leak.
Catheter tip is in the right atrium. No thrombus is seen around the
catheter tip.
IMPRESSION: Normal Port-A-Cath injection.

## 2012-10-24 ENCOUNTER — Ambulatory Visit
Admission: RE | Admit: 2012-10-24 | Discharge: 2012-10-24 | Disposition: A | Discharge status: Home / Self Care | Payer: Commercial Managed Care - PPO | Source: Ambulatory Visit | Attending: Radiation Oncology | Admitting: Radiation Oncology

## 2012-10-24 ENCOUNTER — Ambulatory Visit: Payer: Commercial Managed Care - PPO

## 2012-10-25 ENCOUNTER — Ambulatory Visit: Payer: Commercial Managed Care - PPO

## 2012-10-25 ENCOUNTER — Ambulatory Visit
Admission: RE | Admit: 2012-10-25 | Discharge: 2012-10-25 | Disposition: A | Discharge status: Home / Self Care | Payer: Commercial Managed Care - PPO | Source: Ambulatory Visit | Attending: Radiation Oncology | Admitting: Radiation Oncology

## 2012-10-25 ENCOUNTER — Encounter: Payer: Self-pay | Admitting: Radiation Oncology

## 2012-10-25 VITALS — BP 110/76 | HR 91 | Temp 98.6°F | Resp 20 | Wt 162.8 lb

## 2012-10-25 DIAGNOSIS — C50519 Malignant neoplasm of lower-outer quadrant of unspecified female breast: Secondary | ICD-10-CM

## 2012-10-25 NOTE — Progress Notes (Signed)
Pt denies pain, fatigue, loss of appetite. Applying Radiaplex to tx area, Neosporin to small open area in left axilla, left chest wall.

## 2012-10-25 NOTE — Progress Notes (Signed)
Weekly Management Note Current Dose:  45 Gy  Projected Dose: 50.4 Gy   Narrative:  The patient presents for routine under treatment assessment.  CBCT/MVCT images/Port film x-rays were reviewed.  The chart was checked. Doing well. Using neosporin on her axilla and sclv. Grateful for her care.   Physical Findings: Weight: 162 lb 12.8 oz (73.846 kg). Unchanged. Irritation over left chest wall. Dry desquamation in axilla.  Impression:  The patient is tolerating radiation.  Plan:  Continue treatment as planned. Reviewed plan of care and update for number of treatments/changing fields/etc.

## 2012-10-26 ENCOUNTER — Ambulatory Visit
Admission: RE | Admit: 2012-10-26 | Discharge: 2012-10-26 | Disposition: A | Discharge status: Home / Self Care | Payer: Commercial Managed Care - PPO | Source: Ambulatory Visit | Attending: Radiation Oncology | Admitting: Radiation Oncology

## 2012-10-26 ENCOUNTER — Ambulatory Visit: Payer: Commercial Managed Care - PPO

## 2012-10-27 ENCOUNTER — Ambulatory Visit
Admission: RE | Admit: 2012-10-27 | Discharge: 2012-10-27 | Disposition: A | Discharge status: Home / Self Care | Payer: Commercial Managed Care - PPO | Source: Ambulatory Visit | Attending: Radiation Oncology | Admitting: Radiation Oncology

## 2012-10-27 ENCOUNTER — Ambulatory Visit: Payer: Commercial Managed Care - PPO

## 2012-10-28 ENCOUNTER — Ambulatory Visit: Payer: Commercial Managed Care - PPO

## 2012-10-28 ENCOUNTER — Ambulatory Visit
Admission: RE | Admit: 2012-10-28 | Discharge: 2012-10-28 | Disposition: A | Discharge status: Home / Self Care | Payer: Commercial Managed Care - PPO | Source: Ambulatory Visit | Attending: Radiation Oncology | Admitting: Radiation Oncology

## 2012-10-29 ENCOUNTER — Ambulatory Visit: Payer: Commercial Managed Care - PPO

## 2012-10-29 ENCOUNTER — Ambulatory Visit
Admission: RE | Admit: 2012-10-29 | Discharge: 2012-10-29 | Disposition: A | Discharge status: Home / Self Care | Payer: Commercial Managed Care - PPO | Source: Ambulatory Visit | Attending: Radiation Oncology | Admitting: Radiation Oncology

## 2012-10-29 ENCOUNTER — Encounter: Payer: Self-pay | Admitting: Radiation Oncology

## 2012-10-31 ENCOUNTER — Ambulatory Visit
Admission: RE | Admit: 2012-10-31 | Discharge: 2012-10-31 | Disposition: A | Discharge status: Home / Self Care | Payer: Commercial Managed Care - PPO | Source: Ambulatory Visit | Attending: Radiation Oncology | Admitting: Radiation Oncology

## 2012-10-31 ENCOUNTER — Ambulatory Visit: Payer: Commercial Managed Care - PPO

## 2012-11-01 ENCOUNTER — Ambulatory Visit
Admission: RE | Admit: 2012-11-01 | Discharge: 2012-11-01 | Disposition: A | Discharge status: Home / Self Care | Payer: Commercial Managed Care - PPO | Source: Ambulatory Visit | Attending: Radiation Oncology | Admitting: Radiation Oncology

## 2012-11-01 ENCOUNTER — Encounter: Payer: Self-pay | Admitting: Radiation Oncology

## 2012-11-01 ENCOUNTER — Ambulatory Visit: Payer: Commercial Managed Care - PPO

## 2012-11-01 VITALS — BP 115/85 | HR 125 | Resp 18 | Wt 161.0 lb

## 2012-11-01 DIAGNOSIS — C50519 Malignant neoplasm of lower-outer quadrant of unspecified female breast: Secondary | ICD-10-CM

## 2012-11-01 NOTE — Progress Notes (Signed)
Weekly Management Note Current Dose:  28.8  Gy  Projected Dose: 45 Gy   Narrative:  The patient presents for routine under treatment assessment.  CBCT/MVCT images/Port film x-rays were reviewed.  The chart was checked. I saw the patient prior to her next block of 5 for a PUT visit. She is having pain wil radiaplex and requested something else.  Physical Findings: Weight: 161 lb (73.029 kg).  She has dry desquamation in her neck and chest wall. Moist desquamation in the axilla.  Impression:  The patient is tolerating radiation.  Plan:  Continue treatment as planned. Off axilla with tangent fields so this should start healing. Continue neosporin.

## 2012-11-01 NOTE — Progress Notes (Signed)
Patient presents to the clinic today accompanied by her husband for a PUT with Dr. Michell Heinrich prior to treatment today since machine is behind. Patient alert and oriented to person, place, and time. No distress noted. Steady gait noted. Pleasant affect noted. Patient denies pain at this time. Patient reports discomfort related to skin irration of left chest wall from radiation therapy. Hyperpigmentation of left chest wall and left anterior neck with desquamation noted. Patient reports that she continues to use radiaplex but, it burns in certain areas. Transitioned patient to biafine cream and directed upon use. Patient verbalized understanding. Patient reports energy level has not decreased since beginning treatment. Reported all findings to Dr. Michell Heinrich.

## 2012-11-02 ENCOUNTER — Ambulatory Visit
Admission: RE | Admit: 2012-11-02 | Discharge: 2012-11-02 | Disposition: A | Discharge status: Home / Self Care | Payer: Commercial Managed Care - PPO | Source: Ambulatory Visit | Attending: Radiation Oncology | Admitting: Radiation Oncology

## 2012-11-02 ENCOUNTER — Ambulatory Visit: Payer: Commercial Managed Care - PPO

## 2012-11-02 MED ORDER — BIAFINE EX EMUL
Freq: Every day | CUTANEOUS | Status: DC
  Administered 2012-11-02: 10:00:00 via TOPICAL

## 2012-11-02 NOTE — Addendum Note (Signed)
Encounter addended by: Agnes Lawrence, RN on: 11/02/2012 10:15 AM<BR>     Documentation filed: Inpatient MAR, Orders

## 2012-11-07 ENCOUNTER — Ambulatory Visit: Payer: Commercial Managed Care - PPO

## 2012-11-07 ENCOUNTER — Ambulatory Visit
Admission: RE | Admit: 2012-11-07 | Discharge: 2012-11-07 | Disposition: A | Discharge status: Home / Self Care | Payer: Commercial Managed Care - PPO | Source: Ambulatory Visit | Attending: Radiation Oncology | Admitting: Radiation Oncology

## 2012-11-08 ENCOUNTER — Ambulatory Visit
Admission: RE | Admit: 2012-11-08 | Discharge: 2012-11-08 | Disposition: A | Discharge status: Home / Self Care | Payer: Commercial Managed Care - PPO | Source: Ambulatory Visit | Attending: Radiation Oncology | Admitting: Radiation Oncology

## 2012-11-08 ENCOUNTER — Ambulatory Visit: Payer: Commercial Managed Care - PPO

## 2012-11-08 ENCOUNTER — Encounter: Payer: Self-pay | Admitting: Radiation Oncology

## 2012-11-08 VITALS — BP 135/93 | HR 88 | Resp 18 | Wt 160.9 lb

## 2012-11-08 DIAGNOSIS — C50519 Malignant neoplasm of lower-outer quadrant of unspecified female breast: Secondary | ICD-10-CM

## 2012-11-08 NOTE — Progress Notes (Signed)
Patient presents to the clinic today accompanied by her husband for PUT with Dr. Michell Heinrich. Patient is alert and oriented to person, place, and time. No distress noted. Steady gait noted. Pleasant affect noted. Patient denies pain at this time. Patient reports discomfort related to skin changes on left chest wall, axilla and supraclav. Hyperpigmentation with desquamation noted left chest wall, axilla, and supraclav. Patient reports using hydrocortisone, biafine, and neosporin to these areas of breakdown. Patient reports energy level is unchanged from last week. Reported all findings to Dr. Michell Heinrich.

## 2012-11-08 NOTE — Progress Notes (Signed)
Weekly Management Note Current Dose: 50.4  Gy  Projected Dose: 50.4 Gy   Narrative:  The patient presents for routine under treatment assessment.  CBCT/MVCT images/Port film x-rays were reviewed.  The chart was checked. Continues treatment to her PAB and anterior axillary boost. IM node treatment field is itchy and red. Using biafene, hydrocortisone and neosporin.   Physical Findings: Weight: 160 lb 14.4 oz (72.984 kg). Dry desquamation over right supraclavicular fossa, left SCLV is almost healed. Healing skin in axilla.  Impression:  The patient is tolerating radiation.  Plan:  Continue treatment as planned. Mark electron boost and ok to treat along with PAB and ant axilla boost. Stop IM node fields.

## 2012-11-09 ENCOUNTER — Ambulatory Visit
Admission: RE | Admit: 2012-11-09 | Discharge: 2012-11-09 | Disposition: A | Discharge status: Home / Self Care | Payer: Commercial Managed Care - PPO | Source: Ambulatory Visit | Attending: Radiation Oncology | Admitting: Radiation Oncology

## 2012-11-09 ENCOUNTER — Ambulatory Visit: Payer: Commercial Managed Care - PPO

## 2012-11-10 ENCOUNTER — Ambulatory Visit
Admission: RE | Admit: 2012-11-10 | Discharge: 2012-11-10 | Disposition: A | Discharge status: Home / Self Care | Payer: Commercial Managed Care - PPO | Source: Ambulatory Visit | Attending: Radiation Oncology | Admitting: Radiation Oncology

## 2012-11-10 ENCOUNTER — Ambulatory Visit: Payer: Commercial Managed Care - PPO

## 2012-11-11 ENCOUNTER — Ambulatory Visit
Admission: RE | Admit: 2012-11-11 | Discharge: 2012-11-11 | Disposition: A | Discharge status: Home / Self Care | Payer: Commercial Managed Care - PPO | Source: Ambulatory Visit | Attending: Radiation Oncology | Admitting: Radiation Oncology

## 2012-11-14 ENCOUNTER — Ambulatory Visit
Admission: RE | Admit: 2012-11-14 | Discharge: 2012-11-14 | Disposition: A | Discharge status: Home / Self Care | Payer: Commercial Managed Care - PPO | Source: Ambulatory Visit | Attending: Radiation Oncology | Admitting: Radiation Oncology

## 2012-11-15 ENCOUNTER — Encounter: Payer: Self-pay | Admitting: Radiation Oncology

## 2012-11-15 ENCOUNTER — Ambulatory Visit
Admission: RE | Admit: 2012-11-15 | Discharge: 2012-11-15 | Disposition: A | Discharge status: Home / Self Care | Payer: Commercial Managed Care - PPO | Source: Ambulatory Visit | Attending: Radiation Oncology | Admitting: Radiation Oncology

## 2012-11-15 VITALS — BP 117/83 | HR 97 | Resp 18 | Wt 159.5 lb

## 2012-11-15 DIAGNOSIS — C50519 Malignant neoplasm of lower-outer quadrant of unspecified female breast: Secondary | ICD-10-CM

## 2012-11-15 NOTE — Progress Notes (Signed)
Patient presents to the clinic today accompanied by her husband for a PUT with Dr. Michell Heinrich. Patient alert and oriented to person, place, and time. No distress noted. Steady gait noted. Pleasant affect noted. Patient denies pain at this time. Hyperpigmentation of left chest wall noted with dry desquamation. Area of breakdown at left clavicle much improved. Patient reports using Biafine bid as directed. Patient went to the gym and walked on the treadmill for one hour. Reported all findings to Dr. Michell Heinrich.

## 2012-11-15 NOTE — Progress Notes (Signed)
Weekly Management Note Current Dose:  60.4 Gy  Projected Dose: 60.4 Gy   Narrative:  The patient presents for routine under treatment assessment.  CBCT/MVCT images/Port film x-rays were reviewed.  The chart was checked. Finishing PAB fields now. Skin has healed under arm and sclv fossa. Checked scar on treatment machine.   Physical Findings: Weight: 159 lb 8 oz (72.349 kg). All skin is healing.  Some dry desquamation medially.   Impression:  The patient is tolerating radiation.  Plan:  Continue treatment as planned.Continue biafene x 2 weeks then lotion with vit e.

## 2012-11-16 ENCOUNTER — Ambulatory Visit
Admission: RE | Admit: 2012-11-16 | Discharge: 2012-11-16 | Disposition: A | Discharge status: Home / Self Care | Payer: Commercial Managed Care - PPO | Source: Ambulatory Visit | Attending: Radiation Oncology | Admitting: Radiation Oncology

## 2012-11-17 ENCOUNTER — Ambulatory Visit
Admission: RE | Admit: 2012-11-17 | Discharge: 2012-11-17 | Disposition: A | Discharge status: Home / Self Care | Payer: Commercial Managed Care - PPO | Source: Ambulatory Visit | Attending: Radiation Oncology | Admitting: Radiation Oncology

## 2012-11-17 ENCOUNTER — Encounter: Payer: Self-pay | Admitting: Radiation Oncology

## 2012-11-20 NOTE — Progress Notes (Signed)
°  Radiation Oncology         [phone] ________________________________  Name: Sydney Cervantes MRN: [id number]  Date: 11/17/2012  DOB: September 21, 1961  End of Treatment Note  Diagnosis: Stage IV ( metastatitic to contralateral supraclavicular lymph nodes and positive ipsilateral internal mammary lymph nodes) left breast cancer     Indication for treatment:  Curative       Radiation treatment dates:   09/21/2012-11/17/2012  Site/dose:   Right supraclavicular fossa / 50.4 Gray @ 1.8 Gy per fraction x 28 fractions Left Chest Wall / 50.4 Gy @ 1.8 Gy per fraction x 28 fractions Left SCLV fossa / 50.4 Gy @ 1.8 per fraction x 28 fractions Left PAB/mid axilla / 45 Gy @ 1.8 Gy per fraction x 25 fractions Left IM nodes / 50.4 Gy @ 1.8 per fraction x 28 fractions L chest wall scar boost /10 Gy at 2 Gy per fraction x 5 fractions  Beams/energy:  LAO/ 6 MV photons Forward planned opposed tangents / 6 MV photons RAO / 6 MV photons LPO / 6 MV photons RAO / 6 MV photons and 12 MeV eelctrons En Face / 9 MeV electrons  Narrative: The patient tolerated radiation treatment relatively well.   After 2 weeks of treatment, reanalysis of her anatomy allowed the addition of treatment to her IM nodes and axilla. We were able to bring the total axillary dose to 45 Gy and the total dose to the IM nodes to between 45-50.4 Gy. She had the expected moist desquamation in the right and left supraclavicular fossa and dry desquamation over the chest wall. She was healing well as she finished the rest of her treatment.   Plan: The patient has completed radiation treatment. The patient will return to radiation oncology clinic for routine followup in one month. I advised them to call or return sooner if they have any questions or concerns related to their recovery or treatment.  ------------------------------------------------  Lurline Hare, MD

## 2012-11-20 NOTE — Progress Notes (Signed)
Name: Sydney Cervantes   MRN: [id number]  Date:  11/08/2012   DOB: 11/09/1961  Status:outpatient    DIAGNOSIS: Breast cancer.  CONSENT VERIFIED: yes   SET UP: Patient is setup supine   IMMOBILIZATION:  The following immobilization was used:Custom Moldable Pillow, breast board.   NARRATIVE: Sydney Cervantes underwent complex simulation and treatment planning for her scar boost treatment today.  Her scar was outlined on the treatment machine and depth to the chest wall measured on CT scan.   9  MeV electrons will be prescribed to the 92% isodose line.   A block will be used for beam modification purposes.  A special port plan is requested.

## 2012-12-14 ENCOUNTER — Other Ambulatory Visit: Payer: Commercial Managed Care - PPO | Admitting: Lab

## 2012-12-16 ENCOUNTER — Ambulatory Visit
Admission: RE | Admit: 2012-12-16 | Discharge: 2012-12-16 | Disposition: A | Discharge status: Home / Self Care | Payer: Commercial Managed Care - PPO | Source: Ambulatory Visit | Attending: Radiation Oncology | Admitting: Radiation Oncology

## 2012-12-16 ENCOUNTER — Encounter: Payer: Self-pay | Admitting: Radiation Oncology

## 2012-12-16 ENCOUNTER — Encounter: Payer: Self-pay | Admitting: *Deleted

## 2012-12-16 VITALS — BP 122/77 | HR 94 | Temp 97.0°F | Resp 18 | Wt 162.1 lb

## 2012-12-16 DIAGNOSIS — J302 Other seasonal allergic rhinitis: Secondary | ICD-10-CM | POA: Insufficient documentation

## 2012-12-16 DIAGNOSIS — Z923 Personal history of irradiation: Secondary | ICD-10-CM | POA: Insufficient documentation

## 2012-12-16 DIAGNOSIS — K219 Gastro-esophageal reflux disease without esophagitis: Secondary | ICD-10-CM | POA: Insufficient documentation

## 2012-12-16 DIAGNOSIS — D649 Anemia, unspecified: Secondary | ICD-10-CM | POA: Insufficient documentation

## 2012-12-16 DIAGNOSIS — Z973 Presence of spectacles and contact lenses: Secondary | ICD-10-CM | POA: Insufficient documentation

## 2012-12-16 DIAGNOSIS — C50919 Malignant neoplasm of unspecified site of unspecified female breast: Secondary | ICD-10-CM | POA: Insufficient documentation

## 2012-12-16 DIAGNOSIS — C50519 Malignant neoplasm of lower-outer quadrant of unspecified female breast: Secondary | ICD-10-CM

## 2012-12-16 NOTE — Progress Notes (Signed)
Patient presents to the clinic today accompanied by her husband for a follow up appointment with Dr. Michell Heinrich. Patient alert and oriented to person, place, and time. No distress noted. Steady gait noted. Pleasant affect noted. Patient denies pain at this time. Patient reports that her energy level is slowly improving. Patient reports hyperpigmentation of treatment field but, denies desquamation. Patient reports the skin is "sloffing off." Patient reports that she continues to use Biafine daily. Patient reports taking tamoxifen as directed. Patient denies hot flashes or night sweats. Reported all findings to Dr. Michell Heinrich.

## 2012-12-16 NOTE — Progress Notes (Signed)
   Department of Radiation Oncology  Phone:  [phone] Fax:        [phone]   Name: Sydney Cervantes   DOB: [birth year]  MRN: [id number]    Date: 12/16/2012  Follow Up Visit Note  Diagnosis: Stage IV (contralateral supraclavicular LN) Left breast cancer  Interval since last radiation: 1 month  Interval History: Sydney Cervantes presents today for routine followup.  He is feeling well and doing well. Her skin is healed up well. She continues to have mild hyperpigmentation. She is to have her port flushed. She has not been rescheduled to see another provider after Dr. Renelda Loma departure. She is tolerating her tamoxifen well without any significant side effects. She has no new bone pain or headache. She is a little but frustrated by the "chemotherapy brain" which leads to difficulties with word finding. He is back at the gym and walking and slowly increasing her exercise levels.  Allergies:  Allergies  Allergen Reactions  . Adhesive (Tape) Itching and Rash    Rash and itching at steristrip and EKG lead pad sites    Medications:  Current Outpatient Prescriptions  Medication Sig Dispense Refill  . acetaminophen (TYLENOL) 325 MG tablet Take 650 mg by mouth every 6 (six) hours as needed.      . Calcium Carbonate-Vitamin D (CALTRATE 600+D PO) Take by mouth.      . emollient (BIAFINE) cream Apply topically as needed.      Marland Kitchen ibuprofen (ADVIL,MOTRIN) 200 MG tablet Take 200 mg by mouth every 6 (six) hours as needed.      . lidocaine-prilocaine (EMLA) cream Apply 1 application topically as needed. Prior to any blood draws or testing through port.      . tamoxifen (NOLVADEX) 20 MG tablet Take 1 tablet (20 mg total) by mouth daily.  30 tablet  11  . Wound Cleansers (RADIAPLEX EX) Apply topically.       No current facility-administered medications for this encounter.   Facility-Administered Medications Ordered in Other Encounters  Medication Dose Route Frequency Provider Last Rate Last Dose  .  topical emolient (BIAFINE) emulsion   Topical Daily Sydney Hare, MD        Physical Exam:   weight is 162 lb 1.6 oz (73.528 kg). Her oral temperature is 97 F (36.1 C). Her blood pressure is 122/77 and her pulse is 94. Her respiration is 18.  Shows hyperpigmentation of the bilateral supraclavicular fossa as well as the left chest wall. No visible or palpable evidence of tumor recurrence  IMPRESSION: Sydney Cervantes is a 52 y.o. female status post radical radiotherapy to the left chest wall left axilla and left supraclavicular fossa left internal mammary lymph nodes and right supraclavicular fossa as well as the drain sites and a boost to the scar with resolving acute effects of treatment  PLAN:  Sydney Cervantes looks great. We discussed scheduling her port flush as well as a followup with medical oncology. I will see her back in 3 months just to make sure she stuck to and from a oncology standpoint. I will leave the decision whether to scan her with CT scans her PET scan and to her treating oncologist.    Sydney Hare, MD

## 2012-12-20 ENCOUNTER — Other Ambulatory Visit: Payer: Commercial Managed Care - PPO | Admitting: Lab

## 2012-12-20 ENCOUNTER — Ambulatory Visit: Payer: Commercial Managed Care - PPO | Admitting: Oncology

## 2012-12-21 ENCOUNTER — Telehealth: Payer: Self-pay | Admitting: Radiation Oncology

## 2012-12-21 ENCOUNTER — Other Ambulatory Visit: Payer: Self-pay | Admitting: Oncology

## 2012-12-21 DIAGNOSIS — C50519 Malignant neoplasm of lower-outer quadrant of unspecified female breast: Secondary | ICD-10-CM

## 2012-12-21 NOTE — Telephone Encounter (Signed)
Returned patient's call as requested. Ensured she is aware of flush appointment on Monday. Patient confirmed appointment. Patient verbalized understanding of all discussed. Routed message to Dr. Michell Heinrich.

## 2012-12-26 ENCOUNTER — Other Ambulatory Visit: Payer: Self-pay | Admitting: Oncology

## 2012-12-26 ENCOUNTER — Telehealth: Payer: Self-pay | Admitting: Oncology

## 2012-12-26 ENCOUNTER — Ambulatory Visit: Payer: Commercial Managed Care - PPO

## 2012-12-26 ENCOUNTER — Ambulatory Visit (HOSPITAL_BASED_OUTPATIENT_CLINIC_OR_DEPARTMENT_OTHER): Payer: Commercial Managed Care - PPO | Admitting: Gynecologic Oncology

## 2012-12-26 ENCOUNTER — Other Ambulatory Visit (HOSPITAL_BASED_OUTPATIENT_CLINIC_OR_DEPARTMENT_OTHER): Payer: Commercial Managed Care - PPO | Admitting: Lab

## 2012-12-26 VITALS — BP 112/79 | HR 90 | Temp 97.6°F | Resp 20 | Ht 68.0 in | Wt 159.8 lb

## 2012-12-26 DIAGNOSIS — Z170 Estrogen receptor positive status [ER+]: Secondary | ICD-10-CM

## 2012-12-26 DIAGNOSIS — C50919 Malignant neoplasm of unspecified site of unspecified female breast: Secondary | ICD-10-CM

## 2012-12-26 DIAGNOSIS — C50519 Malignant neoplasm of lower-outer quadrant of unspecified female breast: Secondary | ICD-10-CM

## 2012-12-26 DIAGNOSIS — C773 Secondary and unspecified malignant neoplasm of axilla and upper limb lymph nodes: Secondary | ICD-10-CM

## 2012-12-26 LAB — CBC WITH DIFFERENTIAL/PLATELET
BASO%: 0.6 % (ref 0.0–2.0)
Basophils Absolute: 0 10*3/uL (ref 0.0–0.1)
HCT: 39.3 % (ref 34.8–46.6)
HGB: 13.5 g/dL (ref 11.6–15.9)
LYMPH%: 17.7 % (ref 14.0–49.7)
MCH: 32.3 pg (ref 25.1–34.0)
MCHC: 34.3 g/dL (ref 31.5–36.0)
MONO#: 0.4 10*3/uL (ref 0.1–0.9)
NEUT%: 63.9 % (ref 38.4–76.8)
Platelets: 151 10*3/uL (ref 145–400)
WBC: 3.6 10*3/uL — ABNORMAL LOW (ref 3.9–10.3)
lymph#: 0.6 10*3/uL — ABNORMAL LOW (ref 0.9–3.3)

## 2012-12-26 LAB — COMPREHENSIVE METABOLIC PANEL (CC13)
BUN: 17 mg/dL (ref 7.0–26.0)
CO2: 27 mEq/L (ref 22–29)
Calcium: 9.4 mg/dL (ref 8.4–10.4)
Chloride: 108 mEq/L — ABNORMAL HIGH (ref 98–107)
Creatinine: 0.9 mg/dL (ref 0.6–1.1)
Total Bilirubin: 0.45 mg/dL (ref 0.20–1.20)

## 2012-12-26 MED ORDER — HEPARIN SOD (PORK) LOCK FLUSH 100 UNIT/ML IV SOLN
500.0000 [IU] | Freq: Once | INTRAVENOUS | Status: AC
  Administered 2012-12-26: 500 [IU] via INTRAVENOUS
  Filled 2012-12-26: qty 5

## 2012-12-26 MED ORDER — SODIUM CHLORIDE 0.9 % IJ SOLN
10.0000 mL | INTRAMUSCULAR | Status: DC | PRN
  Administered 2012-12-26: 10 mL via INTRAVENOUS
  Filled 2012-12-26: qty 10

## 2012-12-26 NOTE — Telephone Encounter (Signed)
gv pt appt schedule for April and sent message to Dr. Allene Pyo nurse Huntley Dec @ [facility] requesting appt for port removal. [facility] will contact pt - pt awaare and will call to schedule flush if port not removed in 6wks.

## 2012-12-26 NOTE — Progress Notes (Signed)
ID: Sydney Cervantes   DOB: 07-14-1961  MR#: [id number]  WUJ#:[id number]  GYN:  Debbora Dus, NP  SU:  Dr. Ovidio Kin  OTHER MD:  HISTORY OF PRESENT ILLNESS:  Sydney Cervantes is a 52 year old [place] woman with:  1. 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  ER/PR positive at 63/5% respectively, HER-2 negative, elevated Ki-67. Completed 4/6 planned neoadjuvant dose dense FEC with Neulasta support on day 2. 20% dose reduction on 5-FU, 50% dose reduction on epirubicin, 40% dose reduction on Cytoxan, after one-week delay due to anemia/thrombocytopenia/and region of probable localized thrombophlebitis versus mild chemotherapy extravasation on the left forearm, covered with Keflex 500 mg by mouth 3 times a day. Switched to single agent neoadjuvant Taxol, given 3 weeks on 1 week off, completed 4 cycles on 08/02/2012.  2. Status post left modified radical mastectomy on 08/25/2012 with final pathology showing no tumor in the primary tumor bed with 7 of 8 involved lymph nodes. The final pathology showed an ER positive tumor, HER-2 negative.  She completed radiation on 11/18/12 and started taking Tamoxifen on 11/21/2012.  It is also noted that she is BRCA negative.   INTERVAL HISTORY:  She presents today for continued follow up.  She has been taking Tamoxifen 20 mg daily since 11/21/2012.  She reports one to two hot flashes a day that are tolerable.  She would like to have her port a cath removed if cleared by Dr. Darnelle Catalan.  No complaints voiced since last visit.  She reports that she tolerated the radiation well with minimal skin peeling.  She denies vaginal discharge.     REVIEW OF SYSTEMS: Constitutional: Feels well.  Cardiovascular: No chest pain, shortness of breath, or edema.  Pulmonary: No cough or wheeze.  Gastrointestinal: No nausea, vomiting, or diarrhea. No bright red blood per rectum  or change in bowel movement.  Genitourinary: No frequency, urgency, or dysuria. No vaginal bleeding or discharge.  Musculoskeletal: No myalgia or joint pain Neurologic: No weakness, numbness, or change in gait.  Psychology: No depression, anxiety, or insomnia  PAST MEDICAL HISTORY: Past Medical History  Diagnosis Date  . Hx of colonoscopy 2000  . Wears glasses   . Seasonal allergies     "when molds and things come out; pollens" (08/25/2012)  . Breast cancer     left  . Anemia     "chemo induced" (08/25/2012)  . GERD (gastroesophageal reflux disease)     occ; "side effect of chemo" (08/25/2012)  . Hx of radiation therapy 09/21/12 -11/17/12    R supraclav fossa, L chest wall/SCVL fossa/PAB mid axilla/IM nodes/scar boost    PAST SURGICAL HISTORY: Past Surgical History  Procedure Date  . 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 Portacath placement 02/11/2012    Procedure: INSERTION PORT-A-CATH;  Surgeon: Kandis Cocking, MD;  Location: [facility] OR;  Service: General;  Laterality: Right;  Subclavian  . Appendectomy 1987  . Cholecystectomy 2003  . Dilation and curettage of uterus 1992  . Mastectomy modified radical 08/25/2012    left  . Breast biopsy 01/2012    left    FAMILY HISTORY Family History  Problem Relation Age of Onset  . Breast cancer Mother   . Breast cancer Paternal Aunt   . Cancer Maternal Grandmother     Hodgkins Disease    GYNECOLOGIC HISTORY:  G3P2.  Menarche at  52 years of age.  Followed by Debbora Dus.    SOCIAL HISTORY:  Married with 2 children.  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   HEALTH MAINTENANCE: History  Substance Use Topics  . Smoking status: Never Smoker   . Smokeless tobacco: Never Used  . Alcohol Use: 0.6 oz/week    1 Glasses of wine per week     Comment: 08/25/2012 "used to have 5 glasses wine/wk; not anymore"    Allergies  Allergen Reactions  . Adhesive (Tape) Itching and  Rash    Rash and itching at steristrip and EKG lead pad sites    Current Outpatient Prescriptions  Medication Sig Dispense Refill  . acetaminophen (TYLENOL) 325 MG tablet Take 650 mg by mouth every 6 (six) hours as needed.      . Calcium Carbonate-Vitamin D (CALTRATE 600+D PO) Take by mouth daily.       . tamoxifen (NOLVADEX) 20 MG tablet Take 1 tablet (20 mg total) by mouth daily.  30 tablet  11  . emollient (BIAFINE) cream Apply topically as needed.      Marland Kitchen ibuprofen (ADVIL,MOTRIN) 200 MG tablet Take 200 mg by mouth every 6 (six) hours as needed.      . lidocaine-prilocaine (EMLA) cream Apply 1 application topically as needed. Prior to any blood draws or testing through port.      . Wound Cleansers (RADIAPLEX EX) Apply topically.       No current facility-administered medications for this visit.   Facility-Administered Medications Ordered in Other Visits  Medication Dose Route Frequency Provider Last Rate Last Dose  . sodium chloride 0.9 % injection 10 mL  10 mL Intravenous PRN Lowella Dell, MD   10 mL at 12/26/12 1429  . topical emolient (BIAFINE) emulsion   Topical Daily Lurline Hare, MD        OBJECTIVE: Filed Vitals:   12/26/12 1322  BP: 112/79  Pulse: 90  Temp: 97.6 F (36.4 C)  Resp: 20     Body mass index is 24.30 kg/(m^2).    ECOG FS: Mildly symptomatic but ambulatory  General: Well developed, well nourished female in no acute distress. Alert and oriented x 3.  Head/Neck: Oropharynx clear.  Sclerae anicteric.  Supple without any enlargements.  Lymph node survey: No cervical, supraclavicular, or inguinal adenopathy  Cardiovascular: Regular rate and rhythm. S1 and S2 normal.  Lungs: Clear to auscultation bilaterally. No wheezes/crackles/rhonchi noted.  MSK:  No focal spinal tenderness. Skin: No rashes or lesions present. Back: No CVA tenderness  Abdomen: Abdomen soft, non-tender and obese. Active bowel sounds in all quadrants. No evidence of a fluid wave or  abdominal masses.  Breast:  Left mastectomy scar well healed with hyperpigmentation of the left chest wall improving.  No erythema, masses, or drainage noted.  Right breast unremarkable without masses, erythema, or drainage.  No lymphadenopathy noted.      Extremities: No bilateral cyanosis, edema, or clubbing.     LAB RESULTS: Lab Results  Component Value Date   WBC 3.6* 12/26/2012   NEUTROABS 2.3 12/26/2012   HGB 13.5 12/26/2012   HCT 39.3 12/26/2012   MCV 94.0 12/26/2012   PLT 151 12/26/2012      Chemistry      Component Value Date/Time   NA 144 12/26/2012 1235   NA 142 08/19/2012 1506   K 4.2 12/26/2012 1235   K 4.3 08/19/2012 1506   CL 108* 12/26/2012 1235  CL 106 08/19/2012 1506   CO2 27 12/26/2012 1235   CO2 26 08/19/2012 1506   BUN 17.0 12/26/2012 1235   BUN 10 08/19/2012 1506   CREATININE 0.9 12/26/2012 1235   CREATININE 0.78 08/25/2012 1336      Component Value Date/Time   CALCIUM 9.4 12/26/2012 1235   CALCIUM 9.5 08/19/2012 1506   ALKPHOS 95 12/26/2012 1235   ALKPHOS 62 06/21/2012 1259   AST 27 12/26/2012 1235   AST 42* 06/21/2012 1259   ALT 28 12/26/2012 1235   ALT 45* 06/21/2012 1259   BILITOT 0.45 12/26/2012 1235   BILITOT 0.5 06/21/2012 1259       Lab Results  Component Value Date   LABCA2 56* 03/08/2012    No components found with this basename: [id number]    No results found for this basename: INR:1;PROTIME:1 in the last 168 hours  Urinalysis    Component Value Date/Time   LABSPEC 1.010 03/16/2012 1248    STUDIES: No results found.  ASSESSMENT:  #40. 52 year old female with locally advanced, clinical stage T4, N3, infiltrating lobular carcinoma of the left breast with lymph node involvement and diffuse skin thickening.  ER/PR positive at 63/5% respectively, HER-2 negative, elevated Ki-67. Completed 4/6 planned neoadjuvant dose dense FEC with Neulasta support on day 2. 20% dose reduction on 5-FU, 50% dose reduction on epirubicin, 40% dose reduction on Cytoxan, after  one-week delay due to anemia/thrombocytopenia/and region of probable localized thrombophlebitis versus mild chemotherapy extravasation on the left forearm, covered with Keflex 500 mg by mouth 3 times a day. Switched to single agent neoadjuvant Taxol, given 3 weeks on 1 week off, completed 4 cycles on 08/02/2012.   #2 Status post left modified radical mastectomy on 08/25/2012 with final pathology showing no tumor in the primary tumor bed with 7 of 8 involved lymph nodes. The final pathology showed an ER positive tumor, HER-2 negative.  She completed radiation on 11/18/12 and started taking Tamoxifen on 11/21/2012.  It is also noted that she is BRCA negative.    PLAN: She is to see Dr. Darnelle Catalan in three months for continued follow up.  She has been instructed to continue taking Tamoxifen 20 mg daily.  She has been advised to monitor for side effects of Tamoxifen since it takes several months to achieve a therapeutic level.  She is to follow up with Dr. Ezzard Standing at [facility] to arrange port removal.  She has been instructed to contact the office for any questions or concerns.    CROSS, MELISSA DEAL    12/26/2012

## 2012-12-26 NOTE — Patient Instructions (Addendum)
Doing well.  Continue Tamoxifen and follow up with Dr. Darnelle Catalan in 3 months.  See Dr. Ezzard Standing about port removal.  Call for any new symptoms or for any questions or concerns.

## 2012-12-27 ENCOUNTER — Encounter: Payer: Self-pay | Admitting: Gynecologic Oncology

## 2012-12-28 ENCOUNTER — Telehealth (INDEPENDENT_AMBULATORY_CARE_PROVIDER_SITE_OTHER): Payer: Self-pay

## 2012-12-28 NOTE — Telephone Encounter (Signed)
V/M appt 01/05/13@350pm  Advised to call if not a good time

## 2013-01-03 ENCOUNTER — Ambulatory Visit: Payer: Commercial Managed Care - PPO | Admitting: Oncology

## 2013-01-03 ENCOUNTER — Other Ambulatory Visit: Payer: Commercial Managed Care - PPO | Admitting: Lab

## 2013-01-05 ENCOUNTER — Encounter (INDEPENDENT_AMBULATORY_CARE_PROVIDER_SITE_OTHER): Payer: Self-pay | Admitting: Surgery

## 2013-01-05 ENCOUNTER — Ambulatory Visit (INDEPENDENT_AMBULATORY_CARE_PROVIDER_SITE_OTHER): Payer: Commercial Managed Care - PPO | Admitting: Surgery

## 2013-01-05 ENCOUNTER — Telehealth (INDEPENDENT_AMBULATORY_CARE_PROVIDER_SITE_OTHER): Payer: Self-pay | Admitting: General Surgery

## 2013-01-05 ENCOUNTER — Other Ambulatory Visit (INDEPENDENT_AMBULATORY_CARE_PROVIDER_SITE_OTHER): Payer: Self-pay

## 2013-01-05 VITALS — BP 124/78 | HR 84 | Resp 16 | Ht 68.0 in | Wt 159.4 lb

## 2013-01-05 DIAGNOSIS — C50919 Malignant neoplasm of unspecified site of unspecified female breast: Secondary | ICD-10-CM

## 2013-01-05 DIAGNOSIS — C50519 Malignant neoplasm of lower-outer quadrant of unspecified female breast: Secondary | ICD-10-CM

## 2013-01-05 NOTE — Progress Notes (Addendum)
Re:   Sydney Cervantes DOB:   05-31-1961 MRN:   [id number]  [facility]  ASSESSMENT AND PLAN: 1.  Left breast cancer.  T4, N3, Mx, at 11 o'clock.  Original [place] - 4.5 cm mass, MRI - 7.6 cm (01/19/2012) mass extending to pectoralis muscle.  Lobular, grade 3.  ER - 62%, PR - 5%, Her2Neu - neg., Ki67 - 33  Treating oncology - Magrinat (she did see Rubin)/[facility].  Neoadjuvant chemotx  finished  08/02/2012.  Left MRM - 08/25/2012 - D. Yobana Culliton  Final Path - no residual cancer in the breast, but 7/8 nodes positive.  She finished radiation tx in December 2013  We discussed removing her power port and setting her up for plastic surgery consult.  I will otherwise see her in 6 months.   2.  Level 1, 2 and 3 nodes positive - by MRI. 3.  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  4.  Power port - 02/11/2012.  Discussed removal.  To be done as outpatient at the earliest convenient time. 5.  BRCA 1/2 neg - April 2013.  CancerNext panel (14 genes) was negative.  07/14/2012 6.  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   REFERRING PHYSICIAN:  Dag Pavic, [facility].  HISTORY OF PRESENT ILLNESS: Sydney Cervantes is a 52 y.o. (DOB: 05-15-1961)  whtie female whose primary care physician is Debbora Dus, NP, [facility], and comes to me today for follow up of left breast cancer and left MRM which I did 08/25/2012.  Husband is with her.  She is doing well.  She has completed radiation therapy.  She is interested in getting her power port out.  And she is interested in reconstruction.  She is now followed by Dr. Darnelle Catalan.  Breast family history: Her mother had breast cancer in her 80's.  She then had a recurrence.  She is still living.      Past Medical History  Diagnosis Date  . Hx of colonoscopy 2000  . Wears glasses   . Seasonal allergies     "when molds and things come out; pollens" (08/25/2012)  . Breast cancer     left  . Anemia     "chemo induced" (08/25/2012)  . GERD (gastroesophageal  reflux disease)     occ; "side effect of chemo" (08/25/2012)  . Hx of radiation therapy 09/21/12 -11/17/12    R supraclav fossa, L chest wall/SCVL fossa/PAB mid axilla/IM nodes/scar boost     Current Outpatient Prescriptions  Medication Sig Dispense Refill  . Calcium Carbonate-Vitamin D (CALTRATE 600+D PO) Take by mouth daily.       . tamoxifen (NOLVADEX) 20 MG tablet Take 1 tablet (20 mg total) by mouth daily.  30 tablet  11  . Wound Cleansers (RADIAPLEX EX) Apply topically.      Marland Kitchen acetaminophen (TYLENOL) 325 MG tablet Take 650 mg by mouth every 6 (six) hours as needed.      Marland Kitchen emollient (BIAFINE) cream Apply topically as needed.      Marland Kitchen ibuprofen (ADVIL,MOTRIN) 200 MG tablet Take 200 mg by mouth every 6 (six) hours as needed.      . lidocaine-prilocaine (EMLA) cream Apply 1 application topically as needed. Prior to any blood draws or testing through port.       No current facility-administered medications for this visit.   Facility-Administered Medications Ordered in Other Visits  Medication Dose Route Frequency Provider Last Rate Last Dose  . topical emolient (  BIAFINE) emulsion   Topical Daily Lurline Hare, MD         Allergies  Allergen Reactions  . Adhesive (Tape) Itching and Rash    Rash and itching at steristrip and EKG lead pad sites    REVIEW OF SYSTEMS:  Gastrointestinal: Lap chole - 2000's by Dr. Carolynne Edouard, Appendiceal carcinoid - 1987, last colonoscopy about 2000.  SOCIAL and FAMILY HISTORY: Married.  Husband, [name], with patient. They have 3 daughters - 22,19, 23.  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  PHYSICAL EXAM: BP 124/78  Pulse 84  Resp 16  Ht 5\' 8"  (1.727 m)  Wt 159 lb 6.4 oz (72.303 kg)  BMI 24.24 kg/m2  General: WN WF who is alert and generally healthy appearing.  Breasts:  Left:  Absent.  Pigmented from rad tx.  No nodule or mass.  No  left arm lymphedema.  Scar from extravasation is okay.  Right:  Port in place.  Otherwise unremarkable.  DATA REVIEWED: Notes in Epic  Ovidio Kin, MD,  [facility], [place] [address] [place]  [address]   [place], [place]    [address] Phone:  [phone] FAX:  [phone]

## 2013-01-05 NOTE — Telephone Encounter (Signed)
Patient has appt with Dr Odis Luster on Feb 5 at 7:45 am office note and path report faxed to [facility] .

## 2013-01-06 ENCOUNTER — Telehealth (INDEPENDENT_AMBULATORY_CARE_PROVIDER_SITE_OTHER): Payer: Self-pay

## 2013-01-06 NOTE — Telephone Encounter (Signed)
Pt aware of appt 01/27/13@12n 

## 2013-01-12 DIAGNOSIS — C50919 Malignant neoplasm of unspecified site of unspecified female breast: Secondary | ICD-10-CM

## 2013-01-27 ENCOUNTER — Encounter (INDEPENDENT_AMBULATORY_CARE_PROVIDER_SITE_OTHER): Payer: Commercial Managed Care - PPO | Admitting: Surgery

## 2013-02-28 ENCOUNTER — Encounter (INDEPENDENT_AMBULATORY_CARE_PROVIDER_SITE_OTHER): Payer: Self-pay

## 2013-03-08 ENCOUNTER — Encounter: Payer: Self-pay | Admitting: *Deleted

## 2013-03-16 ENCOUNTER — Ambulatory Visit: Payer: Commercial Managed Care - PPO | Admitting: Radiation Oncology

## 2013-03-17 ENCOUNTER — Ambulatory Visit
Admission: RE | Admit: 2013-03-17 | Discharge: 2013-03-17 | Disposition: A | Discharge status: Home / Self Care | Payer: Commercial Managed Care - PPO | Source: Ambulatory Visit | Attending: Radiation Oncology | Admitting: Radiation Oncology

## 2013-03-17 VITALS — BP 118/74 | HR 92 | Temp 98.6°F | Ht 68.0 in | Wt 164.4 lb

## 2013-03-17 DIAGNOSIS — C50512 Malignant neoplasm of lower-outer quadrant of left female breast: Secondary | ICD-10-CM

## 2013-03-17 NOTE — Progress Notes (Signed)
   Department of Radiation Oncology  Phone:  [phone] Fax:        [phone]   Name: Sydney Cervantes MRN: [id number]  DOB: Jan 10, 1961  Date: 03/17/2013  Follow Up Visit Note  Dia invasive lobular carcinoma of the gnosis: T4N3M1 invasive lobular carcinoma of the left breast  Summary and Interval since last radiation: Radiation to the right supraclavicular lymph nodes as well as left chest wall and draining lymph nodes to a total dose of 60.4 gray completed December 2012  Interval History: Sydney Cervantes presents today for routine followup.  She is feeling well and doing well. She still has skin darkening over her left chest wall bilateral supraclavicular fossa this. She met with plastic surgery and is considering reconstruction. She is a little but apprehensive regarding this. She is tolerating her tamoxifen well. She has a followup with medical oncology in April. She was able to participate in the relay for life in [place] last weekend. She is participating in our survivorship class which she finds very helpful.  Allergies:  Allergies  Allergen Reactions  . Adhesive (Tape) Itching and Rash    Rash and itching at steristrip and EKG lead pad sites    Medications:  Current Outpatient Prescriptions  Medication Sig Dispense Refill  . acetaminophen (TYLENOL) 325 MG tablet Take 650 mg by mouth every 6 (six) hours as needed.      . Calcium Carbonate-Vitamin D (CALTRATE 600+D PO) Take by mouth daily.       Marland Kitchen ibuprofen (ADVIL,MOTRIN) 200 MG tablet Take 200 mg by mouth every 6 (six) hours as needed.      . tamoxifen (NOLVADEX) 20 MG tablet Take 1 tablet (20 mg total) by mouth daily.  30 tablet  11  . lidocaine-prilocaine (EMLA) cream Apply 1 application topically as needed. Prior to any blood draws or testing through port.       No current facility-administered medications for this encounter.   Facility-Administered Medications Ordered in Other Encounters  Medication Dose Route Frequency  Provider Last Rate Last Dose  . topical emolient (BIAFINE) emulsion   Topical Daily Lurline Hare, MD        Physical Exam:  Filed Vitals:   03/17/13 1310  BP: 118/74  Pulse: 92  Temp: 98.6 F (37 C)   she is a pleasant female in no distress sitting comfortably examining table. She has hyperpigmentation of the left chest wall. She has telangiectasias of her left supraclavicular fossa. Her skin changes in the interim SPECT of the right clavicle. There is no palpable or visible signs of tumor recurrence along the left chest wall scar. Her right breast is negative. No palpable axillary supraclavicular or cervical adenopathy.  IMPRESSION: Sydney Cervantes is a 52 y.o. female status post chemotherapy, mastectomy and radical radiation for invasive lobular cancer with no evidence of disease  PLAN:  I congratulated her son looking so well. We discussed skin protection in the treated area. She has followup with medical oncology. I encouraged her to contact us with any concerning signs or symptoms. I released her from followup with me. I be happy to see her back on an as-needed basis.    Lurline Hare, MD

## 2013-03-17 NOTE — Progress Notes (Signed)
Sydney Cervantes here with her husband for follow up after treatment to her left breast.  She denies pain and states her fatigue has improved.  She does have hyperpigmentation of her left breast area and continues to apply radiaplex gel to the area.

## 2013-03-20 ENCOUNTER — Other Ambulatory Visit (HOSPITAL_BASED_OUTPATIENT_CLINIC_OR_DEPARTMENT_OTHER): Payer: Commercial Managed Care - PPO | Admitting: Lab

## 2013-03-20 DIAGNOSIS — C50519 Malignant neoplasm of lower-outer quadrant of unspecified female breast: Secondary | ICD-10-CM

## 2013-03-20 LAB — CBC WITH DIFFERENTIAL/PLATELET
BASO%: 0.4 % (ref 0.0–2.0)
HCT: 42.5 % (ref 34.8–46.6)
MCHC: 33.2 g/dL (ref 31.5–36.0)
MONO#: 0.4 10*3/uL (ref 0.1–0.9)
RBC: 4.42 10*6/uL (ref 3.70–5.45)
RDW: 13.3 % (ref 11.2–14.5)
WBC: 4.5 10*3/uL (ref 3.9–10.3)
lymph#: 1.1 10*3/uL (ref 0.9–3.3)
nRBC: 0 % (ref 0–0)

## 2013-03-20 LAB — COMPREHENSIVE METABOLIC PANEL (CC13)
ALT: 47 U/L (ref 0–55)
AST: 37 U/L — ABNORMAL HIGH (ref 5–34)
Albumin: 3.7 g/dL (ref 3.5–5.0)
Alkaline Phosphatase: 109 U/L (ref 40–150)
Calcium: 9.4 mg/dL (ref 8.4–10.4)
Chloride: 108 mEq/L — ABNORMAL HIGH (ref 98–107)
Potassium: 3.8 mEq/L (ref 3.5–5.1)
Sodium: 143 mEq/L (ref 136–145)

## 2013-03-27 ENCOUNTER — Telehealth: Payer: Self-pay | Admitting: Oncology

## 2013-03-27 ENCOUNTER — Ambulatory Visit (HOSPITAL_BASED_OUTPATIENT_CLINIC_OR_DEPARTMENT_OTHER): Payer: Commercial Managed Care - PPO | Admitting: Oncology

## 2013-03-27 VITALS — BP 126/85 | HR 111 | Temp 98.8°F | Resp 20 | Ht 68.0 in | Wt 161.9 lb

## 2013-03-27 DIAGNOSIS — C50519 Malignant neoplasm of lower-outer quadrant of unspecified female breast: Secondary | ICD-10-CM

## 2013-03-27 DIAGNOSIS — C773 Secondary and unspecified malignant neoplasm of axilla and upper limb lymph nodes: Secondary | ICD-10-CM

## 2013-03-27 DIAGNOSIS — Z170 Estrogen receptor positive status [ER+]: Secondary | ICD-10-CM

## 2013-03-27 DIAGNOSIS — C50512 Malignant neoplasm of lower-outer quadrant of left female breast: Secondary | ICD-10-CM

## 2013-03-27 NOTE — Progress Notes (Signed)
ID: Sydney Cervantes   DOB: 1961/06/03  MR#: [id number]  AOZ#:[id number]  GYN:  Sydney Dus, NP  SU:  Dr. Ovidio Kin  OTHER MD: Etter Sjogren  HISTORY OF PRESENT ILLNESS:  Sydney Cervantes is a 52 year old [place] woman with:  1. 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  ER/PR positive at 63/5% respectively, HER-2 negative, elevated Ki-67. Completed 4/6 planned neoadjuvant dose dense FEC with Neulasta support on day 2. 20% dose reduction on 5-FU, 50% dose reduction on epirubicin, 40% dose reduction on Cytoxan, after one-week delay due to anemia/thrombocytopenia/and region of probable localized thrombophlebitis versus mild chemotherapy extravasation on the left forearm, covered with Keflex 500 mg by mouth 3 times a day. Switched to single agent neoadjuvant Taxol, given 3 weeks on 1 week off, completed 4 cycles on 08/02/2012.  2. Status post left modified radical mastectomy on 08/25/2012 with final pathology showing no tumor in the primary tumor bed with 7 of 8 involved lymph nodes. The final pathology showed an ER positive tumor, HER-2 negative.  She completed radiation on 11/18/12 and started taking Tamoxifen on 11/21/2012.  It is also noted that she is BRCA negative.   INTERVAL HISTORY: She returns today for routine followup of her breast cancer. The known history is unremarkable.  Since her last visit here she had her port removed. She is working with Etter Sjogren on her reconstruction  REVIEW OF SYSTEMS: [name] is tolerating tamoxifen well. There have been some hot flashes but they're not a major concern. There has been no vaginal wetness. She has occasional joint and back pain which is not new, not more intense or frequent than before. She has mild urinary stress incontinence and a history of frequent urinary tract infections. Sometimes she has shoulder pain. She can be mildly fatigued  at times. Overall a detailed review of systems today was benign   PAST MEDICAL HISTORY: Past Medical History  Diagnosis Date  . Hx of colonoscopy 2000  . Wears glasses   . Seasonal allergies     "when molds and things come out; pollens" (08/25/2012)  . Breast cancer     left  . Anemia     "chemo induced" (08/25/2012)  . GERD (gastroesophageal reflux disease)     occ; "side effect of chemo" (08/25/2012)  . Hx of radiation therapy 09/21/12 -11/17/12    R supraclav fossa, L chest wall/SCVL fossa/PAB mid axilla/IM nodes/scar boost    PAST SURGICAL HISTORY: Past Surgical History  Procedure Laterality Date  . 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 Portacath placement  02/11/2012    Procedure: INSERTION PORT-A-CATH;  Surgeon: Kandis Cocking, MD;  Location: [facility] OR;  Service: General;  Laterality: Right;  Subclavian  . Appendectomy  1987  . Cholecystectomy  2003  . Dilation and curettage of uterus  1992  . Mastectomy modified radical  08/25/2012    left  . Breast biopsy  01/2012    left    FAMILY HISTORY Family History  Problem Relation Age of Onset  . Breast cancer Mother   . Breast cancer Paternal Aunt   . Cancer Maternal Grandmother     Hodgkins Disease    GYNECOLOGIC HISTORY:  G3P2.  Menarche at 52 years of age.  Followed by Sydney Cervantes.    SOCIAL HISTORY:  Married with 2 children.  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   HEALTH MAINTENANCE: History  Substance Use Topics  . Smoking status: Never Smoker   . Smokeless tobacco: Never Used  . Alcohol Use: 0.6 oz/week    1 Glasses of wine per week     Comment: 08/25/2012 "used to have 5 glasses wine/wk; not anymore"    Allergies  Allergen Reactions  . Adhesive (Tape) Itching and Rash    Rash and itching at steristrip and EKG lead pad sites    Current Outpatient Prescriptions  Medication Sig Dispense Refill  . acetaminophen (TYLENOL) 325 MG tablet  Take 650 mg by mouth every 6 (six) hours as needed.      . Calcium Carbonate-Vitamin D (CALTRATE 600+D PO) Take by mouth daily.       Marland Kitchen ibuprofen (ADVIL,MOTRIN) 200 MG tablet Take 200 mg by mouth every 6 (six) hours as needed.      . tamoxifen (NOLVADEX) 20 MG tablet Take 1 tablet (20 mg total) by mouth daily.  30 tablet  11   No current facility-administered medications for this visit.    OBJECTIVE: Filed Vitals:   03/27/13 1155  BP: 126/85  Pulse: 111  Temp: 98.8 F (37.1 C)  Resp: 20     Body mass index is 24.62 kg/(m^2).    ECOG FS: 1  Sclerae unicteric Oropharynx clear No cervical or supraclavicular adenopathy Lungs no rales or rhonchi Heart regular rate and rhythm Abd benign MSK no focal spinal tenderness, no peripheral edema Neuro: nonfocal Breasts: The right breast is unremarkable; the left breast is status post modified radical mastectomy; there is no evidence of local recurrence. The left axilla is unremarkable the  LAB RESULTS: Lab Results  Component Value Date   WBC 4.5 03/20/2013   NEUTROABS 2.8 03/20/2013   HGB 14.1 03/20/2013   HCT 42.5 03/20/2013   MCV 96.2 03/20/2013   PLT 114* 03/20/2013      Chemistry      Component Value Date/Time   NA 143 03/20/2013 1438   NA 142 08/19/2012 1506   K 3.8 03/20/2013 1438   K 4.3 08/19/2012 1506   CL 108* 03/20/2013 1438   CL 106 08/19/2012 1506   CO2 26 03/20/2013 1438   CO2 26 08/19/2012 1506   BUN 13.5 03/20/2013 1438   BUN 10 08/19/2012 1506   CREATININE 0.9 03/20/2013 1438   CREATININE 0.78 08/25/2012 1336      Component Value Date/Time   CALCIUM 9.4 03/20/2013 1438   CALCIUM 9.5 08/19/2012 1506   ALKPHOS 109 03/20/2013 1438   ALKPHOS 62 06/21/2012 1259   AST 37* 03/20/2013 1438   AST 42* 06/21/2012 1259   ALT 47 03/20/2013 1438   ALT 45* 06/21/2012 1259   BILITOT 0.42 03/20/2013 1438   BILITOT 0.5 06/21/2012 1259       Lab Results  Component Value Date   LABCA2 56* 03/08/2012    No components found with this  basename: [id number]    No results found for this basename: INR,  in the last 168 hours  Urinalysis    Component Value Date/Time   LABSPEC 1.010 03/16/2012 1248    STUDIES: No results found.  ASSESSMENT:  52 y.o. BRCA negative [place] woman  (1) s/p left breast and left axillary lymph node biopsies every 10/26/2012 for a clinical T4 N3, stage IIIC invasive lobular carcinoma, grade 3, estrogen receptor 62% positive, progesterone receptor 5% positive, with an MIB-1 of 33% and no HER-2  amplification.  (2) received 4/6 planned neoadjuvant cycles of dose dense cyclophosphamide, epirubicin and fluorouracil followed by weekly paclitaxel x 12 completed Taxol 08/02/2012.   (3) left modified radical mastectomy 08/25/2012 showed no residual tumor in the breast, but 7/8 sampled lymph nodes were positive, 6 with macro metastases. Repeat HER-2 was again negative.  (4) She completed radiation on 11/18/12   (5) started tamoxifen on 11/21/2012.  PLAN: Laylaa is doing very well from a breast cancer point of view. We're going to continue to see her on an every 3 month basis until October, at which point we will start seeing her every 6 months. She will continue to work with Dr. Odis Luster regarding her reconstruction. She knows to call for any problems that may develop before her next visit.  Ona Rathert C    03/31/2013

## 2013-05-16 ENCOUNTER — Ambulatory Visit: Payer: Commercial Managed Care - PPO | Attending: Plastic Surgery | Admitting: Physical Therapy

## 2013-05-16 DIAGNOSIS — C50919 Malignant neoplasm of unspecified site of unspecified female breast: Secondary | ICD-10-CM | POA: Insufficient documentation

## 2013-05-16 DIAGNOSIS — M25519 Pain in unspecified shoulder: Secondary | ICD-10-CM | POA: Insufficient documentation

## 2013-05-16 DIAGNOSIS — IMO0001 Reserved for inherently not codable concepts without codable children: Secondary | ICD-10-CM | POA: Insufficient documentation

## 2013-05-16 DIAGNOSIS — M24519 Contracture, unspecified shoulder: Secondary | ICD-10-CM | POA: Insufficient documentation

## 2013-05-18 ENCOUNTER — Encounter (HOSPITAL_COMMUNITY): Payer: Self-pay | Admitting: Pharmacy Technician

## 2013-05-19 ENCOUNTER — Encounter (HOSPITAL_COMMUNITY): Admission: RE | Admit: 2013-05-19 | Payer: Commercial Managed Care - PPO | Source: Ambulatory Visit

## 2013-05-23 ENCOUNTER — Telehealth: Payer: Self-pay | Admitting: Radiation Oncology

## 2013-05-23 ENCOUNTER — Other Ambulatory Visit (HOSPITAL_COMMUNITY): Payer: Commercial Managed Care - PPO

## 2013-05-23 NOTE — Telephone Encounter (Signed)
Faxed EOT to [employer], [phone].  OK per SW.  Received confirmation.

## 2013-05-25 ENCOUNTER — Ambulatory Visit: Payer: Commercial Managed Care - PPO

## 2013-05-30 ENCOUNTER — Ambulatory Visit: Payer: Commercial Managed Care - PPO | Admitting: Physical Therapy

## 2013-05-30 ENCOUNTER — Encounter (HOSPITAL_COMMUNITY): Admission: RE | Payer: Self-pay | Source: Ambulatory Visit

## 2013-05-30 ENCOUNTER — Telehealth: Payer: Self-pay | Admitting: *Deleted

## 2013-05-30 SURGERY — RECONSTRUCTION, BREAST
Anesthesia: General | Site: Breast | Laterality: Right

## 2013-05-30 NOTE — Telephone Encounter (Signed)
Pt called to cancel her lab for 06/19/13 and to r/s for 06/20/13. gv appt d/t for 06/20/13 @ 2:15pm..pt is aware....td

## 2013-06-01 ENCOUNTER — Ambulatory Visit: Payer: Commercial Managed Care - PPO | Admitting: Physical Therapy

## 2013-06-05 ENCOUNTER — Ambulatory Visit: Payer: Commercial Managed Care - PPO | Admitting: Physical Therapy

## 2013-06-07 ENCOUNTER — Ambulatory Visit: Payer: Commercial Managed Care - PPO | Attending: Plastic Surgery | Admitting: Physical Therapy

## 2013-06-07 ENCOUNTER — Inpatient Hospital Stay (HOSPITAL_COMMUNITY)
Admission: RE | Admit: 2013-06-07 | Payer: Commercial Managed Care - PPO | Source: Ambulatory Visit | Admitting: Plastic Surgery

## 2013-06-07 ENCOUNTER — Ambulatory Visit: Payer: Commercial Managed Care - PPO | Admitting: Physical Therapy

## 2013-06-07 DIAGNOSIS — M24519 Contracture, unspecified shoulder: Secondary | ICD-10-CM | POA: Insufficient documentation

## 2013-06-07 DIAGNOSIS — IMO0001 Reserved for inherently not codable concepts without codable children: Secondary | ICD-10-CM | POA: Insufficient documentation

## 2013-06-07 DIAGNOSIS — M25519 Pain in unspecified shoulder: Secondary | ICD-10-CM | POA: Insufficient documentation

## 2013-06-07 DIAGNOSIS — C50919 Malignant neoplasm of unspecified site of unspecified female breast: Secondary | ICD-10-CM | POA: Insufficient documentation

## 2013-06-13 ENCOUNTER — Ambulatory Visit: Payer: Commercial Managed Care - PPO | Admitting: Physical Therapy

## 2013-06-15 ENCOUNTER — Ambulatory Visit: Payer: Commercial Managed Care - PPO | Admitting: Physical Therapy

## 2013-06-19 ENCOUNTER — Other Ambulatory Visit: Payer: Commercial Managed Care - PPO

## 2013-06-20 ENCOUNTER — Other Ambulatory Visit (HOSPITAL_BASED_OUTPATIENT_CLINIC_OR_DEPARTMENT_OTHER): Payer: Commercial Managed Care - PPO | Admitting: Lab

## 2013-06-20 ENCOUNTER — Ambulatory Visit: Payer: Commercial Managed Care - PPO | Admitting: Physical Therapy

## 2013-06-20 DIAGNOSIS — C50519 Malignant neoplasm of lower-outer quadrant of unspecified female breast: Secondary | ICD-10-CM

## 2013-06-20 DIAGNOSIS — C50512 Malignant neoplasm of lower-outer quadrant of left female breast: Secondary | ICD-10-CM

## 2013-06-20 LAB — COMPREHENSIVE METABOLIC PANEL (CC13)
AST: 42 U/L — ABNORMAL HIGH (ref 5–34)
Albumin: 3.7 g/dL (ref 3.5–5.0)
Alkaline Phosphatase: 99 U/L (ref 40–150)
BUN: 14.1 mg/dL (ref 7.0–26.0)
Glucose: 87 mg/dl (ref 70–140)
Potassium: 3.7 mEq/L (ref 3.5–5.1)
Sodium: 144 mEq/L (ref 136–145)
Total Bilirubin: 0.42 mg/dL (ref 0.20–1.20)

## 2013-06-20 LAB — CBC WITH DIFFERENTIAL/PLATELET
Basophils Absolute: 0 10*3/uL (ref 0.0–0.1)
EOS%: 4 % (ref 0.0–7.0)
LYMPH%: 24.5 % (ref 14.0–49.7)
MCH: 32.6 pg (ref 25.1–34.0)
MCV: 96.5 fL (ref 79.5–101.0)
MONO%: 12.2 % (ref 0.0–14.0)
Platelets: 139 10*3/uL — ABNORMAL LOW (ref 145–400)
RBC: 4.24 10*6/uL (ref 3.70–5.45)
RDW: 14.2 % (ref 11.2–14.5)

## 2013-06-21 ENCOUNTER — Ambulatory Visit: Payer: Commercial Managed Care - PPO

## 2013-06-22 ENCOUNTER — Ambulatory Visit: Payer: Commercial Managed Care - PPO | Admitting: Physical Therapy

## 2013-06-26 ENCOUNTER — Telehealth: Payer: Self-pay | Admitting: *Deleted

## 2013-06-26 ENCOUNTER — Ambulatory Visit (HOSPITAL_BASED_OUTPATIENT_CLINIC_OR_DEPARTMENT_OTHER): Payer: Commercial Managed Care - PPO | Admitting: Physician Assistant

## 2013-06-26 ENCOUNTER — Encounter: Payer: Self-pay | Admitting: Physician Assistant

## 2013-06-26 VITALS — BP 125/85 | HR 109 | Temp 99.0°F | Resp 20 | Ht 68.0 in | Wt 159.8 lb

## 2013-06-26 DIAGNOSIS — D696 Thrombocytopenia, unspecified: Secondary | ICD-10-CM

## 2013-06-26 DIAGNOSIS — C50519 Malignant neoplasm of lower-outer quadrant of unspecified female breast: Secondary | ICD-10-CM

## 2013-06-26 DIAGNOSIS — C50512 Malignant neoplasm of lower-outer quadrant of left female breast: Secondary | ICD-10-CM

## 2013-06-26 DIAGNOSIS — R748 Abnormal levels of other serum enzymes: Secondary | ICD-10-CM

## 2013-06-26 NOTE — Telephone Encounter (Signed)
appts made and printed...td 

## 2013-06-26 NOTE — Progress Notes (Signed)
ID: [name]   DOB: 27-Jan-1961  MR#: [id number]  WUX#:[id number]  GYN:  Debbora Dus, NP  SU:  Dr. Ovidio Kin  OTHER MD: Etter Sjogren, MD:  Hal Neer, MD  HISTORY OF PRESENT ILLNESS:  Keyia Moretto is a 52 year old [place] woman with:  1. 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  ER/PR positive at 63/5% respectively, HER-2 negative, elevated Ki-67. Completed 4/6 planned neoadjuvant dose dense FEC with Neulasta support on day 2. 20% dose reduction on 5-FU, 50% dose reduction on epirubicin, 40% dose reduction on Cytoxan, after one-week delay due to anemia/thrombocytopenia/and region of probable localized thrombophlebitis versus mild chemotherapy extravasation on the left forearm, covered with Keflex 500 mg by mouth 3 times a day. Switched to single agent neoadjuvant Taxol, given 3 weeks on 1 week off, completed 4 cycles on 08/02/2012.  2. Status post left modified radical mastectomy on 08/25/2012 with final pathology showing no tumor in the primary tumor bed with 7 of 8 involved lymph nodes. The final pathology showed an ER positive tumor, HER-2 negative.  She completed radiation on 11/18/12 and started taking Tamoxifen on 11/21/2012.  It is also noted that she is BRCA negative.   INTERVAL HISTORY:  Marion returns today accompanied by her husband [name] for three-month followup of her left breast carcinoma. She continues on tamoxifen which she is tolerating well with only occasional hot flashes.   She's being seen at the lymphedema clinic for physical therapy to improve lymphedema and increased range of motion in the left shoulder. Her reconstruction under the care of Dr. Odis Luster has been delayed while this is being treated.  Quentina is staying busy. She is exercising regularly, going to the gym 2-3 times weekly. She is in the process of moving one daughter back to college at  [employer], and another daughter will be starting college this year at [employer].   REVIEW OF SYSTEMS: Kyasia has had no recent illnesses and denies any fevers or chills. Her energy level is good. She's had no rashes or abnormal bleeding, but does bruise easily. She denies any vaginal bleeding and has had no vaginal dryness or discharge. Her appetite is good and she's had no nausea or change in bowel or bladder habits. She denies any cough, shortness of breath, chest pain, palpitations. She's had no abnormal headaches or dizziness and denies change in vision. No unusual myalgias, arthralgias, bony pain, or peripheral swelling.  A detailed review of systems is otherwise noncontributory.   PAST MEDICAL HISTORY: Past Medical History  Diagnosis Date  . Hx of colonoscopy 2000  . Wears glasses   . Seasonal allergies     "when molds and things come out; pollens" (08/25/2012)  . Breast cancer     left  . Anemia     "chemo induced" (08/25/2012)  . GERD (gastroesophageal reflux disease)     occ; "side effect of chemo" (08/25/2012)  . Hx of radiation therapy 09/21/12 -11/17/12    R supraclav fossa, L chest wall/SCVL fossa/PAB mid axilla/IM nodes/scar boost    PAST SURGICAL HISTORY: Past Surgical History  Procedure Laterality Date  . 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 Portacath placement  02/11/2012    Procedure: INSERTION PORT-A-CATH;  Surgeon: Kandis Cocking, MD;  Location: [facility] OR;  Service: General;  Laterality: Right;  Subclavian  . Appendectomy  1987  .  Cholecystectomy  2003  . Dilation and curettage of uterus  1992  . Mastectomy modified radical  08/25/2012    left  . Breast biopsy  01/2012    left    FAMILY HISTORY Family History  Problem Relation Age of Onset  . Breast cancer Mother   . Breast cancer Paternal Aunt   . Cancer Maternal Grandmother     Hodgkins Disease    GYNECOLOGIC HISTORY:  G3P2.  Menarche at 52 years of age.  Followed by Debbora Dus.    SOCIAL HISTORY:  Married with 2 children.  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   HEALTH MAINTENANCE: History  Substance Use Topics  . Smoking status: Never Smoker   . Smokeless tobacco: Never Used  . Alcohol Use: 0.0 oz/week    7-10 Glasses of wine per week     Comment: 06/26/2013 - 1-1.5 glasses of wine nightly    Allergies  Allergen Reactions  . Adhesive (Tape) Itching and Rash    Rash and itching at steristrip and EKG lead pad sites    Current Outpatient Prescriptions  Medication Sig Dispense Refill  . Calcium Carbonate-Vitamin D (CALTRATE 600+D PO) Take 1 tablet by mouth daily.       . tamoxifen (NOLVADEX) 20 MG tablet Take 1 tablet (20 mg total) by mouth daily.  30 tablet  11  . acetaminophen (TYLENOL) 325 MG tablet Take 650 mg by mouth every 6 (six) hours as needed. For pain      . ibuprofen (ADVIL,MOTRIN) 200 MG tablet Take 200-400 mg by mouth daily as needed. For pain      . nystatin (MYCOSTATIN) 100000 UNIT/ML suspension        No current facility-administered medications for this visit.    OBJECTIVE: Middle-aged white female who appears comfortable and is in no acute distress. Filed Vitals:   06/26/13 1041  BP: 125/85  Pulse: 109  Temp: 99 F (37.2 C)  Resp: 20     Body mass index is 24.3 kg/(m^2).    ECOG FS: 0  Sclerae unicteric Oropharynx clear No cervical or supraclavicular adenopathy Lungs clear to auscultation bilaterally, no wheezes, no rales or rhonchi Heart regular rate and rhythm, no murmurs appreciated Abdomen soft, nontender to palpation, no hepatomegaly palpated, positive bowel sounds MSK no focal spinal tenderness, no peripheral edema.  There is some limited range of motion in the left shoulder Neuro: nonfocal, well oriented with positive affect Breasts: The right breast is unremarkable; the left breast is status post modified radical mastectomy; there is no evidence of local  recurrence. Axillae are benign bilaterally with no palpable adenopathy.   LAB RESULTS: Lab Results  Component Value Date   WBC 4.9 06/20/2013   NEUTROABS 2.9 06/20/2013   HGB 13.8 06/20/2013   HCT 40.9 06/20/2013   MCV 96.5 06/20/2013   PLT 139* 06/20/2013      Chemistry      Component Value Date/Time   NA 144 06/20/2013 1402   NA 142 08/19/2012 1506   K 3.7 06/20/2013 1402   K 4.3 08/19/2012 1506   CL 108* 03/20/2013 1438   CL 106 08/19/2012 1506   CO2 24 06/20/2013 1402   CO2 26 08/19/2012 1506   BUN 14.1 06/20/2013 1402   BUN 10 08/19/2012 1506   CREATININE 1.0 06/20/2013 1402   CREATININE 0.78 08/25/2012 1336      Component Value Date/Time   CALCIUM 9.5 06/20/2013 1402  CALCIUM 9.5 08/19/2012 1506   ALKPHOS 99 06/20/2013 1402   ALKPHOS 62 06/21/2012 1259   AST 42* 06/20/2013 1402   AST 42* 06/21/2012 1259   ALT 53 06/20/2013 1402   ALT 45* 06/21/2012 1259   BILITOT 0.42 06/20/2013 1402   BILITOT 0.5 06/21/2012 1259       STUDIES:  Most recent right mammogram at [facility] on 05/17/2013 was unremarkable.   ASSESSMENT:  52 y.o. BRCA negative [place] woman  (1) s/p left breast and left axillary lymph node biopsies every 01/18/2012 for a clinical T4 N3, stage IIIC invasive lobular carcinoma, grade 3, estrogen receptor 62% positive, progesterone receptor 5% positive, with an MIB-1 of 33% and no HER-2 amplification.  (2) Began chemotherapy in March 2013, and received 4/6 planned neoadjuvant cycles of dose dense cyclophosphamide, epirubicin and fluorouracil followed by weekly paclitaxel x 12 completed Taxol 08/02/2012.   (3) left modified radical mastectomy 08/25/2012 showed no residual tumor in the breast, but 7/8 sampled lymph nodes were positive, 6 with macro metastases. Repeat HER-2 was again negative.  (4) She completed radiation on 11/18/12   (5) started tamoxifen on 11/21/2012.  PLAN: Ada appears to be doing well, and will continue on tamoxifen as before.  We are going to  recheck her labs in approximately one month, primarily to follow her low platelets and her slightly elevated liver enzymes. We'll then repeat labs again in October when she returns for her routine three-month visit. At that point, if she is doing well, we will likely begin seeing her on an every 6 month schedule.  She'll continue to followup with Dr. Odis Luster in anticipation of reconstruction surgery.  Aviv voices understanding and agreement with this plan, and will call with any changes or problems.   Cedrik Heindl    06/26/2013

## 2013-06-27 ENCOUNTER — Ambulatory Visit: Payer: Commercial Managed Care - PPO | Admitting: Physical Therapy

## 2013-06-29 ENCOUNTER — Encounter: Payer: Commercial Managed Care - PPO | Admitting: Physical Therapy

## 2013-06-29 ENCOUNTER — Ambulatory Visit: Payer: Commercial Managed Care - PPO | Admitting: Physical Therapy

## 2013-07-04 ENCOUNTER — Ambulatory Visit: Payer: Commercial Managed Care - PPO

## 2013-07-06 ENCOUNTER — Ambulatory Visit: Payer: Commercial Managed Care - PPO | Admitting: Physical Therapy

## 2013-07-11 ENCOUNTER — Ambulatory Visit: Payer: Commercial Managed Care - PPO | Attending: Plastic Surgery

## 2013-07-11 DIAGNOSIS — IMO0001 Reserved for inherently not codable concepts without codable children: Secondary | ICD-10-CM | POA: Insufficient documentation

## 2013-07-11 DIAGNOSIS — C50919 Malignant neoplasm of unspecified site of unspecified female breast: Secondary | ICD-10-CM | POA: Insufficient documentation

## 2013-07-11 DIAGNOSIS — M25519 Pain in unspecified shoulder: Secondary | ICD-10-CM | POA: Insufficient documentation

## 2013-07-11 DIAGNOSIS — M24519 Contracture, unspecified shoulder: Secondary | ICD-10-CM | POA: Insufficient documentation

## 2013-07-13 ENCOUNTER — Encounter: Payer: Commercial Managed Care - PPO | Admitting: Physical Therapy

## 2013-07-14 ENCOUNTER — Ambulatory Visit: Payer: Commercial Managed Care - PPO | Admitting: Physical Therapy

## 2013-07-18 ENCOUNTER — Ambulatory Visit: Payer: Commercial Managed Care - PPO

## 2013-07-20 ENCOUNTER — Encounter: Payer: Commercial Managed Care - PPO | Admitting: Physical Therapy

## 2013-07-21 ENCOUNTER — Ambulatory Visit: Payer: Commercial Managed Care - PPO

## 2013-07-24 ENCOUNTER — Ambulatory Visit: Payer: Commercial Managed Care - PPO | Admitting: Physical Therapy

## 2013-07-24 ENCOUNTER — Other Ambulatory Visit (HOSPITAL_BASED_OUTPATIENT_CLINIC_OR_DEPARTMENT_OTHER): Payer: Commercial Managed Care - PPO

## 2013-07-24 DIAGNOSIS — C50512 Malignant neoplasm of lower-outer quadrant of left female breast: Secondary | ICD-10-CM

## 2013-07-24 DIAGNOSIS — R748 Abnormal levels of other serum enzymes: Secondary | ICD-10-CM

## 2013-07-24 DIAGNOSIS — C50519 Malignant neoplasm of lower-outer quadrant of unspecified female breast: Secondary | ICD-10-CM

## 2013-07-24 LAB — COMPREHENSIVE METABOLIC PANEL (CC13)
ALT: 64 U/L — ABNORMAL HIGH (ref 0–55)
AST: 54 U/L — ABNORMAL HIGH (ref 5–34)
BUN: 12.4 mg/dL (ref 7.0–26.0)
Creatinine: 0.8 mg/dL (ref 0.6–1.1)
Total Bilirubin: 0.45 mg/dL (ref 0.20–1.20)

## 2013-07-24 LAB — CBC WITH DIFFERENTIAL/PLATELET
BASO%: 1 % (ref 0.0–2.0)
EOS%: 4.5 % (ref 0.0–7.0)
HCT: 41.1 % (ref 34.8–46.6)
LYMPH%: 26.7 % (ref 14.0–49.7)
MCH: 32.2 pg (ref 25.1–34.0)
MCHC: 33.5 g/dL (ref 31.5–36.0)
MCV: 96.1 fL (ref 79.5–101.0)
MONO%: 12.2 % (ref 0.0–14.0)
NEUT%: 55.6 % (ref 38.4–76.8)
Platelets: 133 10*3/uL — ABNORMAL LOW (ref 145–400)

## 2013-07-26 ENCOUNTER — Other Ambulatory Visit: Payer: Self-pay | Admitting: Emergency Medicine

## 2013-07-26 ENCOUNTER — Ambulatory Visit: Payer: Commercial Managed Care - PPO

## 2013-07-26 DIAGNOSIS — C50919 Malignant neoplasm of unspecified site of unspecified female breast: Secondary | ICD-10-CM

## 2013-07-26 DIAGNOSIS — C50519 Malignant neoplasm of lower-outer quadrant of unspecified female breast: Secondary | ICD-10-CM

## 2013-07-28 ENCOUNTER — Encounter (HOSPITAL_COMMUNITY): Payer: Self-pay

## 2013-07-28 ENCOUNTER — Ambulatory Visit (HOSPITAL_COMMUNITY)
Admission: RE | Admit: 2013-07-28 | Discharge: 2013-07-28 | Disposition: A | Discharge status: Home / Self Care | Payer: Commercial Managed Care - PPO | Source: Ambulatory Visit | Attending: Physician Assistant | Admitting: Physician Assistant

## 2013-07-28 ENCOUNTER — Other Ambulatory Visit: Payer: Self-pay | Admitting: Physician Assistant

## 2013-07-28 ENCOUNTER — Encounter: Payer: Self-pay | Admitting: Physician Assistant

## 2013-07-28 DIAGNOSIS — C50512 Malignant neoplasm of lower-outer quadrant of left female breast: Secondary | ICD-10-CM

## 2013-07-28 DIAGNOSIS — R748 Abnormal levels of other serum enzymes: Secondary | ICD-10-CM | POA: Insufficient documentation

## 2013-07-28 DIAGNOSIS — C50519 Malignant neoplasm of lower-outer quadrant of unspecified female breast: Secondary | ICD-10-CM

## 2013-07-28 DIAGNOSIS — K7689 Other specified diseases of liver: Secondary | ICD-10-CM | POA: Insufficient documentation

## 2013-07-28 DIAGNOSIS — Z9010 Acquired absence of unspecified breast and nipple: Secondary | ICD-10-CM | POA: Insufficient documentation

## 2013-07-28 DIAGNOSIS — C50919 Malignant neoplasm of unspecified site of unspecified female breast: Secondary | ICD-10-CM | POA: Insufficient documentation

## 2013-07-28 MED ORDER — IOHEXOL 300 MG/ML  SOLN
100.0000 mL | Freq: Once | INTRAMUSCULAR | Status: AC | PRN
  Administered 2013-07-28: 100 mL via INTRAVENOUS

## 2013-07-28 NOTE — Progress Notes (Signed)
Dyan had routine labs drawn on 07/24/2013 which showed elevation in her AST of 54, and an elevation in ALT of 64. We subsequently obtained restaging studies, including a chest x-ray, and CT of the abdomen and pelvis.  The scans were obtained today, and all were unremarkable with no evidence of metastatic disease. The CT of the abdomen and pelvis did in fact confirm diffuse fatty infiltration of the liver, but no focal abnormalities in the liver or spleen.  I have called and spoken with Oscar to review these results. She is happy and relieved, and is comfortable with canceling her followup appointment here next week on August 26. That appointment was really just to review the scans. Otherwise, she will keep her followup appointment as scheduled in October 2014.  Zollie Scale, PA-C 07/28/2013

## 2013-08-01 ENCOUNTER — Other Ambulatory Visit: Payer: Self-pay | Admitting: Physician Assistant

## 2013-08-01 ENCOUNTER — Ambulatory Visit: Payer: Commercial Managed Care - PPO | Admitting: Physician Assistant

## 2013-08-02 ENCOUNTER — Encounter: Payer: Commercial Managed Care - PPO | Admitting: Physical Therapy

## 2013-08-04 ENCOUNTER — Telehealth: Payer: Self-pay | Admitting: Oncology

## 2013-09-19 ENCOUNTER — Other Ambulatory Visit (HOSPITAL_BASED_OUTPATIENT_CLINIC_OR_DEPARTMENT_OTHER): Payer: Commercial Managed Care - PPO | Admitting: Lab

## 2013-09-19 DIAGNOSIS — R748 Abnormal levels of other serum enzymes: Secondary | ICD-10-CM

## 2013-09-19 DIAGNOSIS — C50512 Malignant neoplasm of lower-outer quadrant of left female breast: Secondary | ICD-10-CM

## 2013-09-19 DIAGNOSIS — C50519 Malignant neoplasm of lower-outer quadrant of unspecified female breast: Secondary | ICD-10-CM

## 2013-09-19 LAB — CBC WITH DIFFERENTIAL/PLATELET
BASO%: 0.8 % (ref 0.0–2.0)
EOS%: 4.7 % (ref 0.0–7.0)
Eosinophils Absolute: 0.2 10*3/uL (ref 0.0–0.5)
LYMPH%: 25 % (ref 14.0–49.7)
MCHC: 32.9 g/dL (ref 31.5–36.0)
MCV: 97.6 fL (ref 79.5–101.0)
MONO#: 0.5 10*3/uL (ref 0.1–0.9)
MONO%: 11.2 % (ref 0.0–14.0)
NEUT#: 2.5 10*3/uL (ref 1.5–6.5)
NEUT%: 58.3 % (ref 38.4–76.8)
RBC: 4.41 10*6/uL (ref 3.70–5.45)
RDW: 13.7 % (ref 11.2–14.5)
WBC: 4.3 10*3/uL (ref 3.9–10.3)

## 2013-09-19 LAB — COMPREHENSIVE METABOLIC PANEL (CC13)
ALT: 62 U/L — ABNORMAL HIGH (ref 0–55)
AST: 50 U/L — ABNORMAL HIGH (ref 5–34)
Albumin: 3.7 g/dL (ref 3.5–5.0)
Alkaline Phosphatase: 93 U/L (ref 40–150)
Anion Gap: 9 mEq/L (ref 3–11)
Glucose: 115 mg/dl (ref 70–140)
Potassium: 4.2 mEq/L (ref 3.5–5.1)
Sodium: 143 mEq/L (ref 136–145)
Total Bilirubin: 0.57 mg/dL (ref 0.20–1.20)
Total Protein: 6.7 g/dL (ref 6.4–8.3)

## 2013-09-26 ENCOUNTER — Ambulatory Visit (HOSPITAL_BASED_OUTPATIENT_CLINIC_OR_DEPARTMENT_OTHER): Payer: Commercial Managed Care - PPO | Admitting: Oncology

## 2013-09-26 ENCOUNTER — Telehealth: Payer: Self-pay | Admitting: *Deleted

## 2013-09-26 VITALS — BP 126/86 | HR 105 | Temp 98.6°F | Resp 18 | Ht 68.0 in | Wt 162.2 lb

## 2013-09-26 DIAGNOSIS — C50519 Malignant neoplasm of lower-outer quadrant of unspecified female breast: Secondary | ICD-10-CM

## 2013-09-26 DIAGNOSIS — C50512 Malignant neoplasm of lower-outer quadrant of left female breast: Secondary | ICD-10-CM | POA: Insufficient documentation

## 2013-09-26 DIAGNOSIS — Z170 Estrogen receptor positive status [ER+]: Secondary | ICD-10-CM

## 2013-09-26 NOTE — Telephone Encounter (Signed)
appts made and printed...td 

## 2013-09-26 NOTE — Progress Notes (Signed)
ID: Sydney Cervantes   DOB: 09-Apr-1961  MR#: [id number]  JYN#:[id number]  GYN:  Debbora Dus, NP  SU:  Dr. Ovidio Kin  OTHER MD: Etter Sjogren, MD:  Hal Neer, MD  HISTORY OF PRESENT ILLNESS:  Sydney Cervantes is a 52 year old [place] woman with:   1. 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  ER/PR positive at 63/5% respectively, HER-2 negative, elevated Ki-67. Completed 4/6 planned neoadjuvant dose dense FEC with Neulasta support on day 2. 20% dose reduction on 5-FU, 50% dose reduction on epirubicin, 40% dose reduction on Cytoxan, after one-week delay due to anemia/thrombocytopenia/and region of probable localized thrombophlebitis versus mild chemotherapy extravasation on the left forearm, covered with Keflex 500 mg by mouth 3 times a day. Switched to single agent neoadjuvant Taxol, given 3 weeks on 1 week off, completed 4 cycles on 08/02/2012.   2. Status post left modified radical mastectomy on 08/25/2012 with final pathology showing no tumor in the primary tumor bed with 7 of 8 involved lymph nodes. The final pathology showed an ER positive tumor, HER-2 negative.  She completed radiation on 11/18/12 and started taking Tamoxifen on 11/21/2012.  It is also noted that she is BRCA negative.   Her subsequent history is as detailed below  INTERVAL HISTORY:  [name] returns today accompanied by her husband Sydney Cervantes for followup of her left breast carcinoma. She has been on tamoxifen now approximately one year. She is tolerating it well, with mild hot flashes as her chief side effect  REVIEW OF SYSTEMS: [name] is very pleased with her daughter concurrently here. Her energy is back as is her sense of taste. She goes to plan his business about once a week, but does you have the other days. She's not having any unusual hot flashes, visual changes, dizziness, gait imbalance, nausea or  vomiting, keeps a dry cough especially in the mornings, which she tells me she has had for decades, no change in bowel or bladder habits. Overall a detailed review of systems today was benign and certainly she gives me no symptoms suggestive of metastatic or recurrent disease   PAST MEDICAL HISTORY: Past Medical History  Diagnosis Date  . Hx of colonoscopy 2000  . Wears glasses   . Seasonal allergies     "when molds and things come out; pollens" (08/25/2012)  . Breast cancer     left  . Anemia     "chemo induced" (08/25/2012)  . GERD (gastroesophageal reflux disease)     occ; "side effect of chemo" (08/25/2012)  . Hx of radiation therapy 09/21/12 -11/17/12    R supraclav fossa, L chest wall/SCVL fossa/PAB mid axilla/IM nodes/scar boost    PAST SURGICAL HISTORY: Past Surgical History  Procedure Laterality Date  . 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 Portacath placement  02/11/2012    Procedure: INSERTION PORT-A-CATH;  Surgeon: Kandis Cocking, MD;  Location: [facility] OR;  Service: General;  Laterality: Right;  Subclavian  . Appendectomy  1987  . Cholecystectomy  2003  . Dilation and curettage of uterus  1992  . Mastectomy modified radical  08/25/2012    left  . Breast biopsy  01/2012    left    FAMILY HISTORY Family History  Problem Relation Age of Onset  . Breast cancer Mother   . Breast cancer Paternal Aunt   . Cancer Maternal Grandmother  Hodgkins Disease    GYNECOLOGIC HISTORY:  G3P2.  Menarche at 52 years of age.  Followed by Debbora Dus.    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 Scott works in Consulting civil engineer for [employer]. Daughter Sydney Cervantes currently 62 years old is a Buyer, retail in psychology and exercise physiology hoping to become an occupational therapist. Daughter Sydney Cervantes studded Investment banker, corporate at [employer] and hopes to being government. Daughter Sydney Cervantes is currently at [employer] education.    HEALTH MAINTENANCE: History  Substance Use Topics  . Smoking status: Never Smoker   . Smokeless tobacco: Never Used  . Alcohol Use: 0.0 oz/week    7-10 Glasses of wine per week     Comment: 06/26/2013 - 1-1.5 glasses of wine nightly    Allergies  Allergen Reactions  . Adhesive [Tape] Itching and Rash    Rash and itching at steristrip and EKG lead pad sites    Current Outpatient Prescriptions  Medication Sig Dispense Refill  . acetaminophen (TYLENOL) 325 MG tablet Take 650 mg by mouth every 6 (six) hours as needed. For pain      . Calcium Carbonate-Vitamin D (CALTRATE 600+D PO) Take 1 tablet by mouth daily.       Marland Kitchen ibuprofen (ADVIL,MOTRIN) 200 MG tablet Take 200-400 mg by mouth daily as needed. For pain      . nystatin (MYCOSTATIN) 100000 UNIT/ML suspension       . tamoxifen (NOLVADEX) 20 MG tablet Take 1 tablet (20 mg total) by mouth daily.  30 tablet  11   No current facility-administered medications for this visit.    OBJECTIVE: Middle-aged white female who appears well  Filed Vitals:   09/26/13 1134  BP: 126/86  Pulse: 105  Temp: 98.6 F (37 C)  Resp: 18     Body mass index is 24.67 kg/(m^2).    ECOG FS: 0  Sclerae unicteric, pupils equal and round Oropharynx no thrush or other lesions No cervical or supraclavicular adenopathy Lungs clear to auscultation bilaterally, good excursion bilaterally Heart regular rate and rhythm, no murmurs appreciated Abdomen soft, nontende, no hepatomegaly, positive bowel sounds MSK no focal spinal tenderness, no upper extremity lymphedema.  There is mild limited range of motion in the left shoulder Neuro: nonfocal, well oriented, positive affect Breasts: The right breast is unremarkable; the left breast is status post modified radical mastectomy; there is no evidence of local recurrence. The left axilla is benign  LAB  RESULTS: Lab Results  Component Value Date   WBC 4.3 09/19/2013   NEUTROABS 2.5 09/19/2013   HGB 14.2 09/19/2013   HCT 43.0 09/19/2013   MCV 97.6 09/19/2013   PLT 143* 09/19/2013      Chemistry      Component Value Date/Time   NA 143 09/19/2013 1303   NA 142 08/19/2012 1506   K 4.2 09/19/2013 1303   K 4.3 08/19/2012 1506   CL 108* 03/20/2013 1438   CL 106 08/19/2012 1506   CO2 28 09/19/2013 1303   CO2 26 08/19/2012 1506   BUN 13.7 09/19/2013 1303   BUN 10 08/19/2012 1506   CREATININE 0.9 09/19/2013 1303   CREATININE 0.78 08/25/2012 1336      Component Value Date/Time   CALCIUM 10.1 09/19/2013 1303   CALCIUM 9.5 08/19/2012 1506   ALKPHOS 93 09/19/2013 1303   ALKPHOS 62  06/21/2012 1259   AST 50* 09/19/2013 1303   AST 42* 06/21/2012 1259   ALT 62* 09/19/2013 1303   ALT 45* 06/21/2012 1259   BILITOT 0.57 09/19/2013 1303   BILITOT 0.5 06/21/2012 1259       STUDIES:   07/28/2013 CT ABDOMEN AND PELVIS WITH CONTRAST  Technique: Multidetector CT imaging of the abdomen and pelvis was  performed following the standard protocol during bolus  administration of intravenous contrast.  Contrast: OMNIPAQUE IOHEXOL 300 MG/ML SOLN  Comparison: PET CT from 08/10/2012  Findings: Imaging through the lower chest shows the patient to be  status post left mastectomy. The visualized portions of the lower  lungs are unremarkable.  The liver is diffusely fatty. No focal abnormalities seen in the  liver or spleen. The stomach, duodenum, pancreas, and adrenal  glands are unremarkable. Gallbladder is surgically absent. No  focal abnormalities seen in either kidney.  No abdominal aortic aneurysm. There is no free fluid or  lymphadenopathy in the abdomen.  Imaging through the pelvis shows no free intraperitoneal fluid. No  pelvic sidewall lymphadenopathy. Uterus is unremarkable. No  adnexal mass.  There is some diverticular change in the left colon without  diverticulitis. Terminal ileum is  normal. The appendix is not  visualized, but there is no edema or inflammation in the region of  the cecum.  Bone windows reveal no worrisome lytic or sclerotic osseous  lesions.  IMPRESSION:  No acute findings in the abdomen or pelvis. Specifically, no  evidence for metastatic disease.  Diffuse fatty infiltration of the liver. No intra or extrahepatic  biliary duct dilatation.  Original Report Authenticated By: Kennith Center, M.D.   CHEST 2 VIEW  COMPARISON: PET-CT 08/10/2012. Chest radiographs 03/16/2012.  FINDINGS:  Right chest port catheter has been removed. Stable lung volumes.  Normal cardiac size and mediastinal contours. Visualized tracheal  air column is within normal limits. Interval left mastectomy.  Surgical clips at the left axilla. Stable right upper quadrant  surgical clips. Stable visualized osseous structures. The lungs  appear stable and clear. There is CT type oral contrast visible in  nondilated bowel in the upper abdomen.  IMPRESSION:  No acute or metastatic cardiopulmonary abnormality.  Electronically Signed  By: Augusto Gamble   ASSESSMENT:  52 y.o. BRCA negative [place] woman  (1) s/p left breast and left axillary lymph node biopsies every 01/18/2012 for a clinical T4 N3, stage IIIC invasive lobular carcinoma, grade 3, estrogen receptor 62% positive, progesterone receptor 5% positive, with an MIB-1 of 33% and no HER-2 amplification.  (2) Began chemotherapy in March 2013, and received 4/6 planned neoadjuvant cycles of dose dense cyclophosphamide, epirubicin and fluorouracil followed by weekly paclitaxel x 12 completed Taxol 08/02/2012.   (3) left modified radical mastectomy 08/25/2012 showed no residual tumor in the breast, but 7/8 sampled lymph nodes were positive, 6 with macro metastases. Repeat HER-2 was again negative.  (4) She completed radiation on 11/18/12   (5) started tamoxifen on 11/21/2012.  (6) planned left breast reconstruction is  pending  PLAN: Schelly is doing well, now a little over a year from her definitive surgery with no evidence of disease recurrence. The studies we obtained in August are very reassuring. We discussed "fatty liver" at length today, and while it is true that tamoxifen can affect her liver, my recommendation if she wants to do something regarding fatty liver is to exercise 45 minutes 5 times a week and then perhaps beginning next year consider a low carbohydrate  diet.  She is going to see me again in January of next year. If she sees Dr. Ezzard Standing in April she can see [place] again in July.  The overall plan is to continue tamoxifen at least to December of 2015, at which time we can consider switching to an aromatase inhibitor. An alternative plan would be to continue tamoxifen for 5 years and then switch Orene has a good understanding of all this. She knows to call for any problems that may develop before her next visit here.  MAGRINAT,GUSTAV C    09/26/2013

## 2013-10-12 ENCOUNTER — Other Ambulatory Visit: Payer: Self-pay

## 2013-11-20 ENCOUNTER — Other Ambulatory Visit: Payer: Self-pay | Admitting: *Deleted

## 2013-11-20 DIAGNOSIS — C50519 Malignant neoplasm of lower-outer quadrant of unspecified female breast: Secondary | ICD-10-CM

## 2013-11-20 MED ORDER — TAMOXIFEN CITRATE 20 MG PO TABS: 20.0000 mg | ORAL_TABLET | Freq: Every day | ORAL | Status: DC

## 2013-12-20 ENCOUNTER — Other Ambulatory Visit (HOSPITAL_BASED_OUTPATIENT_CLINIC_OR_DEPARTMENT_OTHER): Payer: Commercial Managed Care - PPO

## 2013-12-20 DIAGNOSIS — C50519 Malignant neoplasm of lower-outer quadrant of unspecified female breast: Secondary | ICD-10-CM

## 2013-12-20 DIAGNOSIS — C50512 Malignant neoplasm of lower-outer quadrant of left female breast: Secondary | ICD-10-CM

## 2013-12-20 LAB — CBC WITH DIFFERENTIAL/PLATELET
BASO%: 0.9 % (ref 0.0–2.0)
Basophils Absolute: 0 10*3/uL (ref 0.0–0.1)
EOS ABS: 0.3 10*3/uL (ref 0.0–0.5)
EOS%: 9.4 % — ABNORMAL HIGH (ref 0.0–7.0)
HCT: 39.9 % (ref 34.8–46.6)
HGB: 13.3 g/dL (ref 11.6–15.9)
LYMPH#: 1 10*3/uL (ref 0.9–3.3)
LYMPH%: 28.6 % (ref 14.0–49.7)
MCH: 32.9 pg (ref 25.1–34.0)
MCHC: 33.2 g/dL (ref 31.5–36.0)
MCV: 98.9 fL (ref 79.5–101.0)
MONO#: 0.4 10*3/uL (ref 0.1–0.9)
MONO%: 11.6 % (ref 0.0–14.0)
NEUT%: 49.5 % (ref 38.4–76.8)
NEUTROS ABS: 1.8 10*3/uL (ref 1.5–6.5)
Platelets: 174 10*3/uL (ref 145–400)
RBC: 4.03 10*6/uL (ref 3.70–5.45)
RDW: 13.5 % (ref 11.2–14.5)
WBC: 3.7 10*3/uL — ABNORMAL LOW (ref 3.9–10.3)

## 2013-12-20 LAB — COMPREHENSIVE METABOLIC PANEL (CC13)
ALBUMIN: 3.6 g/dL (ref 3.5–5.0)
ALT: 42 U/L (ref 0–55)
AST: 37 U/L — AB (ref 5–34)
Alkaline Phosphatase: 108 U/L (ref 40–150)
Anion Gap: 12 mEq/L — ABNORMAL HIGH (ref 3–11)
BUN: 12.1 mg/dL (ref 7.0–26.0)
CALCIUM: 9.6 mg/dL (ref 8.4–10.4)
CHLORIDE: 109 meq/L (ref 98–109)
CO2: 26 mEq/L (ref 22–29)
Creatinine: 0.8 mg/dL (ref 0.6–1.1)
GLUCOSE: 97 mg/dL (ref 70–140)
POTASSIUM: 4.5 meq/L (ref 3.5–5.1)
Sodium: 147 mEq/L — ABNORMAL HIGH (ref 136–145)
Total Bilirubin: 0.44 mg/dL (ref 0.20–1.20)
Total Protein: 6.5 g/dL (ref 6.4–8.3)

## 2013-12-27 ENCOUNTER — Encounter: Payer: Self-pay | Admitting: Physician Assistant

## 2013-12-27 ENCOUNTER — Ambulatory Visit (HOSPITAL_BASED_OUTPATIENT_CLINIC_OR_DEPARTMENT_OTHER): Payer: Commercial Managed Care - PPO | Admitting: Physician Assistant

## 2013-12-27 ENCOUNTER — Telehealth: Payer: Self-pay | Admitting: Oncology

## 2013-12-27 VITALS — BP 129/88 | HR 96 | Temp 97.9°F | Resp 18 | Ht 68.0 in | Wt 161.0 lb

## 2013-12-27 DIAGNOSIS — C50919 Malignant neoplasm of unspecified site of unspecified female breast: Secondary | ICD-10-CM

## 2013-12-27 DIAGNOSIS — Z1231 Encounter for screening mammogram for malignant neoplasm of breast: Secondary | ICD-10-CM

## 2013-12-27 DIAGNOSIS — Z853 Personal history of malignant neoplasm of breast: Secondary | ICD-10-CM

## 2013-12-27 DIAGNOSIS — C773 Secondary and unspecified malignant neoplasm of axilla and upper limb lymph nodes: Secondary | ICD-10-CM

## 2013-12-27 DIAGNOSIS — K760 Fatty (change of) liver, not elsewhere classified: Secondary | ICD-10-CM

## 2013-12-27 DIAGNOSIS — R748 Abnormal levels of other serum enzymes: Secondary | ICD-10-CM

## 2013-12-27 DIAGNOSIS — C779 Secondary and unspecified malignant neoplasm of lymph node, unspecified: Secondary | ICD-10-CM

## 2013-12-27 DIAGNOSIS — C50512 Malignant neoplasm of lower-outer quadrant of left female breast: Secondary | ICD-10-CM

## 2013-12-27 HISTORY — DX: Fatty (change of) liver, not elsewhere classified: K76.0

## 2013-12-27 NOTE — Progress Notes (Signed)
ID: Sydney Cervantes   DOB: [birth year]  MR#: [id number]  OIN#:[id number]  GYN:  [name], NP  SU:  Dr. Alphonsa Overall  OTHER MD: Crissie Reese, MD:  Lance Morin, MD  CHIEF COMPLAINT:  Hx of Left Breast Cancer   HISTORY OF PRESENT ILLNESS:  Sydney Cervantes is  [facility] woman with:   1. 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  ER/PR positive at 63/5% respectively, HER-2 negative, elevated Ki-67. Completed 4/6 planned neoadjuvant dose dense FEC with Neulasta support on day 2. 20% dose reduction on 5-FU, 50% dose reduction on epirubicin, 40% dose reduction on Cytoxan, after one-week delay due to anemia/thrombocytopenia/and region of probable localized thrombophlebitis versus mild chemotherapy extravasation on the left forearm, covered with Keflex 500 mg by mouth 3 times a day. Switched to single agent neoadjuvant Taxol, given 3 weeks on 1 week off, completed 4 cycles on 08/02/2012.   2. Status post left modified radical mastectomy on 08/25/2012 with final pathology showing no tumor in the primary tumor bed with 7 of 8 involved lymph nodes. The final pathology showed an ER positive tumor, HER-2 negative.  She completed radiation on 11/18/12 and started taking Tamoxifen on 11/21/2012.  It is also noted that she is BRCA negative.   Her subsequent history is as detailed below  INTERVAL HISTORY:  Sydney Cervantes returns today accompanied by her husband [name] for followup of her left breast carcinoma. She continues on tamoxifen with good tolerance. She has only occasional hot flashes which are not particularly problematic. They do not wake her up at night. She's had no problems with vaginal dryness or discharge, and denies any vaginal bleeding. She does have some slight change in vision (basically her glasses "don't work as well"), and is due for an eye exam.   Interval history is notable for the  fact that Sydney Cervantes has been scheduled for her latissimus flap reconstruction of the left breast under the care of Dr. [name] on February 5. She's anxious to have this behind her, and is "ready to be finished".   REVIEW OF SYSTEMS: [name] has had no recent illnesses and denies any fevers or chills. Her energy level is good. She's had no rashes or skin changes. She does bruise easily. Her appetite is good and she's had no nausea or change in bowel or bladder habits. She has a chronic dry cough which is stable. She denies any phlegm production, pleurisy, shortness of breath, chest pain, or palpitations. She's had no abnormal headaches or dizziness. She denies any unusual myalgias, arthralgias, or bony pain, and has had no peripheral swelling.  A detailed review of systems is otherwise stable and noncontributory.   PAST MEDICAL HISTORY: Past Medical History  Diagnosis Date  . Hx of colonoscopy 2000  . Wears glasses   . Seasonal allergies     "when molds and things come out; pollens" (08/25/2012)  . Breast cancer     left  . Anemia     "chemo induced" (08/25/2012)  . GERD (gastroesophageal reflux disease)     occ; "side effect of chemo" (08/25/2012)  . Hx of radiation therapy 09/21/12 -11/17/12    R supraclav fossa, L chest wall/SCVL fossa/PAB mid axilla/IM nodes/scar boost  . Hepatic steatosis 12/27/2013    PAST SURGICAL HISTORY: Past Surgical History  Procedure Laterality Date  . 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 Portacath  placement  02/11/2012    Procedure: INSERTION PORT-A-CATH;  Surgeon: Shann Medal, MD;  Location: [facility];  Service: General;  Laterality: Right;  Subclavian  . Appendectomy  1987  . Cholecystectomy  2003  . Dilation and curettage of uterus  1992  . Mastectomy modified radical  08/25/2012    left  . Breast biopsy  01/2012    left    FAMILY HISTORY Family History  Problem Relation Age of Onset  . Breast cancer Mother   . Breast cancer Paternal  Aunt   . Cancer Maternal Grandmother     Hodgkins Disease    GYNECOLOGIC HISTORY:   G3P2.  Menarche at 53 years of age.  Followed by [name], NP.    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 Scott works in Engineer, technical sales for [employer]. Daughter Joellen Jersey currently 72 years old is a Environmental education officer in psychology and exercise physiology hoping to become an occupational therapist. Daughter Raquel Sarna studied Therapist, occupational at [employer]	[place] and hopes to be in [place]. Daughter Burman Nieves is currently at [employer] education.    HEALTH MAINTENANCE:  (Updated January 2015) History  Substance Use Topics  . Smoking status: Never Smoker   . Smokeless tobacco: Never Used  . Alcohol Use: 0.0 oz/week    7-10 Glasses of wine per week     Comment: 06/26/2013 - 1-1.5 glasses of wine nightly     COLONOSCOPY: Approx 2000  PAP:  Not on file  BONE DENSITY:  Never  LIPIDS:  Not on file     Allergies  Allergen Reactions  . Adhesive [Tape] Itching and Rash    Rash and itching at steristrip and EKG lead pad sites    Current Outpatient Prescriptions  Medication Sig Dispense Refill  . Calcium Carbonate-Vitamin D (CALTRATE 600+D PO) Take 1 tablet by mouth daily.       . tamoxifen (NOLVADEX) 20 MG tablet Take 1 tablet (20 mg total) by mouth daily.  90 tablet  3  . acetaminophen (TYLENOL) 325 MG tablet Take 650 mg by mouth every 6 (six) hours as needed. For pain      . AFLURIA PRESERVATIVE FREE injection       . ibuprofen (ADVIL,MOTRIN) 200 MG tablet Take 200-400 mg by mouth daily as needed. For pain      . trimethoprim-polymyxin b (POLYTRIM) ophthalmic solution        No current facility-administered medications for this visit.    OBJECTIVE: Middle-aged white female who appears well and is in no acute distress Filed Vitals:    12/27/13 1146  BP: 129/88  Pulse: 96  Temp: 97.9 F (36.6 C)  Resp: 18     Body mass index is 24.49 kg/(m^2).    ECOG FS: 0 Filed Weights   12/27/13 1146  Weight: 161 lb (73.029 kg)   Physical Exam: HEENT:  Sclerae anicteric.  Oropharynx clear.  Buccal mucosa pink and moist. Neck supple, trachea midline. NODES:  No cervical or supraclavicular lymphadenopathy palpated.  BREAST EXAM:  Right breast is unremarkable. Patient status post left mastectomy. No suspicious nodularity or skin changes, and no evidence of local recurrence. Axillae are benign bilaterally, no palpable lymphadenopathy. LUNGS:  Clear to auscultation bilaterally.  No wheezes or rhonchi HEART:  Regular rate and rhythm. No murmur appreciated ABDOMEN:  Soft, nontender.  Positive bowel sounds.  MSK:  No focal spinal tenderness to palpation. Full range of motion bilaterally in the upper extremities. EXTREMITIES:  No peripheral edema.   SKIN:  Benign with no visible rashes or skin lesions. No pallor. No nail dyscrasia. No ecchymoses or petechiae. NEURO:  Nonfocal. Well oriented.  Appropriate affect.    LAB RESULTS: Lab Results  Component Value Date   WBC 3.7* 12/20/2013   NEUTROABS 1.8 12/20/2013   HGB 13.3 12/20/2013   HCT 39.9 12/20/2013   MCV 98.9 12/20/2013   PLT 174 12/20/2013      Chemistry      Component Value Date/Time   NA 147* 12/20/2013 1058   NA 142 08/19/2012 1506   K 4.5 12/20/2013 1058   K 4.3 08/19/2012 1506   CL 108* 03/20/2013 1438   CL 106 08/19/2012 1506   CO2 26 12/20/2013 1058   CO2 26 08/19/2012 1506   BUN 12.1 12/20/2013 1058   BUN 10 08/19/2012 1506   CREATININE 0.8 12/20/2013 1058   CREATININE 0.78 08/25/2012 1336      Component Value Date/Time   CALCIUM 9.6 12/20/2013 1058   CALCIUM 9.5 08/19/2012 1506   ALKPHOS 108 12/20/2013 1058   ALKPHOS 62 06/21/2012 1259   AST 37* 12/20/2013 1058   AST 42* 06/21/2012 1259   ALT 42 12/20/2013 1058   ALT 45* 06/21/2012 1259   BILITOT 0.44 12/20/2013 1058    BILITOT 0.5 06/21/2012 1259       STUDIES:  Most recent right mammogram at [facility] on 05/17/2013 was unremarkable.   ASSESSMENT:  53 y.o. BRCA negative [place] woman  (1) s/p left breast and left axillary lymph node biopsies every 01/18/2012 for a clinical T4 N3, stage IIIC invasive lobular carcinoma, grade 3, estrogen receptor 62% positive, progesterone receptor 5% positive, with an MIB-1 of 33% and no HER-2 amplification.  (2) Began chemotherapy in March 2013, and received 4/6 planned neoadjuvant cycles of dose dense cyclophosphamide, epirubicin and fluorouracil followed by weekly paclitaxel x 12 completed Taxol 08/02/2012.   (3) left modified radical mastectomy 08/25/2012 showed no residual tumor in the breast, but 7/8 sampled lymph nodes were positive, 6 with macro metastases. Repeat HER-2 was again negative.  (4) She completed radiation on 11/18/12   (5) started tamoxifen on 11/21/2012.  (6) planned left breast reconstruction, scheduled 01/11/2014 with Dr. [name]  PLAN: Allex is doing well with regards to her breast cancer, and there is no clinical evidence of disease recurrence. She is tolerating the tamoxifen very well and will continue as before at 20 mg daily. Our plan is to continue tamoxifen until at least December of 2015. At that time we will discuss either continuing the tamoxifen, or switching to an aromatase inhibitor.  Corine will be due for her next followup appointment with Dr. Lucia Gaskins in April. She will have her right mammogram at [facility] in June, and we'll see [place] again for routine followup in July. All this was reviewed in detail. She voices understanding and agreement, and as always knows to call any changes or problems prior to her next appointment.   Dezi Brauner PA-C     12/27/2013

## 2013-12-29 ENCOUNTER — Encounter (HOSPITAL_COMMUNITY): Payer: Self-pay | Admitting: Pharmacy Technician

## 2013-12-29 ENCOUNTER — Other Ambulatory Visit: Payer: Self-pay | Admitting: *Deleted

## 2013-12-29 DIAGNOSIS — C50512 Malignant neoplasm of lower-outer quadrant of left female breast: Secondary | ICD-10-CM

## 2014-01-02 ENCOUNTER — Encounter (HOSPITAL_COMMUNITY)
Admission: RE | Admit: 2014-01-02 | Discharge: 2014-01-02 | Disposition: A | Discharge status: Home / Self Care | Payer: Commercial Managed Care - PPO | Source: Ambulatory Visit | Attending: Plastic Surgery | Admitting: Plastic Surgery

## 2014-01-02 ENCOUNTER — Ambulatory Visit: Payer: Commercial Managed Care - PPO | Attending: Physician Assistant | Admitting: Physical Therapy

## 2014-01-02 ENCOUNTER — Other Ambulatory Visit: Payer: Self-pay | Admitting: Plastic Surgery

## 2014-01-02 ENCOUNTER — Encounter (HOSPITAL_COMMUNITY): Payer: Self-pay

## 2014-01-02 ENCOUNTER — Ambulatory Visit: Payer: Commercial Managed Care - PPO | Admitting: Physical Therapy

## 2014-01-02 DIAGNOSIS — C50919 Malignant neoplasm of unspecified site of unspecified female breast: Secondary | ICD-10-CM | POA: Insufficient documentation

## 2014-01-02 DIAGNOSIS — Z01818 Encounter for other preprocedural examination: Secondary | ICD-10-CM | POA: Insufficient documentation

## 2014-01-02 DIAGNOSIS — IMO0001 Reserved for inherently not codable concepts without codable children: Secondary | ICD-10-CM | POA: Insufficient documentation

## 2014-01-02 DIAGNOSIS — C773 Secondary and unspecified malignant neoplasm of axilla and upper limb lymph nodes: Secondary | ICD-10-CM | POA: Insufficient documentation

## 2014-01-02 DIAGNOSIS — I890 Lymphedema, not elsewhere classified: Secondary | ICD-10-CM | POA: Insufficient documentation

## 2014-01-02 DIAGNOSIS — Z01812 Encounter for preprocedural laboratory examination: Secondary | ICD-10-CM | POA: Insufficient documentation

## 2014-01-02 LAB — CBC
HCT: 40.7 % (ref 36.0–46.0)
Hemoglobin: 13.6 g/dL (ref 12.0–15.0)
MCH: 32.8 pg (ref 26.0–34.0)
MCHC: 33.4 g/dL (ref 30.0–36.0)
MCV: 98.1 fL (ref 78.0–100.0)
PLATELETS: 138 10*3/uL — AB (ref 150–400)
RBC: 4.15 MIL/uL (ref 3.87–5.11)
RDW: 13.3 % (ref 11.5–15.5)
WBC: 5.4 10*3/uL (ref 4.0–10.5)

## 2014-01-02 LAB — BASIC METABOLIC PANEL
BUN: 14 mg/dL (ref 6–23)
CALCIUM: 9.4 mg/dL (ref 8.4–10.5)
CO2: 26 meq/L (ref 19–32)
Chloride: 105 mEq/L (ref 96–112)
Creatinine, Ser: 0.73 mg/dL (ref 0.50–1.10)
GFR calc Af Amer: >90 mL/min (ref >90)
GFR calc non Af Amer: >90 mL/min (ref >90)
Glucose, Bld: 91 mg/dL (ref 70–99)
POTASSIUM: 4.5 meq/L (ref 3.7–5.3)
SODIUM: 145 meq/L (ref 137–147)

## 2014-01-02 NOTE — Pre-Procedure Instructions (Signed)
FIDELIA CATHERS  01/02/2014   Your procedure is scheduled on:  Thursday, February 5.  Report to [facility], Main Entrance [facility]" at 5:30 AM.  Call this number if you have problems the morning of surgery: [phone]   Remember:   Do not eat food or drink liquids after midnight.   Take these medicines the morning of surgery with A SIP OF WATER: tamoxifen (NOLVADEX). May take acetaminophen (TYLENOL) if needed.  Stop taking vitamins and Ibuprofen January  29.     Do not wear jewelry, make-up or nail polish.  Do not wear lotions, powders, or perfumes. You may wear deodorant.  Do not shave 48 hours prior to surgery. Men may shave face and neck.  Do not bring valuables to the hospital.  [facility] is not responsible   for any belongings or valuables.               Contacts, dentures or bridgework may not be worn into surgery.  Leave suitcase in the car. After surgery it may be brought to your room.  For patients admitted to the hospital, discharge time is determined by your treatment team.               Patients discharged the day of surgery will not be allowed to drive home.  Name and phone number of your driver: -   Special Instructions: Shower using CHG 2 nights before surgery and the night before surgery.  If you shower the day of surgery use CHG.  Use special wash - you have one bottle of CHG for all showers.  You should use approximately 1/3 of the bottle for each shower.   Please read over the following fact sheets that you were given: Pain Booklet, Coughing and Deep Breathing and Surgical Site Infection Prevention

## 2014-01-03 ENCOUNTER — Ambulatory Visit: Payer: Commercial Managed Care - PPO

## 2014-01-05 ENCOUNTER — Ambulatory Visit: Payer: Commercial Managed Care - PPO | Admitting: Physical Therapy

## 2014-01-08 ENCOUNTER — Ambulatory Visit: Payer: Commercial Managed Care - PPO | Attending: Physician Assistant

## 2014-01-08 DIAGNOSIS — I890 Lymphedema, not elsewhere classified: Secondary | ICD-10-CM | POA: Insufficient documentation

## 2014-01-08 DIAGNOSIS — C50919 Malignant neoplasm of unspecified site of unspecified female breast: Secondary | ICD-10-CM | POA: Insufficient documentation

## 2014-01-08 DIAGNOSIS — C773 Secondary and unspecified malignant neoplasm of axilla and upper limb lymph nodes: Secondary | ICD-10-CM | POA: Insufficient documentation

## 2014-01-08 DIAGNOSIS — IMO0001 Reserved for inherently not codable concepts without codable children: Secondary | ICD-10-CM | POA: Insufficient documentation

## 2014-01-10 ENCOUNTER — Ambulatory Visit: Payer: Commercial Managed Care - PPO

## 2014-01-10 MED ORDER — CHLORHEXIDINE GLUCONATE 4 % EX LIQD: 1.0000 "application " | Freq: Once | CUTANEOUS | Status: DC

## 2014-01-10 MED ORDER — HEPARIN SODIUM (PORCINE) 5000 UNIT/ML IJ SOLN
5000.0000 [IU] | Freq: Once | INTRAMUSCULAR | Status: AC
  Administered 2014-01-11: 5000 [IU] via SUBCUTANEOUS
  Filled 2014-01-10: qty 1

## 2014-01-10 MED ORDER — CEFAZOLIN SODIUM-DEXTROSE 2-3 GM-% IV SOLR
2.0000 g | INTRAVENOUS | Status: AC
  Administered 2014-01-11 (×2): 2 g via INTRAVENOUS
  Filled 2014-01-10: qty 50

## 2014-01-11 ENCOUNTER — Inpatient Hospital Stay (HOSPITAL_COMMUNITY): Payer: Commercial Managed Care - PPO | Admitting: Anesthesiology

## 2014-01-11 ENCOUNTER — Encounter (HOSPITAL_COMMUNITY): Payer: Commercial Managed Care - PPO | Admitting: Anesthesiology

## 2014-01-11 ENCOUNTER — Encounter (HOSPITAL_COMMUNITY)
Admission: RE | Disposition: A | Discharge status: Home / Self Care | Payer: Self-pay | Source: Ambulatory Visit | Attending: Plastic Surgery

## 2014-01-11 ENCOUNTER — Encounter (HOSPITAL_COMMUNITY): Payer: Self-pay | Admitting: *Deleted

## 2014-01-11 ENCOUNTER — Inpatient Hospital Stay (HOSPITAL_COMMUNITY)
Admission: RE | Admit: 2014-01-11 | Discharge: 2014-01-13 | DRG: 583 | Disposition: A | Discharge status: Home / Self Care | Payer: Commercial Managed Care - PPO | Source: Ambulatory Visit | Attending: Plastic Surgery | Admitting: Plastic Surgery

## 2014-01-11 DIAGNOSIS — C50919 Malignant neoplasm of unspecified site of unspecified female breast: Principal | ICD-10-CM | POA: Diagnosis present

## 2014-01-11 DIAGNOSIS — N62 Hypertrophy of breast: Secondary | ICD-10-CM | POA: Diagnosis present

## 2014-01-11 HISTORY — PX: LATISSIMUS FLAP TO BREAST: SHX5357

## 2014-01-11 HISTORY — PX: BREAST REDUCTION SURGERY: SHX8

## 2014-01-11 SURGERY — RECONSTRUCTION, BREAST, USING LATISSIMUS DORSI MYOCUTANEOUS FLAP
Anesthesia: General | Site: Breast | Laterality: Right

## 2014-01-11 MED ORDER — SODIUM CHLORIDE 0.9 % IJ SOLN: 9.0000 mL | INTRAMUSCULAR | Status: DC | PRN

## 2014-01-11 MED ORDER — MIDAZOLAM HCL 5 MG/5ML IJ SOLN
INTRAMUSCULAR | Status: DC | PRN
  Administered 2014-01-11: 2 mg via INTRAVENOUS

## 2014-01-11 MED ORDER — SODIUM CHLORIDE 0.9 % IV SOLN
Freq: Once | INTRAVENOUS | Status: DC
  Filled 2014-01-11: qty 1

## 2014-01-11 MED ORDER — ACETAMINOPHEN 325 MG PO TABS
650.0000 mg | ORAL_TABLET | Freq: Four times a day (QID) | ORAL | Status: DC | PRN
  Administered 2014-01-12 – 2014-01-13 (×3): 650 mg via ORAL
  Filled 2014-01-11 (×3): qty 2

## 2014-01-11 MED ORDER — HYDROMORPHONE HCL PF 1 MG/ML IJ SOLN
INTRAMUSCULAR | Status: AC
  Filled 2014-01-11: qty 1

## 2014-01-11 MED ORDER — LIDOCAINE HCL (CARDIAC) 20 MG/ML IV SOLN
INTRAVENOUS | Status: AC
  Filled 2014-01-11: qty 5

## 2014-01-11 MED ORDER — ACETAMINOPHEN 160 MG/5ML PO SOLN: 325.0000 mg | ORAL | Status: DC | PRN

## 2014-01-11 MED ORDER — MIDAZOLAM HCL 2 MG/2ML IJ SOLN
INTRAMUSCULAR | Status: AC
  Filled 2014-01-11: qty 2

## 2014-01-11 MED ORDER — MORPHINE SULFATE 10 MG/ML IJ SOLN
INTRAMUSCULAR | Status: DC | PRN
  Administered 2014-01-11 (×2): 5 mg via INTRAVENOUS

## 2014-01-11 MED ORDER — GLYCOPYRROLATE 0.2 MG/ML IJ SOLN
INTRAMUSCULAR | Status: DC | PRN
  Administered 2014-01-11: .7 mg via INTRAVENOUS

## 2014-01-11 MED ORDER — PROMETHAZINE HCL 25 MG/ML IJ SOLN: 6.2500 mg | INTRAMUSCULAR | Status: DC | PRN

## 2014-01-11 MED ORDER — CEFAZOLIN SODIUM-DEXTROSE 2-3 GM-% IV SOLR
INTRAVENOUS | Status: AC
  Filled 2014-01-11: qty 50

## 2014-01-11 MED ORDER — ONDANSETRON HCL 4 MG/2ML IJ SOLN
INTRAMUSCULAR | Status: AC
Start: 2014-01-11 — End: 2014-01-11
  Filled 2014-01-11: qty 2

## 2014-01-11 MED ORDER — DIPHENHYDRAMINE HCL 12.5 MG/5ML PO ELIX: 12.5000 mg | ORAL_SOLUTION | Freq: Four times a day (QID) | ORAL | Status: DC | PRN

## 2014-01-11 MED ORDER — CEFAZOLIN SODIUM 1-5 GM-% IV SOLN
1.0000 g | Freq: Three times a day (TID) | INTRAVENOUS | Status: DC
Start: 2014-01-11 — End: 2014-01-13
  Administered 2014-01-11 – 2014-01-13 (×5): 1 g via INTRAVENOUS
  Filled 2014-01-11 (×8): qty 50

## 2014-01-11 MED ORDER — ONDANSETRON HCL 4 MG/2ML IJ SOLN
INTRAMUSCULAR | Status: DC | PRN
  Administered 2014-01-11: 4 mg via INTRAVENOUS

## 2014-01-11 MED ORDER — NALOXONE HCL 0.4 MG/ML IJ SOLN: 0.4000 mg | INTRAMUSCULAR | Status: DC | PRN

## 2014-01-11 MED ORDER — HYDROMORPHONE 0.3 MG/ML IV SOLN
INTRAVENOUS | Status: AC
  Administered 2014-01-11: 14:00:00
  Filled 2014-01-11: qty 25

## 2014-01-11 MED ORDER — NEOSTIGMINE METHYLSULFATE 1 MG/ML IJ SOLN
INTRAMUSCULAR | Status: DC | PRN
  Administered 2014-01-11: 4 mg via INTRAVENOUS

## 2014-01-11 MED ORDER — HYDROMORPHONE 0.3 MG/ML IV SOLN
INTRAVENOUS | Status: DC
  Administered 2014-01-12: 0.2 mg via INTRAVENOUS
  Administered 2014-01-12: 0.199 mg via INTRAVENOUS
  Administered 2014-01-12: 0.2 mg via INTRAVENOUS

## 2014-01-11 MED ORDER — LIDOCAINE HCL (CARDIAC) 20 MG/ML IV SOLN
INTRAVENOUS | Status: DC | PRN
  Administered 2014-01-11: 60 mg via INTRAVENOUS

## 2014-01-11 MED ORDER — GLYCOPYRROLATE 0.2 MG/ML IJ SOLN
INTRAMUSCULAR | Status: AC
  Filled 2014-01-11: qty 3

## 2014-01-11 MED ORDER — DOCUSATE SODIUM 100 MG PO CAPS
100.0000 mg | ORAL_CAPSULE | Freq: Every day | ORAL | Status: DC
  Administered 2014-01-11 – 2014-01-13 (×3): 100 mg via ORAL
  Filled 2014-01-11 (×3): qty 1

## 2014-01-11 MED ORDER — DEXTROSE-NACL 5-0.45 % IV SOLN
INTRAVENOUS | Status: DC
  Administered 2014-01-11 – 2014-01-12 (×3): via INTRAVENOUS

## 2014-01-11 MED ORDER — DIPHENHYDRAMINE HCL 50 MG/ML IJ SOLN: 12.5000 mg | Freq: Four times a day (QID) | INTRAMUSCULAR | Status: DC | PRN

## 2014-01-11 MED ORDER — ACETAMINOPHEN 325 MG PO TABS: 325.0000 mg | ORAL_TABLET | ORAL | Status: DC | PRN

## 2014-01-11 MED ORDER — PROPOFOL 10 MG/ML IV BOLUS
INTRAVENOUS | Status: DC | PRN
  Administered 2014-01-11: 140 mg via INTRAVENOUS
  Administered 2014-01-11: 30 mg via INTRAVENOUS

## 2014-01-11 MED ORDER — 0.9 % SODIUM CHLORIDE (POUR BTL) OPTIME
TOPICAL | Status: DC | PRN
  Administered 2014-01-11 (×3): 1000 mL

## 2014-01-11 MED ORDER — FENTANYL CITRATE 0.05 MG/ML IJ SOLN
INTRAMUSCULAR | Status: DC | PRN
  Administered 2014-01-11: 150 ug via INTRAVENOUS
  Administered 2014-01-11: 100 ug via INTRAVENOUS

## 2014-01-11 MED ORDER — ONDANSETRON HCL 4 MG/2ML IJ SOLN
4.0000 mg | Freq: Four times a day (QID) | INTRAMUSCULAR | Status: DC | PRN
  Administered 2014-01-11: 4 mg via INTRAVENOUS
  Filled 2014-01-11: qty 2

## 2014-01-11 MED ORDER — LACTATED RINGERS IV SOLN
INTRAVENOUS | Status: DC | PRN
  Administered 2014-01-11 (×3): via INTRAVENOUS

## 2014-01-11 MED ORDER — SODIUM CHLORIDE 0.9 % IV SOLN
INTRAVENOUS | Status: DC | PRN
  Administered 2014-01-11: 11:00:00

## 2014-01-11 MED ORDER — KETOROLAC TROMETHAMINE 30 MG/ML IJ SOLN: 15.0000 mg | Freq: Once | INTRAMUSCULAR | Status: DC | PRN

## 2014-01-11 MED ORDER — PROPOFOL 10 MG/ML IV BOLUS
INTRAVENOUS | Status: AC
  Filled 2014-01-11: qty 20

## 2014-01-11 MED ORDER — ROCURONIUM BROMIDE 100 MG/10ML IV SOLN
INTRAVENOUS | Status: DC | PRN
  Administered 2014-01-11: 10 mg via INTRAVENOUS
  Administered 2014-01-11: 20 mg via INTRAVENOUS
  Administered 2014-01-11: 40 mg via INTRAVENOUS
  Administered 2014-01-11 (×2): 10 mg via INTRAVENOUS

## 2014-01-11 MED ORDER — ROCURONIUM BROMIDE 50 MG/5ML IV SOLN
INTRAVENOUS | Status: AC
  Filled 2014-01-11: qty 2

## 2014-01-11 MED ORDER — MORPHINE SULFATE 10 MG/ML IJ SOLN
INTRAMUSCULAR | Status: AC
  Filled 2014-01-11: qty 1

## 2014-01-11 MED ORDER — PROMETHAZINE HCL 25 MG/ML IJ SOLN
6.2500 mg | INTRAMUSCULAR | Status: DC | PRN
  Administered 2014-01-11: 6.25 mg via INTRAVENOUS
  Filled 2014-01-11: qty 1

## 2014-01-11 MED ORDER — HYDROMORPHONE HCL PF 1 MG/ML IJ SOLN
0.2500 mg | INTRAMUSCULAR | Status: DC | PRN
  Administered 2014-01-11 (×2): 0.5 mg via INTRAVENOUS

## 2014-01-11 MED ORDER — METHOCARBAMOL 500 MG PO TABS
500.0000 mg | ORAL_TABLET | Freq: Four times a day (QID) | ORAL | Status: DC
  Administered 2014-01-11 – 2014-01-13 (×8): 500 mg via ORAL
  Filled 2014-01-11 (×11): qty 1

## 2014-01-11 MED ORDER — TAMOXIFEN CITRATE 10 MG PO TABS
20.0000 mg | ORAL_TABLET | Freq: Every day | ORAL | Status: DC
  Administered 2014-01-12 – 2014-01-13 (×2): 20 mg via ORAL
  Filled 2014-01-11 (×2): qty 2

## 2014-01-11 MED ORDER — FENTANYL CITRATE 0.05 MG/ML IJ SOLN
INTRAMUSCULAR | Status: AC
  Filled 2014-01-11: qty 5

## 2014-01-11 MED ORDER — ALBUMIN HUMAN 5 % IV SOLN
INTRAVENOUS | Status: DC | PRN
  Administered 2014-01-11: 11:00:00 via INTRAVENOUS

## 2014-01-11 SURGICAL SUPPLY — 82 items
APPLIER CLIP 9.375 MED OPEN (MISCELLANEOUS) ×8
ATCH SMKEVC FLXB CAUT HNDSWH (FILTER) ×2 IMPLANT
BAG DECANTER FOR FLEXI CONT (MISCELLANEOUS) ×4 IMPLANT
BINDER BREAST LRG (GAUZE/BANDAGES/DRESSINGS) ×4 IMPLANT
BINDER BREAST XLRG (GAUZE/BANDAGES/DRESSINGS) IMPLANT
BIOPATCH BLUE 3/4IN DISK W/1.5 (GAUZE/BANDAGES/DRESSINGS) ×8 IMPLANT
BIOPATCH RED 1 DISK 7.0 (GAUZE/BANDAGES/DRESSINGS) ×9 IMPLANT
BIOPATCH RED 1IN DISK 7.0MM (GAUZE/BANDAGES/DRESSINGS) ×3
BLADE SURG 10 STRL SS (BLADE) ×4 IMPLANT
BLADE SURG 15 STRL LF DISP TIS (BLADE) ×2 IMPLANT
BLADE SURG 15 STRL SS (BLADE) ×2
CANISTER SUCTION 2500CC (MISCELLANEOUS) ×4 IMPLANT
CHLORAPREP W/TINT 26ML (MISCELLANEOUS) ×16 IMPLANT
CLIP APPLIE 9.375 MED OPEN (MISCELLANEOUS) ×4 IMPLANT
CLOSURE WOUND 1/2 X4 (GAUZE/BANDAGES/DRESSINGS)
CLOTH BEACON ORANGE TIMEOUT ST (SAFETY) IMPLANT
COVER SURGICAL LIGHT HANDLE (MISCELLANEOUS) ×4 IMPLANT
DERMABOND ADVANCED (GAUZE/BANDAGES/DRESSINGS) ×8
DERMABOND ADVANCED .7 DNX12 (GAUZE/BANDAGES/DRESSINGS) ×8 IMPLANT
DRAIN CHANNEL 19F RND (DRAIN) ×12 IMPLANT
DRAPE INCISE 23X17 IOBAN STRL (DRAPES) ×2
DRAPE INCISE IOBAN 23X17 STRL (DRAPES) ×2 IMPLANT
DRAPE ORTHO SPLIT 77X108 STRL (DRAPES) ×8
DRAPE PROXIMA HALF (DRAPES) ×16 IMPLANT
DRAPE SURG 17X23 STRL (DRAPES) ×32 IMPLANT
DRAPE SURG ORHT 6 SPLT 77X108 (DRAPES) ×8 IMPLANT
DRAPE WARM FLUID 44X44 (DRAPE) ×4 IMPLANT
DRESSING TELFA 8X3 (GAUZE/BANDAGES/DRESSINGS) ×8 IMPLANT
DRSG PAD ABDOMINAL 8X10 ST (GAUZE/BANDAGES/DRESSINGS) ×12 IMPLANT
DRSG TEGADERM 4X4.75 (GAUZE/BANDAGES/DRESSINGS) ×12 IMPLANT
ELECT BLADE 6.5 EXT (BLADE) ×4 IMPLANT
ELECT CAUTERY BLADE 6.4 (BLADE) ×4 IMPLANT
ELECT REM PT RETURN 9FT ADLT (ELECTROSURGICAL) ×4
ELECTRODE REM PT RTRN 9FT ADLT (ELECTROSURGICAL) ×2 IMPLANT
EVACUATOR SILICONE 100CC (DRAIN) ×12 IMPLANT
EVACUATOR SMOKE ACCUVAC VALLEY (FILTER) ×2
GAUZE XEROFORM 5X9 LF (GAUZE/BANDAGES/DRESSINGS) IMPLANT
GLOVE BIO SURGEON STRL SZ7.5 (GLOVE) ×32 IMPLANT
GLOVE BIOGEL PI IND STRL 7.0 (GLOVE) ×4 IMPLANT
GLOVE BIOGEL PI IND STRL 7.5 (GLOVE) ×20 IMPLANT
GLOVE BIOGEL PI IND STRL 8 (GLOVE) ×6 IMPLANT
GLOVE BIOGEL PI INDICATOR 7.0 (GLOVE) ×4
GLOVE BIOGEL PI INDICATOR 7.5 (GLOVE) ×20
GLOVE BIOGEL PI INDICATOR 8 (GLOVE) ×6
GLOVE ECLIPSE 7.5 STRL STRAW (GLOVE) ×20 IMPLANT
GLOVE SURG SS PI 7.0 STRL IVOR (GLOVE) ×16 IMPLANT
GOWN STRL NON-REIN LRG LVL3 (GOWN DISPOSABLE) ×32 IMPLANT
GOWN STRL REIN XL XLG (GOWN DISPOSABLE) ×8 IMPLANT
IMPL SALINE SMOOTH RD 325CC (Breast) ×2 IMPLANT
IMPLANT SALINE SMOOTH RD 325CC (Breast) ×4 IMPLANT
KIT BASIN OR (CUSTOM PROCEDURE TRAY) ×4 IMPLANT
KIT ROOM TURNOVER OR (KITS) ×4 IMPLANT
MARKER SKIN DUAL TIP RULER LAB (MISCELLANEOUS) ×4 IMPLANT
NS IRRIG 1000ML POUR BTL (IV SOLUTION) ×8 IMPLANT
PACK GENERAL/GYN (CUSTOM PROCEDURE TRAY) ×4 IMPLANT
PAD ABD 8X10 STRL (GAUZE/BANDAGES/DRESSINGS) ×24 IMPLANT
PAD ARMBOARD 7.5X6 YLW CONV (MISCELLANEOUS) ×8 IMPLANT
PENCIL BUTTON HOLSTER BLD 10FT (ELECTRODE) ×4 IMPLANT
PREFILTER EVAC NS 1 1/3-3/8IN (MISCELLANEOUS) ×4 IMPLANT
SPONGE GAUZE 4X4 12PLY (GAUZE/BANDAGES/DRESSINGS) ×12 IMPLANT
SPONGE LAP 18X18 X RAY DECT (DISPOSABLE) ×12 IMPLANT
STAPLER VISISTAT 35W (STAPLE) ×8 IMPLANT
STRIP CLOSURE SKIN 1/2X4 (GAUZE/BANDAGES/DRESSINGS) IMPLANT
SUT MNCRL AB 3-0 PS2 18 (SUTURE) ×48 IMPLANT
SUT MNCRL AB 4-0 PS2 18 (SUTURE) ×4 IMPLANT
SUT MON AB 2-0 CT1 36 (SUTURE) ×12 IMPLANT
SUT PDS AB 0 CT 36 (SUTURE) ×16 IMPLANT
SUT PROLENE 3 0 PS 1 (SUTURE) ×28 IMPLANT
SUT PROLENE 3 0 SH 48 (SUTURE) ×4 IMPLANT
SUT PROLENE 5 0 PS 2 (SUTURE) IMPLANT
SUT SILK 4 0 PS 2 (SUTURE) IMPLANT
SUT VIC AB 3-0 FS2 27 (SUTURE) IMPLANT
SUT VIC AB 3-0 SH 8-18 (SUTURE) ×16 IMPLANT
SYR 50ML SLIP (SYRINGE) ×4 IMPLANT
SYR BULB IRRIGATION 50ML (SYRINGE) ×8 IMPLANT
SYR CONTROL 10ML LL (SYRINGE) ×4 IMPLANT
TAPE CLOTH SURG 4X10 WHT LF (GAUZE/BANDAGES/DRESSINGS) ×4 IMPLANT
TOWEL OR 17X24 6PK STRL BLUE (TOWEL DISPOSABLE) ×4 IMPLANT
TOWEL OR 17X26 10 PK STRL BLUE (TOWEL DISPOSABLE) ×8 IMPLANT
TRAY FOLEY CATH 14FRSI W/METER (CATHETERS) ×4 IMPLANT
TUBE CONNECTING 12'X1/4 (SUCTIONS) ×2
TUBE CONNECTING 12X1/4 (SUCTIONS) ×6 IMPLANT

## 2014-01-11 NOTE — Brief Op Note (Signed)
01/11/2014  2:08 PM  PATIENT:  Sydney Cervantes  53 y.o. female  PRE-OPERATIVE DIAGNOSIS:  LEFT BREAST CANCER  POST-OPERATIVE DIAGNOSIS:  LEFT BREAST CANCER  PROCEDURE:  Procedure(s): LEFT LATISSIMUS FLAP TO BREAST WITH SALINE IMPLANT FOR RECONSTRUCTION (Left) RIGHT BREAST REDUCTION  (Right)  SURGEON:  Surgeon(s) and Role:    * Crissie Reese, MD - Primary  PHYSICIAN ASSISTANT:   ASSISTANTS: Ivin Booty, RNFA   ANESTHESIA:   general  EBL:  Total I/O In: 2250 [I.V.:2000; IV Piggyback:250] Out: 385 [Urine:325; Blood:60]  BLOOD ADMINISTERED:none  DRAINS: (3) Jackson-Pratt drain(s) with closed bulb suction in the left back (2) and left chest (1)   LOCAL MEDICATIONS USED:  NONE  SPECIMEN:  Source of Specimen:  right breast tissue  DISPOSITION OF SPECIMEN:  PATHOLOGY  COUNTS:  YES  TOURNIQUET:  * No tourniquets in log *  DICTATION: .Other Dictation: Dictation Number [id number]  PLAN OF CARE: Admit to inpatient   PATIENT DISPOSITION:  PACU - hemodynamically stable.   Delay start of Pharmacological VTE agent (>24hrs) due to surgical blood loss or risk of bleeding: no (she had heparin subcu pre-op

## 2014-01-11 NOTE — Anesthesia Postprocedure Evaluation (Signed)
Anesthesia Post Note  Patient: Sydney Cervantes  Procedure(s) Performed: Procedure(s) (LRB): LEFT LATISSIMUS FLAP TO BREAST WITH SALINE IMPLANT FOR RECONSTRUCTION (Left) RIGHT BREAST REDUCTION  (Right)  Anesthesia type: general  Patient location: PACU  Post pain: Pain level controlled  Post assessment: Patient's Cardiovascular Status Stable  Last Vitals:  Filed Vitals:   01/11/14 1629  BP: 104/74  Pulse: 81  Temp: 36.7 C  Resp: 19    Post vital signs: Reviewed and stable  Level of consciousness: sedated  Complications: No apparent anesthesia complications

## 2014-01-11 NOTE — Transfer of Care (Signed)
Immediate Anesthesia Transfer of Care Note  Patient: Sydney Cervantes  Procedure(s) Performed: Procedure(s): LEFT LATISSIMUS FLAP TO BREAST WITH SALINE IMPLANT FOR RECONSTRUCTION (Left) RIGHT BREAST REDUCTION  (Right)  Patient Location: PACU  Anesthesia Type:General  Level of Consciousness: awake, alert  and oriented  Airway & Oxygen Therapy: Patient Spontanous Breathing and Patient connected to nasal cannula oxygen  Post-op Assessment: Report given to PACU RN and Post -op Vital signs reviewed and stable  Post vital signs: Reviewed and stable  Complications: No apparent anesthesia complications

## 2014-01-11 NOTE — Anesthesia Preprocedure Evaluation (Signed)
Anesthesia Evaluation  Patient identified by MRN, date of birth, ID band Patient awake    Reviewed: Allergy & Precautions, H&P , NPO status , Patient's Chart, lab work & pertinent test results  History of Anesthesia Complications Negative for: history of anesthetic complications  Airway Mallampati: II TM Distance: >3 FB Neck ROM: Full    Dental  (+) Teeth Intact   Pulmonary neg pulmonary ROS,    Pulmonary exam normal       Cardiovascular Exercise Tolerance: Good - angina- CAD and - CHF - Valvular Problems/MurmursRhythm:Regular Rate:Normal     Neuro/Psych Lower extremity neuropathy s/p chemotherapy    GI/Hepatic negative GI ROS, Intermittent liver enzyme elevation, like steatosis   Endo/Other  negative endocrine ROS  Renal/GU negative Renal ROS  negative genitourinary   Musculoskeletal negative musculoskeletal ROS (+)   Abdominal   Peds  Hematology negative hematology ROS (+)   Anesthesia Other Findings   Reproductive/Obstetrics                           Anesthesia Physical Anesthesia Plan  ASA: II  Anesthesia Plan: General   Post-op Pain Management:    Induction: Intravenous  Airway Management Planned: LMA and Oral ETT  Additional Equipment: None  Intra-op Plan:   Post-operative Plan: Extubation in OR  Informed Consent: I have reviewed the patients History and Physical, chart, labs and discussed the procedure including the risks, benefits and alternatives for the proposed anesthesia with the patient or authorized representative who has indicated his/her understanding and acceptance.   Dental advisory given  Plan Discussed with: CRNA and Surgeon  Anesthesia Plan Comments:         Anesthesia Quick Evaluation

## 2014-01-11 NOTE — Anesthesia Procedure Notes (Signed)
Procedure Name: Intubation Date/Time: 01/11/2014 7:56 AM Performed by: Eligha Bridegroom Pre-anesthesia Checklist: Patient identified, Timeout performed, Emergency Drugs available, Patient being monitored and Suction available Patient Re-evaluated:Patient Re-evaluated prior to inductionOxygen Delivery Method: Circle system utilized Preoxygenation: Pre-oxygenation with 100% oxygen Intubation Type: IV induction Ventilation: Mask ventilation without difficulty and Oral airway inserted - appropriate to patient size Laryngoscope Size: Mac and 3 Grade View: Grade II Tube type: Oral Number of attempts: 1 Airway Equipment and Method: Stylet Placement Confirmation: ETT inserted through vocal cords under direct vision and breath sounds checked- equal and bilateral Secured at: 21 cm Tube secured with: Tape Dental Injury: Teeth and Oropharynx as per pre-operative assessment

## 2014-01-11 NOTE — Preoperative (Signed)
Beta Blockers   Reason not to administer Beta Blockers:Not Applicable 

## 2014-01-11 NOTE — H&P (Signed)
I have re-examined and re-evaluated the patient and there are no changes. See office notes in paper chart.  Planned Procedure: Left breast reconstruction with Latissimus flap and implant and right breast reduction.

## 2014-01-12 MED ORDER — HEPARIN SODIUM (PORCINE) 5000 UNIT/ML IJ SOLN
5000.0000 [IU] | Freq: Three times a day (TID) | INTRAMUSCULAR | Status: DC
  Administered 2014-01-12 – 2014-01-13 (×4): 5000 [IU] via SUBCUTANEOUS
  Filled 2014-01-12 (×7): qty 1

## 2014-01-12 NOTE — Anesthesia Postprocedure Evaluation (Signed)
  Anesthesia Post-op Note  Patient: Sydney Cervantes  Procedure(s) Performed: Procedure(s): LEFT LATISSIMUS FLAP TO BREAST WITH SALINE IMPLANT FOR RECONSTRUCTION (Left) RIGHT BREAST REDUCTION  (Right)  Patient Location: PACU  Anesthesia Type:General  Level of Consciousness: awake, alert  and oriented  Airway and Oxygen Therapy: Patient Spontanous Breathing and Patient connected to nasal cannula oxygen  Post-op Pain: mild  Post-op Assessment: Post-op Vital signs reviewed, Patient's Cardiovascular Status Stable, Respiratory Function Stable, Patent Airway, No signs of Nausea or vomiting and Pain level controlled  Post-op Vital Signs: Reviewed and stable  Complications: No apparent anesthesia complications

## 2014-01-12 NOTE — Progress Notes (Signed)
UR completed.  Shelvie Salsberry, RN BSN M[facility] CCM Trauma/Neuro ICU Case Manager [phone]  

## 2014-01-12 NOTE — Op Note (Signed)
Sydney Cervantes, Sydney Cervantes NO.:  [id number]  MEDICAL RECORD NO.:  [id number]  LOCATION:  [id number]                        FACILITY:  [facility]  PHYSICIAN:  Crissie Reese, M.D.     DATE OF BIRTH:  10/16/1961  DATE OF PROCEDURE:  01/11/2014 DATE OF DISCHARGE:                              OPERATIVE REPORT   PREOPERATIVE DIAGNOSES: 1. Breast cancer, left side. 2. Right macromastia.  POSTOPERATIVE DIAGNOSES: 1. Breast cancer, left side. 2. Right macromastia.  PROCEDURES: 1. Left latissimus dorsi myocutaneous flap to the left chest. 2. Delayed breast reconstruction, left breast with saline implant. 3. Right breast reduction for symmetry.  SURGEON:  Crissie Reese, M.D.  ANESTHESIA:  General.  ESTIMATED BLOOD LOSS:  80 mL.  DRAINS:  Two 19-French in the left back, one 19-French in the left chest.  CLINICAL NOTE:  A 53 year old woman has had breast cancer and has had chemotherapy as well as radiation treatment.  She is now over a year out from her radiation treatments.  She presents for her left breast reconstruction.  Options were discussed and she elected to have the latissimus flap with implant.  She preferred the saline implant as opposed to silicone gel.  In addition, she wanted the right breast reduced at the same time.  It was explained to her that it might be a little more accurate to do that at a later time, but she wanted to go ahead with the right breast reduction at that same surgery.  The nature of these procedures and risks and possible complications were discussed with her in detail.  These risks include, but not limited to, bleeding, infection, healing problems, especially in the radiated field, loss of sensation, loss of feeling, anesthesia related complications, loss of tissue, loss of the nipple on the right, loss of the latissimus flap on the left, loss of sensation in the nipple on the right side, failure of the device implanted on the left,  displacement of device, capsular contracture, wrinkles and ripples related to the device on the left side, contour deformities at the periphery of the reconstruction, pneumothorax, DVT, pulmonary embolism, loss of range of motion and/or strength in the left arm and shoulder, aggravation of her lymphedema that she has in the left arm, asymmetry, chronic pain, and disappointment.  She understood all of this and wished to proceed.  DESCRIPTION OF PROCEDURE:  The patient was marked in the holding area for the left latissimus muscle flap, the left breast reconstruction, and the right breast reduction.  She was then taken to the operating room and placed supine.  She was assessed under general anesthesia.  She was prepped with ChloraPrep.  After waiting full 3 minutes for drying, she was draped with sterile drapes.  The old mastectomy scar was opened and the dissection carried down through subcutaneous tissues to the underlying pectoralis major muscle.  The mastectomy defect was then recreated with superior and inferior mastectomy flaps.  Hemostasis with electrocautery.  Irrigation with saline.  Antibiotic-soaked lap was placed and the skin was stapled closed over this and the large plastic sterile drape was then placed as an adherent dressing over all of this for repositioning.  The  patient was then placed in a right lateral decubitus position with a right axillary roll and stabilized by the bean bag.  She was again prepped with ChloraPrep and after waiting the full 3 minutes for drying, she was draped with sterile drapes including Steri-drapes impervious. The skin paddle was incised.  The dissection carried into subcutaneous tissue beveling cephalad and inferior in order to ensure a broader attachment of adipose tissue on the muscle in order to ensure blood flow to the skin paddle.  This also gave added volume for her breast reconstruction as well.  The dissection carried out to the  lateral, medial, superior, and inferior borders of the muscle.  The muscle was then divided distally and reflected in a cephalad direction.  Large perforating vessels were either triple-ligated with Ligaclips and divided, some of the smaller ones double ligated with Ligaclips and divided, and some vessels secured with a 3-0 Vicryl suture ligature and divided.  Great care was taken throughout the dissection to avoid damage to deeper structures as well as the chest cavity.  A through and through subcutaneous tunnel was then created to the left chest and the lap that had been placed at the time of the recreation of the mastectomy defect was then removed.  Antibiotic solution placed and the flap was passed gently through this tunnel without any tension.  Thorough irrigation with saline.  Irrigation with antibiotic solution.  Meticulous hemostasis was achieved using electrocautery.  Two 19-French drains were positioned, brought out through separate stab wounds inferoanteriorly and secured with 3-0 Prolene sutures.  Excellent stasis had been confirmed.  The closure with 2-0 PDS interrupted inverted deep sutures, 2-0 Monocryl interrupted inverted deep dermal sutures, 3-0 Monocryl running subcuticular suture, and a few reinforcing 3-0 Prolene simple interrupted sutures at the central aspect of the wound.  The remainder of the wound on each side of these Prolene sutures was sealed with Dermabond.  Drains dressed with BioPatch and Tegaderm.  ABD placed over the wound and secured with some Hypafix tape.  The patient was then rolled to the supine position again.  Once she was then positioned, the large plastic drape was removed from the wound, and the left side of the chest prepped with Betadine, and the right side prepped with ChloraPrep.  After waiting full 3 minutes for drying, she was draped with sterile drapes.  Again these included Steri-drapes that were impervious.  The staples had been  placed in the left mastectomy wound were then removed and the flap was inspected and found to have excellent color.  Irrigation with saline as well as antibiotic solution. Hemostasis with electrocautery.  The flap appeared to be viable with good capillary refill 1-2 seconds for the skin paddle.  The insetting was begun laterally, 3-0 Vicryl interrupted sutures, and then inferiorly and medially.  It was felt that the 375 mL maximum fill implant would be appropriate.  This was brought into the field and covered with antibiotic solution.  After thoroughly cleaning gloves, the implant was prepared.  This was mentored 325+ 50 maximum fill of 375 mL implant.  A 100 mL sterile saline placed using a closed filling and the implant was then returned to the antibiotic solution.  Irrigation underneath the latissimus muscle flap and excellent hemostasis was confirmed.  The implant was positioned, filled with maximum 375 mL of saline using the closed filling system.  The latissimus muscle was not quite large enough to cover the implant and so, the pectoralis muscle was lifted for  short distance.  Using electrocautery was again great care taken to avoid damage to underlying chest cavity.  With the latissimus and the pectoralis, the implant was well-covered with muscle.  Care was taken to avoid any release of the pectoralis muscle along the sternal border. The remainder of the closure completed 3-0 Vicryl interrupted sutures with great care taken to avoid damage to underlying implant.  It was kept under direct vision all times.  Antibiotic solution was placed on the implant prior to completing this closure.  A 19-French drain positioned, brought out through separate stab wound inferiorly and secured with a 3-0 Prolene suture.  The skin closure with a combination of 3-0 Monocryl interrupted inverted deep dermal sutures and 3-0 Prolene simple interrupted sutures.  The flap had excellent color bright  red bleeding in its periphery both medial and lateral consistent viability.  Attention was then directed to the right breast reduction.  A 38 mm marker marked on the new nipple-areolar complex.  An 8 cm wide inferior nipple-areolar pedicle was designed.  The incisions were made in the inferior nipple-areolar pedicle was deepithelialized.  The pedicle was then freed from surrounding tissues beveling in outward direction medial and lateral in order to ensure a broader attachment at the level of the chest wall, then was present at the level of the skin.  At the conclusion of this dissection, the nipple complex was inspected and found to have excellent color and bright red bleeding at the periphery consistent viability.  Resection of breast tissue was performed medial, central, and lateral.  Total of 95 g resected.  This seemed to give her a fairly good symmetry with the opposite side.  The nipple complex had excellent color.  The wound was then irrigated thoroughly with saline and meticulous hemostasis with electrocautery.  Some antibiotic solution was also placed.  The closure with 2-0 Monocryl interrupted inverted deep suture at the inverted T-position followed by 3-0 and 2-0 Monocryl interrupted inverted deep dermal sutures and 3-0 Monocryl running subcuticular suture.  A measurement was taken 5 cm up from the inframammary crease and the 38 mm marker marked in the nipple-areolar complex site.  This tissue resected.  Thorough irrigation with saline and meticulous hemostasis with electrocautery.  The nipple-areolar complex brought out through this opening.  It was inspected and found to have again excellent color and bright red bleeding in its periphery consistent viability.  Nipple-areolar insetting with 3-0 Monocryl interrupted inverted deep dermal sutures and running 4-0 Monocryl subcuticular suture.  Dermabond was applied to the wounds.  Dry sterile dressings were placed.  BioPatch,  and Tegaderm used for the left chest drain.  She was then placed in the breast binder and was transferred to the recovery room in stable having tolerated the procedure well.     Crissie Reese, M.D.     DB/MEDQ  D:  01/11/2014  T:  01/12/2014  Job:  [id number]

## 2014-01-12 NOTE — Evaluation (Signed)
Occupational Therapy Evaluation Patient Details Name: Sydney Cervantes MRN: [id number] DOB: 09-29-1961 Today's Date: 01/12/2014 Time: 1100-1147 OT Time Calculation (min): 47 min  OT Assessment / Plan / Recommendation History of present illness Lymphedema LUE s/p breast cancer. Pt now s/p reconstruction   Clinical Impression   Pt presents to OT with lymphedema LUE. Pt will benefit from skilled OT to decrease edema LUE      OT Assessment  Patient needs continued OT Services    Follow Up Recommendations  Outpatient OT;Other (comment) (pt is a patient at [facility] for [facility]ymphedema treatment)       Equipment Recommendations  None recommended by OT       Frequency  Min 2X/week    Precautions / Restrictions Precautions Precaution Comments: lymphedema LUE       ADL  Transfers/Ambulation Related to ADLs: OT performed lymphedema wrapping LUE per MD. Ptdoes report increased edema in LUE which was uncomfortable.  Pt felt relief s.p wrapping and was thankful for OT wrapping pt.  Pts daughter is aware of how to wrap pt as she was trained at OP but currently has the flu.  Saturday OT will check on this pt. ADL Comments: Order in chart for lymphedema LUE ( in nursing section)  OT ask for RN to obtain order from Dr [name] for OT/Pt order for lymphedema wrapping.    OT Diagnosis: Generalized weakness;Other (comment) (lymphedema)  OT Problem List: Decreased strength;Decreased range of motion OT Treatment Interventions: Therapeutic activities   OT Goals(Current goals can be found in the care plan section) Acute Rehab OT Goals Patient Stated Goal: decrease edema OT Goal Formulation: With patient Time For Goal Achievement: 01/26/14  Visit Information  Last OT Received On: 01/12/14 History of Present Illness: LUE          Vision/Perception Vision - History Patient Visual Report: No change from baseline   Cognition  Cognition Arousal/Alertness: Awake/alert Behavior During Therapy: WFL for  tasks assessed/performed Overall Cognitive Status: Within Functional Limits for tasks assessed    Extremity/Trunk Assessment Upper Extremity Assessment Upper Extremity Assessment: LUE deficits/detail LUE Deficits / Details: lymphedema              End of Session OT - End of Session Activity Tolerance: Patient tolerated treatment well Patient left: in bed;with family/visitor present  Harvest, Thereasa Parkin 01/12/2014, 1:57 PM

## 2014-01-12 NOTE — Progress Notes (Signed)
Subjective: Sore but good pain control.No nausea this am.  Objective: Vital signs in last 24 hours: Temp:  [97.5 F (36.4 C)-98.9 F (37.2 C)] 98.9 F (37.2 C) (02/06 0517) Pulse Rate:  [71-111] 106 (02/06 0517) Resp:  [6-19] 11 (02/06 0759) BP: (104-136)/(62-90) 109/63 mmHg (02/06 0517) SpO2:  [99 %-100 %] 100 % (02/06 0759) FiO2 (%):  [39 %] 39 % (02/06 0400) Weight:  [176 lb 9.4 oz (80.1 kg)] 176 lb 9.4 oz (80.1 kg) (02/05 1629)  Intake/Output from previous day: 02/05 0701 - 02/06 0700 In: 3850 [I.V.:3600; IV Piggyback:250] Out: 3110 [Urine:2775; Drains:275; Blood:60] Intake/Output this shift:    Operative sites: Mastectomy flaps viable. Flap viable. Good color throughout skin paddle. Nipple complex on right has excellent color. Implant on left appears in good position. Drains functioning. Drainage thin. No evidence of bleeding or infection at any site. Excellent urine output.  No results found for this basename: WBC, HGB, HCT, PLATELETS, NA, K, CL, CO2, BUN, CREATININE, GLU,  in the last 72 hours  Studies/Results: No results found.  Assessment/Plan: She would like to go ahead with heparin prophylaxis for DVT. Saline lock IV. D/C PCA and begin PO pain med. Ambulate in hall. D/C foley.   LOS: 1 day    Sydney Cervantes M 01/12/2014 9:45 AM

## 2014-01-12 NOTE — Progress Notes (Signed)
Foley removed. Pt. Tolerated well. Will continue to monitor. Syliva Overman

## 2014-01-12 NOTE — Progress Notes (Signed)
Lymphedema order noted in nursing section of chart. OT did perform wrapping after speaking to RN.  Pt tolerated well. Please order OT/PT lymphedema so we can follow up with pt tomorrow.  Thanks,. [place], [place] [phone]

## 2014-01-12 NOTE — Progress Notes (Signed)
Pt. Ambulated in room to bathroom.  Tolerated well.  Wants to walk some more later tonight.  Will continue to monitor. Syliva Overman

## 2014-01-13 MED ORDER — DSS 100 MG PO CAPS: 100.0000 mg | ORAL_CAPSULE | Freq: Every day | ORAL | Status: DC

## 2014-01-13 MED ORDER — CEPHALEXIN 500 MG PO CAPS: 500.0000 mg | ORAL_CAPSULE | Freq: Four times a day (QID) | ORAL | Status: DC

## 2014-01-13 MED ORDER — HYDROMORPHONE HCL 2 MG PO TABS: 2.0000 mg | ORAL_TABLET | ORAL | Status: DC | PRN

## 2014-01-13 MED ORDER — ENOXAPARIN SODIUM 40 MG/0.4ML ~~LOC~~ SOLN: 40.0000 mg | SUBCUTANEOUS | Status: DC

## 2014-01-13 MED ORDER — SULFAMETHOXAZOLE-TMP DS 800-160 MG PO TABS: 1.0000 | ORAL_TABLET | Freq: Two times a day (BID) | ORAL | Status: DC

## 2014-01-13 MED ORDER — METHOCARBAMOL 500 MG PO TABS: 500.0000 mg | ORAL_TABLET | Freq: Four times a day (QID) | ORAL | Status: DC

## 2014-01-13 NOTE — Progress Notes (Signed)
Discharge home. Home discharge instruction given, educated about lovenox administration, video given.

## 2014-01-13 NOTE — Discharge Instructions (Addendum)
No lifting for 6 weeks No vigorous activity for 6 weeks (including outdoor walks) No driving for 4 weeks OK to walk up stairs slowly Stay propped up Use incentive spirometer at home every hour while awake No shower while drains are in place Empty drains at least three times a day and record the amounts separately Change drain dressings every third day if instructed to do so by Dr. [name]  Apply Bacitracin antibiotic ointment to the drain sites  Place gauze dressing over drains  Secure the gauze with tape Take an over-the-counter Probiotic while on antibiotics Take an over-the-counter stool softener (such as Colace) while on pain medication See Dr. [name] this next week in office Lovenox shots will begin at home either tonight or tomorrow and then continue at about same time every day For questions call [phone] or [phone]

## 2014-01-13 NOTE — Discharge Summary (Signed)
Physician Discharge Summary  Patient ID: Sydney Cervantes MRN: [id number] DOB/AGE: September 27, 1961 53 y.o.  Admit date: 01/11/2014 Discharge date: 01/13/2014  Admission Diagnoses:Breast cancer left and macromastia right  Discharge Diagnoses: Same Active Problems:   Breast cancer   Discharged Condition: good  Hospital Course: On the day of admission the patient was taken to surgery and had left breast reconstruction with latissimus flap and saline implant and right breast reduction. The patient tolerated the procedures well. Postoperatively, the flap maintained excellent color and capillary refill. Nipple complex on right sensate and viable The patient was ambulatory and tolerating diet on the first postoperative day. DVT prophylaxis was given both pre-op and post-op. Good pain control. Ready for discharge.  Treatments: antibiotics: Ancef, anticoagulation: heparin and surgery: left breast reconstruction latissimus flap and implant and right breast reduction  Discharge Exam: Blood pressure 110/74, pulse 110, temperature 98.3 F (36.8 C), temperature source Oral, resp. rate 18, height 5\' 8"  (1.727 m), weight 176 lb 9.4 oz (80.1 kg), SpO2 97.00%.  Operative sites: Mastectomy flaps viable. Flap viable. Good color in skin paddle. Implant appears to be in good position. Right nipple complex appears healthy. No evidence of bleeding or infection at any operative site. Drains functioning. Drainage thin.  Disposition: 01-Home or Self Care   Future Appointments Provider Department Dept Phone   06/18/2014 10:00 AM [facility] Only [facility] [phone]   06/25/2014 10:30 AM Chauncey Cruel, MD [facility] [phone]       Medication List    STOP taking these medications       ibuprofen 200 MG tablet  Commonly known as:  ADVIL,MOTRIN      TAKE these medications       acetaminophen 325 MG tablet  Commonly known as:  TYLENOL  Take  650 mg by mouth every 6 (six) hours as needed. For pain     CALTRATE 600+D PO  Take 1 tablet by mouth daily.     DSS 100 MG Caps  Take 100 mg by mouth daily.     enoxaparin 40 MG/0.4ML injection  Commonly known as:  LOVENOX  Inject 0.4 mLs (40 mg total) into the skin daily.  Start taking on:  01/14/2014     HYDROmorphone 2 MG tablet  Commonly known as:  DILAUDID  Take 1-2 tablets (2-4 mg total) by mouth every 4 (four) hours as needed for severe pain.     methocarbamol 500 MG tablet  Commonly known as:  ROBAXIN  Take 1 tablet (500 mg total) by mouth 4 (four) times daily.     sulfamethoxazole-trimethoprim 800-160 MG per tablet  Commonly known as:  BACTRIM DS  Take 1 tablet by mouth 2 (two) times daily.     tamoxifen 20 MG tablet  Commonly known as:  NOLVADEX  Take 1 tablet (20 mg total) by mouth daily.         SignedHarlow Mares, Malakye Nolden M 01/13/2014, 11:54 AM

## 2014-01-15 ENCOUNTER — Encounter (HOSPITAL_COMMUNITY): Payer: Self-pay | Admitting: Plastic Surgery

## 2014-02-12 ENCOUNTER — Ambulatory Visit: Payer: Commercial Managed Care - PPO | Attending: Plastic Surgery | Admitting: Physical Therapy

## 2014-02-12 DIAGNOSIS — C773 Secondary and unspecified malignant neoplasm of axilla and upper limb lymph nodes: Secondary | ICD-10-CM | POA: Insufficient documentation

## 2014-02-12 DIAGNOSIS — C50919 Malignant neoplasm of unspecified site of unspecified female breast: Secondary | ICD-10-CM | POA: Insufficient documentation

## 2014-02-12 DIAGNOSIS — IMO0001 Reserved for inherently not codable concepts without codable children: Secondary | ICD-10-CM | POA: Insufficient documentation

## 2014-02-12 DIAGNOSIS — I890 Lymphedema, not elsewhere classified: Secondary | ICD-10-CM | POA: Insufficient documentation

## 2014-02-14 ENCOUNTER — Ambulatory Visit: Payer: Commercial Managed Care - PPO

## 2014-02-19 ENCOUNTER — Ambulatory Visit: Payer: Commercial Managed Care - PPO | Admitting: Physical Therapy

## 2014-02-21 ENCOUNTER — Ambulatory Visit: Payer: Commercial Managed Care - PPO

## 2014-02-21 ENCOUNTER — Encounter: Payer: Commercial Managed Care - PPO | Admitting: Physical Therapy

## 2014-02-26 ENCOUNTER — Ambulatory Visit: Payer: Commercial Managed Care - PPO | Admitting: Physical Therapy

## 2014-02-28 ENCOUNTER — Ambulatory Visit: Payer: Commercial Managed Care - PPO | Admitting: Physical Therapy

## 2014-03-05 ENCOUNTER — Ambulatory Visit: Payer: Commercial Managed Care - PPO | Admitting: Physical Therapy

## 2014-03-07 ENCOUNTER — Ambulatory Visit: Payer: Commercial Managed Care - PPO | Attending: Plastic Surgery | Admitting: Physical Therapy

## 2014-03-07 DIAGNOSIS — I890 Lymphedema, not elsewhere classified: Secondary | ICD-10-CM | POA: Insufficient documentation

## 2014-03-07 DIAGNOSIS — C50919 Malignant neoplasm of unspecified site of unspecified female breast: Secondary | ICD-10-CM | POA: Insufficient documentation

## 2014-03-07 DIAGNOSIS — IMO0001 Reserved for inherently not codable concepts without codable children: Secondary | ICD-10-CM | POA: Insufficient documentation

## 2014-03-07 DIAGNOSIS — C773 Secondary and unspecified malignant neoplasm of axilla and upper limb lymph nodes: Secondary | ICD-10-CM | POA: Insufficient documentation

## 2014-03-12 ENCOUNTER — Ambulatory Visit: Payer: Commercial Managed Care - PPO | Admitting: Physical Therapy

## 2014-03-14 ENCOUNTER — Ambulatory Visit: Payer: Commercial Managed Care - PPO | Admitting: Physical Therapy

## 2014-03-19 ENCOUNTER — Ambulatory Visit: Payer: Commercial Managed Care - PPO | Admitting: Physical Therapy

## 2014-03-21 ENCOUNTER — Ambulatory Visit: Payer: Commercial Managed Care - PPO

## 2014-03-26 ENCOUNTER — Ambulatory Visit: Payer: Commercial Managed Care - PPO | Admitting: Physical Therapy

## 2014-03-28 ENCOUNTER — Ambulatory Visit (INDEPENDENT_AMBULATORY_CARE_PROVIDER_SITE_OTHER): Payer: Commercial Managed Care - PPO | Admitting: Surgery

## 2014-03-28 ENCOUNTER — Ambulatory Visit: Payer: Commercial Managed Care - PPO | Admitting: Physical Therapy

## 2014-03-28 ENCOUNTER — Encounter (INDEPENDENT_AMBULATORY_CARE_PROVIDER_SITE_OTHER): Payer: Self-pay | Admitting: Surgery

## 2014-03-28 VITALS — BP 126/78 | HR 75 | Temp 97.6°F | Ht 68.0 in | Wt 162.6 lb

## 2014-03-28 DIAGNOSIS — C50512 Malignant neoplasm of lower-outer quadrant of left female breast: Secondary | ICD-10-CM

## 2014-03-28 DIAGNOSIS — C50519 Malignant neoplasm of lower-outer quadrant of unspecified female breast: Secondary | ICD-10-CM

## 2014-03-28 NOTE — Progress Notes (Signed)
Sydney Cervantes   Re:   Sydney Cervantes DOB:   10/19/1961 MRN:   [id number]  [place]  ASSESSMENT AND PLAN: 1.  Left breast cancer.  T4, N3, Mx, at 11 o'clock.  Original [place] - 4.5 cm mass, MRI - 7.6 cm (01/19/2012) mass extending to pectoralis muscle.  Lobular, grade 3.  ER - 62%, PR - 5%, Her2Neu - neg., Ki67 - 33  Treating oncology - Magrinat (she did see Rubin)/[facility].  Neoadjuvant chemotx  finished  08/02/2012.  Left MRM - 08/25/2012 - D. Newman.    Final Path - no residual cancer in the breast, but 7/8 nodes positive.  She finished radiation tx in December 2013  Left latissimus flap with saline implant by Dr. [name] 01/11/2014.  She is disease free. I will see her in 6 months.   2.  Level 1, 2 and 3 nodes positive - by MRI. 3.  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  4.  Power port - Removed [facility]/22/2013 at [facility]. 5.  BRCA 1/2 neg - April 2013.  CancerNext panel (14 genes) was negative.  07/14/2012 6.  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 7.  She reminded me that it has been > 10 years since her last colonoscopy, she is > 74 yo, she had breast cancer.  So she needs a colonoscopy.  She cannot remember who did her last colonoscopy, so I will set this up to do.   REFERRING PHYSICIAN:  Dag Pavic, [facility].  HISTORY OF PRESENT ILLNESS: Sydney Cervantes is a 53 y.o. (DOB: 08-31-1961)  whtie female whose primary care physician is [name], NP, [facility], and comes to me today for follow up of left breast cancer and left MRM which I did 08/25/2012. Her husband is with her.  Husband is with her.  She is doing well.  She had reconstruction of the left breast by Dr. [name].   She had a reduction mammoplasty on the right. She is tolerating the Tamoxifen well. We talked about a colonoscopy.  I gave her a bowel prep and a sheet on colonoscopies.  She has been through this before, but it has been a while.  Breast family history: Her mother had breast cancer in her 56's.  She then had a  recurrence.  She is still living.      Past Medical History  Diagnosis Date  . Hx of colonoscopy 2000  . Wears glasses   . Seasonal allergies     "when molds and things come out; pollens" (08/25/2012)  . Breast cancer     left  . Anemia     "chemo induced" (08/25/2012)  . Hx of radiation therapy 09/21/12 -11/17/12    R supraclav fossa, L chest wall/SCVL fossa/PAB mid axilla/IM nodes/scar boost  . Hepatic steatosis 12/27/2013  . GERD (gastroesophageal reflux disease)     occ; "side effect of chemo" (08/25/2012)     Current Outpatient Prescriptions  Medication Sig Dispense Refill  . acetaminophen (TYLENOL) 325 MG tablet Take 650 mg by mouth every 6 (six) hours as needed. For pain      . Calcium Carbonate-Vitamin D (CALTRATE 600+D PO) Take 1 tablet by mouth daily.       . tamoxifen (NOLVADEX) 20 MG tablet Take 1 tablet (20 mg total) by mouth daily.  90 tablet  3   No current facility-administered medications for this visit.     Allergies  Allergen Reactions  . Adhesive [Tape] Itching  and Rash    Rash and itching at steristrip and EKG lead pad sites  . Codeine     Headache    REVIEW OF SYSTEMS: Gastrointestinal: Lap chole - 2000's by Dr. Marlou Starks, Appendiceal carcinoid - 1987, last colonoscopy about 2000.  SOCIAL and FAMILY HISTORY: Married.  Husband, [name], with patient. They have 3 daughters - 22,19, 43.  (One daughter made me a pink bracelet) (Two of her daughters went to [place]) She works from home now.  PHYSICAL EXAM: BP 126/78  Pulse 75  Temp(Src) 97.6 F (36.4 C)  Ht 5' 8" (1.727 m)  Wt 162 lb 9.6 oz (73.755 kg)  BMI 24.73 kg/m2  General: WN WF who is alert and generally healthy appearing.  Neck:  No thyroid mass Lymph Nodes:  No cervical, supraclavicular, or axillary noder Breasts:  Left:  Reconstructed with latissimus flap and implant.  Right:  No mass port scar okay. Extremities:  Has some lymphedema of left upper extremity.  DATA  REVIEWED: Notes in [place], MD,  [facility], [address]  [place], [place]    [place] Phone:  [place]:  [phone]

## 2014-04-02 ENCOUNTER — Ambulatory Visit: Payer: Commercial Managed Care - PPO | Admitting: Physical Therapy

## 2014-04-04 ENCOUNTER — Encounter: Payer: Commercial Managed Care - PPO | Admitting: Physical Therapy

## 2014-04-05 ENCOUNTER — Ambulatory Visit: Payer: Commercial Managed Care - PPO | Admitting: Physical Therapy

## 2014-04-09 ENCOUNTER — Ambulatory Visit: Payer: Commercial Managed Care - PPO | Attending: Plastic Surgery | Admitting: Physical Therapy

## 2014-04-09 DIAGNOSIS — C773 Secondary and unspecified malignant neoplasm of axilla and upper limb lymph nodes: Secondary | ICD-10-CM | POA: Insufficient documentation

## 2014-04-09 DIAGNOSIS — C50919 Malignant neoplasm of unspecified site of unspecified female breast: Secondary | ICD-10-CM | POA: Insufficient documentation

## 2014-04-09 DIAGNOSIS — I890 Lymphedema, not elsewhere classified: Secondary | ICD-10-CM | POA: Insufficient documentation

## 2014-04-09 DIAGNOSIS — IMO0001 Reserved for inherently not codable concepts without codable children: Secondary | ICD-10-CM | POA: Insufficient documentation

## 2014-04-11 ENCOUNTER — Ambulatory Visit: Payer: Commercial Managed Care - PPO

## 2014-04-16 ENCOUNTER — Ambulatory Visit: Payer: Commercial Managed Care - PPO | Admitting: Physical Therapy

## 2014-04-19 ENCOUNTER — Encounter: Payer: Self-pay | Admitting: *Deleted

## 2014-04-19 ENCOUNTER — Ambulatory Visit: Payer: Commercial Managed Care - PPO | Admitting: Physical Therapy

## 2014-04-19 ENCOUNTER — Telehealth: Payer: Self-pay | Admitting: *Deleted

## 2014-04-19 NOTE — Telephone Encounter (Signed)
Error

## 2014-04-19 NOTE — [facility] Oncology Navigator Note (Signed)
Received call from [facility] at Dr. [name] office stating patient had a breast reduction and she is scheduled for her yearly mammogram in June and Dr. [name] would like to push that out to the end of July which would 6 months after her reduction.  Informed her that this would be ok and to let me know when they reschedule her appointment and I may need to reschedule her appointment with Dr. Jana Hakim.

## 2014-04-23 ENCOUNTER — Ambulatory Visit: Payer: Commercial Managed Care - PPO | Admitting: Physical Therapy

## 2014-04-25 ENCOUNTER — Ambulatory Visit: Payer: Commercial Managed Care - PPO | Admitting: Physical Therapy

## 2014-04-27 ENCOUNTER — Ambulatory Visit: Payer: Commercial Managed Care - PPO | Admitting: Physical Therapy

## 2014-05-02 ENCOUNTER — Ambulatory Visit: Payer: Commercial Managed Care - PPO | Admitting: Physical Therapy

## 2014-05-03 ENCOUNTER — Ambulatory Visit: Payer: Commercial Managed Care - PPO

## 2014-05-09 ENCOUNTER — Ambulatory Visit: Payer: Commercial Managed Care - PPO | Attending: Plastic Surgery | Admitting: Physical Therapy

## 2014-05-09 DIAGNOSIS — I890 Lymphedema, not elsewhere classified: Secondary | ICD-10-CM | POA: Insufficient documentation

## 2014-05-09 DIAGNOSIS — IMO0001 Reserved for inherently not codable concepts without codable children: Secondary | ICD-10-CM | POA: Insufficient documentation

## 2014-05-09 DIAGNOSIS — C773 Secondary and unspecified malignant neoplasm of axilla and upper limb lymph nodes: Secondary | ICD-10-CM | POA: Insufficient documentation

## 2014-05-09 DIAGNOSIS — C50919 Malignant neoplasm of unspecified site of unspecified female breast: Secondary | ICD-10-CM | POA: Insufficient documentation

## 2014-05-11 ENCOUNTER — Encounter (HOSPITAL_COMMUNITY)
Admission: RE | Disposition: A | Discharge status: Home / Self Care | Payer: Self-pay | Source: Ambulatory Visit | Attending: Surgery

## 2014-05-11 ENCOUNTER — Encounter (HOSPITAL_COMMUNITY): Payer: Self-pay | Admitting: *Deleted

## 2014-05-11 ENCOUNTER — Other Ambulatory Visit (INDEPENDENT_AMBULATORY_CARE_PROVIDER_SITE_OTHER): Payer: Self-pay | Admitting: Surgery

## 2014-05-11 ENCOUNTER — Ambulatory Visit (HOSPITAL_COMMUNITY)
Admission: RE | Admit: 2014-05-11 | Discharge: 2014-05-11 | Disposition: A | Discharge status: Home / Self Care | Payer: Commercial Managed Care - PPO | Source: Ambulatory Visit | Attending: Surgery | Admitting: Surgery

## 2014-05-11 DIAGNOSIS — Z1211 Encounter for screening for malignant neoplasm of colon: Secondary | ICD-10-CM

## 2014-05-11 DIAGNOSIS — Z79899 Other long term (current) drug therapy: Secondary | ICD-10-CM | POA: Insufficient documentation

## 2014-05-11 DIAGNOSIS — K5730 Diverticulosis of large intestine without perforation or abscess without bleeding: Secondary | ICD-10-CM | POA: Insufficient documentation

## 2014-05-11 DIAGNOSIS — Z859 Personal history of malignant neoplasm, unspecified: Secondary | ICD-10-CM | POA: Insufficient documentation

## 2014-05-11 DIAGNOSIS — C50919 Malignant neoplasm of unspecified site of unspecified female breast: Secondary | ICD-10-CM | POA: Insufficient documentation

## 2014-05-11 DIAGNOSIS — C7A00 Malignant carcinoid tumor of unspecified site: Secondary | ICD-10-CM

## 2014-05-11 HISTORY — PX: COLONOSCOPY: SHX5424

## 2014-05-11 HISTORY — DX: Benign carcinoid tumor of the large intestine, unspecified portion: D3A.029

## 2014-05-11 SURGERY — COLONOSCOPY
Anesthesia: Moderate Sedation

## 2014-05-11 MED ORDER — MIDAZOLAM HCL 10 MG/2ML IJ SOLN
INTRAMUSCULAR | Status: AC
  Filled 2014-05-11: qty 4

## 2014-05-11 MED ORDER — FENTANYL CITRATE 0.05 MG/ML IJ SOLN
INTRAMUSCULAR | Status: DC | PRN
  Administered 2014-05-11: 12.5 ug via INTRAVENOUS
  Administered 2014-05-11: 25 ug via INTRAVENOUS
  Administered 2014-05-11: 12.5 ug via INTRAVENOUS
  Administered 2014-05-11 (×2): 25 ug via INTRAVENOUS

## 2014-05-11 MED ORDER — FENTANYL CITRATE 0.05 MG/ML IJ SOLN
INTRAMUSCULAR | Status: AC
  Filled 2014-05-11: qty 2

## 2014-05-11 MED ORDER — MIDAZOLAM HCL 5 MG/5ML IJ SOLN
INTRAMUSCULAR | Status: DC | PRN
  Administered 2014-05-11 (×2): 2 mg via INTRAVENOUS
  Administered 2014-05-11: 1 mg via INTRAVENOUS
  Administered 2014-05-11: 2 mg via INTRAVENOUS

## 2014-05-11 NOTE — Op Note (Addendum)
05/11/2014  8:33 AM  PATIENT:  Sydney Cervantes, 53 y.o., female, MRN: [id number]  PREOP DIAGNOSIS:  History of cecal carcinoid, Past age 57 for screening colonoscopy  POSTOP DIAGNOSIS:   Cecum looks fairly normal, mild sigmoid colon diverticulosis  PROCEDURE:   Procedure(s): COLONOSCOPY  SURGEON:   Alphonsa Overall, M.D.   ANESTHESIA:   IV sedation  No anesthesia staff entered.  Moderate Sedation   SPECIMEN:   None  INDICATIONS FOR PROCEDURE:  Sydney Cervantes is a 53 y.o. (DOB: 12-21-1960) white  female whose primary care physician is Hayden Rasmussen., MD and comes for colonoscopy.  She has a history of a cecal carcinoid resected in 1987.  She has a more recent diagnosis of a breast cancer.  And she is over 71 years of age.  So this is both a diagnostic and screening colonoscopy.     The indications and risks of colonoscopy were explained to the patient.  The risks include, but are not limited to, perforation of the bowel and bleeding.  OPERATIVE NOTE:  The patient was taken to room # 3 in the [facility] endoscopy suite.  The patient was monitored with pulse oximetry, blood pressure cuff, and cardiac monitor.  The had 2 liters of nasal O2 during the procedure.  A time out was held and the checklist reviewed.   The patient was sedated with 100 mcgm of Fentanyl and 7 mgm of Versed.   A digital rectal exam was done at the beginning of the procedure..  The anus and rectum were unremarkable.     The flexible Pentax colonoscope was passed up the rectum without difficulty.  The scope was advanced to the cecum and the ileocecal valve was identified.  I did not find evidence of a prior colon resection and wonder whether they did not do an extended appendectomy for her carcinoid in 1987. The colonic prep was moderate.   The right colon, transverse colon, left colon, and sigmoid colon were unremarkable.   She did have some scattered diverticulosis.   The scope was withdrawn into the rectum and retroflexed.   The rectum was unremarkable.  Photos were taken during the procedure and placed in the chart.   The patient was taken to the recovery area of the [facility] endoscopy in good condition.  Her daughter, Burman Nieves, was with the patient and I explained the findings of the procedure.   The patient's next colonoscopy should be in 7 years (when she turns 21).  Alphonsa Overall, MD, [facility] Pager: [phone] Office phone:  [phone]

## 2014-05-11 NOTE — Discharge Instructions (Addendum)
Moderate Sedation, Adult °Moderate sedation is given to help you relax or even sleep through a procedure. You may remain sleepy, be clumsy, or have poor balance for several hours following this procedure. Arrange for a responsible adult, family member, or friend to take you home. A responsible adult should stay with you for at least 24 hours or until the medicines have worn off. °· Do not participate in any activities where you could become injured for the next 24 hours, or until you feel normal again. Do not: °· Drive. °· Swim. °· Ride a bicycle. °· Operate heavy machinery. °· Cook. °· Use power tools. °· Climb ladders. °· Work at heights. °· Do not make important decisions or sign legal documents until you are improved. °· Vomiting may occur if you eat too soon. When you can drink without vomiting, try water, juice, or soup. Try solid foods if you feel little or no nausea. °· Only take over-the-counter or prescription medications for pain, discomfort, or fever as directed by your caregiver.If pain medications have been prescribed for you, ask your caregiver how soon it is safe to take them. °· Make sure you and your family fully understands everything about the medication given to you. Make sure you understand what side effects may occur. °· You should not drink alcohol, take sleeping pills, or medications that cause drowsiness for at least 24 hours. °· If you smoke, do not smoke alone. °· If you are feeling better, you may resume normal activities 24 hours after receiving sedation. °· Keep all appointments as scheduled. Follow all instructions. °· Ask questions if you do not understand. °SEEK MEDICAL CARE IF:  °· Your skin is pale or bluish in color. °· You continue to feel sick to your stomach (nauseous) or throw up (vomit). °· Your pain is getting worse and not helped by medication. °· You have bleeding or swelling. °· You are still sleepy or feeling clumsy after 24 hours. °SEEK IMMEDIATE MEDICAL CARE IF:   °· You develop a rash. °· You have difficulty breathing. °· You develop any type of allergic problem. °· You have a fever. °Document Released: 08/18/2001 Document Revised: 02/15/2012 Document Reviewed: 07/31/2013 °ExitCare® Patient Information ©2014 [employer]. ° °

## 2014-05-11 NOTE — H&P (Signed)
Re: Sydney Cervantes  DOB: [birth year]  MRN: [id number]   Sydney Cervantes   ASSESSMENT AND PLAN:  1. Left breast cancer. T4, N3, Mx, at 11 o'clock.   Original [place] - 4.5 cm mass, MRI - 7.6 cm (01/19/2012) mass extending to pectoralis muscle.  Lobular, grade 3. ER - 62%, PR - 5%, Her2Neu - neg., Ki67 - 33   Treating oncology - Magrinat (she did see Rubin)/[facility].   Neoadjuvant chemotx finished 08/02/2012.   Left MRM - 08/25/2012 - Sydney Cervantes. Final Path - no residual cancer in the breast, but 7/8 nodes positive.   She finished radiation tx in December 2013   Left latissimus flap with saline implant by Dr. Harlow Cervantes 01/11/2014. She is disease free.   I will see her in 6 months.   2. Level 1, 2 and 3 nodes positive - by MRI.  3. 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  4. Power port - Removed [facility]/22/2013 at [facility].  5. BRCA 1/2 neg - April 2013.   CancerNext panel (14 genes) was negative. 07/14/2012  6. 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  7. She reminded me that it has been > 10 years since her last colonoscopy, she is > 16 yo, she had breast cancer. So she needs a colonoscopy. She cannot remember who did her last colonoscopy, so I will set this up to do.   Daughter - Sydney Cervantes - with patient.  Her husband had a knee scope yesterday.   REFERRING PHYSICIAN: Dag Cervantes, Sydney Cervantes.   HISTORY OF PRESENT ILLNESS:  Sydney Cervantes is a 53 y.o. (DOB: Mar 23, 1961) whtie female whose primary care physician is Sydney Coder, NP, [facility], and comes to me today for follow up of left breast cancer and left MRM which I did 08/25/2012.   Her husband is with her.    She is doing well. She had reconstruction of the left breast by Dr. Harlow Cervantes. She had a reduction mammoplasty on the right.   She is tolerating the Tamoxifen well.   We talked about a colonoscopy. I gave her a bowel prep and a sheet on colonoscopies. She has been through this before, but it has been a while.   Breast family history:  Her mother had  breast cancer in her 30's. She then had a recurrence. She is still living.   Past Medical History   Diagnosis  Date   .  Hx of colonoscopy  2000   .  Wears glasses    .  Seasonal allergies      "when molds and things come out; pollens" (08/25/2012)   .  Breast cancer      left   .  Anemia      "chemo induced" (08/25/2012)   .  Hx of radiation therapy  09/21/12 -11/17/12     R supraclav fossa, L chest wall/SCVL fossa/PAB mid axilla/IM nodes/scar boost   .  Hepatic steatosis  12/27/2013   .  GERD (gastroesophageal reflux disease)      occ; "side effect of chemo" (08/25/2012)    Current Outpatient Prescriptions   Medication  Sig  Dispense  Refill   .  acetaminophen (TYLENOL) 325 MG tablet  Take 650 mg by mouth every 6 (six) hours as needed. For pain     .  Calcium Carbonate-Vitamin D (CALTRATE 600+D PO)  Take 1 tablet by mouth daily.     .  tamoxifen (NOLVADEX) 20 MG tablet  Take 1  tablet (20 mg total) by mouth daily.  90 tablet  3    No current facility-administered medications for this visit.    Allergies   Allergen  Reactions   .  Adhesive [Tape]  Itching and Rash     Rash and itching at steristrip and EKG lead pad sites   .  Codeine      Headache   REVIEW OF SYSTEMS:  Gastrointestinal: Lap chole - 2000's by Dr. Marlou Cervantes, Appendiceal carcinoid - 1987, last colonoscopy about 2000.   SOCIAL and FAMILY HISTORY:  Married. Husband, Sydney Cervantes, with patient.  They have 3 daughters - 22,19, 21. (One daughter made me a pink bracelet) (Two of her daughters went to [place])  She works from home now.   PHYSICAL EXAM:  BP 126/78  Pulse 75  Temp(Src) 97.6 F (36.4 C)  Ht 5' 8" (1.727 m)  Wt 162 lb 9.6 oz (73.755 kg)  BMI 24.73 kg/m2  General: WN WF who is alert and generally healthy appearing.  Neck: No thyroid mass  Lymph Nodes: No cervical, supraclavicular, or axillary noder  Breasts: Left: Reconstructed with latissimus flap and implant.  Right: No mass port scar  okay.  Extremities: Has some lymphedema of left upper extremity.   DATA REVIEWED:  Notes in [place], MD, [facility], [address] [place], [place]  Phone: [place]: [phone]

## 2014-05-14 ENCOUNTER — Encounter (HOSPITAL_COMMUNITY): Payer: Self-pay | Admitting: Surgery

## 2014-05-16 ENCOUNTER — Ambulatory Visit: Payer: Commercial Managed Care - PPO | Admitting: Physical Therapy

## 2014-05-18 ENCOUNTER — Ambulatory Visit: Payer: Commercial Managed Care - PPO | Admitting: Physical Therapy

## 2014-05-21 ENCOUNTER — Ambulatory Visit: Payer: Commercial Managed Care - PPO | Admitting: Physical Therapy

## 2014-05-23 ENCOUNTER — Ambulatory Visit: Payer: Commercial Managed Care - PPO

## 2014-05-28 ENCOUNTER — Ambulatory Visit: Payer: Commercial Managed Care - PPO

## 2014-05-30 ENCOUNTER — Ambulatory Visit: Payer: Commercial Managed Care - PPO

## 2014-06-04 ENCOUNTER — Ambulatory Visit: Payer: Commercial Managed Care - PPO | Admitting: Physical Therapy

## 2014-06-18 ENCOUNTER — Other Ambulatory Visit (HOSPITAL_BASED_OUTPATIENT_CLINIC_OR_DEPARTMENT_OTHER): Payer: Commercial Managed Care - PPO

## 2014-06-18 DIAGNOSIS — C773 Secondary and unspecified malignant neoplasm of axilla and upper limb lymph nodes: Secondary | ICD-10-CM

## 2014-06-18 DIAGNOSIS — Z853 Personal history of malignant neoplasm of breast: Secondary | ICD-10-CM

## 2014-06-18 DIAGNOSIS — C50919 Malignant neoplasm of unspecified site of unspecified female breast: Secondary | ICD-10-CM

## 2014-06-18 LAB — COMPREHENSIVE METABOLIC PANEL (CC13)
ALT: 50 U/L (ref 0–55)
ANION GAP: 11 meq/L (ref 3–11)
AST: 40 U/L — AB (ref 5–34)
Albumin: 4 g/dL (ref 3.5–5.0)
Alkaline Phosphatase: 92 U/L (ref 40–150)
BUN: 11.2 mg/dL (ref 7.0–26.0)
CALCIUM: 9.7 mg/dL (ref 8.4–10.4)
CHLORIDE: 109 meq/L (ref 98–109)
CO2: 24 mEq/L (ref 22–29)
CREATININE: 0.8 mg/dL (ref 0.6–1.1)
Glucose: 93 mg/dl (ref 70–140)
Potassium: 3.7 mEq/L (ref 3.5–5.1)
SODIUM: 144 meq/L (ref 136–145)
TOTAL PROTEIN: 6.7 g/dL (ref 6.4–8.3)
Total Bilirubin: 0.52 mg/dL (ref 0.20–1.20)

## 2014-06-18 LAB — CBC WITH DIFFERENTIAL/PLATELET
BASO%: 0.4 % (ref 0.0–2.0)
Basophils Absolute: 0 10*3/uL (ref 0.0–0.1)
EOS ABS: 0.2 10*3/uL (ref 0.0–0.5)
EOS%: 4.5 % (ref 0.0–7.0)
HCT: 42.1 % (ref 34.8–46.6)
HGB: 14.2 g/dL (ref 11.6–15.9)
LYMPH%: 25.1 % (ref 14.0–49.7)
MCH: 32.5 pg (ref 25.1–34.0)
MCHC: 33.7 g/dL (ref 31.5–36.0)
MCV: 96.3 fL (ref 79.5–101.0)
MONO#: 0.5 10*3/uL (ref 0.1–0.9)
MONO%: 10.8 % (ref 0.0–14.0)
NEUT#: 2.8 10*3/uL (ref 1.5–6.5)
NEUT%: 59.2 % (ref 38.4–76.8)
NRBC: 0 % (ref 0–0)
Platelets: 130 10*3/uL — ABNORMAL LOW (ref 145–400)
RBC: 4.37 10*6/uL (ref 3.70–5.45)
RDW: 13.2 % (ref 11.2–14.5)
WBC: 4.7 10*3/uL (ref 3.9–10.3)
lymph#: 1.2 10*3/uL (ref 0.9–3.3)

## 2014-06-21 ENCOUNTER — Other Ambulatory Visit: Payer: Self-pay | Admitting: *Deleted

## 2014-06-21 DIAGNOSIS — C50519 Malignant neoplasm of lower-outer quadrant of unspecified female breast: Secondary | ICD-10-CM

## 2014-06-21 MED ORDER — TAMOXIFEN CITRATE 20 MG PO TABS: 20.0000 mg | ORAL_TABLET | Freq: Every day | ORAL | Status: DC

## 2014-06-25 ENCOUNTER — Ambulatory Visit: Payer: Commercial Managed Care - PPO | Admitting: Oncology

## 2014-07-10 ENCOUNTER — Telehealth: Payer: Self-pay | Admitting: Oncology

## 2014-07-10 ENCOUNTER — Telehealth: Payer: Self-pay

## 2014-07-10 ENCOUNTER — Ambulatory Visit (HOSPITAL_BASED_OUTPATIENT_CLINIC_OR_DEPARTMENT_OTHER): Payer: Commercial Managed Care - PPO | Admitting: Oncology

## 2014-07-10 VITALS — BP 121/79 | HR 83 | Temp 98.6°F | Resp 18 | Ht 68.0 in | Wt 159.5 lb

## 2014-07-10 DIAGNOSIS — C50919 Malignant neoplasm of unspecified site of unspecified female breast: Secondary | ICD-10-CM

## 2014-07-10 DIAGNOSIS — C50512 Malignant neoplasm of lower-outer quadrant of left female breast: Secondary | ICD-10-CM

## 2014-07-10 DIAGNOSIS — C50519 Malignant neoplasm of lower-outer quadrant of unspecified female breast: Secondary | ICD-10-CM

## 2014-07-10 DIAGNOSIS — C773 Secondary and unspecified malignant neoplasm of axilla and upper limb lymph nodes: Secondary | ICD-10-CM

## 2014-07-10 DIAGNOSIS — Z170 Estrogen receptor positive status [ER+]: Secondary | ICD-10-CM

## 2014-07-10 DIAGNOSIS — R748 Abnormal levels of other serum enzymes: Secondary | ICD-10-CM

## 2014-07-10 DIAGNOSIS — K760 Fatty (change of) liver, not elsewhere classified: Secondary | ICD-10-CM

## 2014-07-10 MED ORDER — TAMOXIFEN CITRATE 20 MG PO TABS: 20.0000 mg | ORAL_TABLET | Freq: Every day | ORAL | Status: DC

## 2014-07-10 NOTE — Progress Notes (Signed)
ID: Sydney Cervantes   DOB: Apr 03, 1961  MR#: [id number]  DVV#:[id number]  GYN:  [name], NP  SU:  Dr. Alphonsa Overall  OTHER MD: Crissie Reese, MD:  Lance Morin, MD  CHIEF COMPLAINT:  Hx of Left Breast Cancer  CURRENT TREATMENT: Tamoxifen   BREAST CANCER HISTORY: From Dr. Rada Hay prior summary note:  1. 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  ER/PR positive at 63/5% respectively, HER-2 negative, elevated Ki-67. Completed 4/6 planned neoadjuvant dose dense FEC with Neulasta support on day 2. 20% dose reduction on 5-FU, 50% dose reduction on epirubicin, 40% dose reduction on Cytoxan, after one-week delay due to anemia/thrombocytopenia/and region of probable localized thrombophlebitis versus mild chemotherapy extravasation on the left forearm, covered with Keflex 500 mg by mouth 3 times a day. Switched to single agent neoadjuvant Taxol, given 3 weeks on 1 week off, completed 4 cycles on 08/02/2012.   2. Status post left modified radical mastectomy on 08/25/2012 with final pathology showing no tumor in the primary tumor bed with 7 of 8 involved lymph nodes. The final pathology showed an ER positive tumor, HER-2 negative.  She completed radiation on 11/18/12 and started taking Tamoxifen on 11/21/2012.  It is also noted that she is BRCA negative.   Her subsequent history is as detailed below  INTERVAL HISTORY:  Sydney Cervantes returns today accompanied by her husband Sydney Cervantes for followup of her left breast carcinoma. The interval history is unremarkable. She continues on tamoxifen, with excellent tolerance. She also completed her reconstruction, with a latissimus flap to the left chest and a right mastopexy for symmetry. She tolerated the surgery without unusual bleeding, pain, fever, or other complications.  REVIEW OF SYSTEMS: Sydney Cervantes is tolerating the tamoxifen with minimal hot flashes, no vaginal  wetness, and no other concerns that she is aware of. She has not had a period since March of 2013. She does understand that tamoxifen in itself is not a contraceptive. She is not exercising regularly. She does do you have a once a week. The left upper extremity lymphedema is very stable. A detailed review of systems today was otherwise entirely negative and in particular she denies unusual headaches, visual changes, nausea, vomiting, dizziness, gait imbalance, cough, phlegm production, pleurisy, shortness of breath, chest pain or pressure, or any change in bowel or bladder habits. There have been no unexplained weight loss or unexplained fatigue.   PAST MEDICAL HISTORY: Past Medical History  Diagnosis Date  . Hx of colonoscopy 2000  . Wears glasses   . Seasonal allergies     "when molds and things come out; pollens" (08/25/2012)  . Breast cancer     left  . Anemia     "chemo induced" (08/25/2012)  . Hx of radiation therapy 09/21/12 -11/17/12    R supraclav fossa, L chest wall/SCVL fossa/PAB mid axilla/IM nodes/scar boost  . Hepatic steatosis 12/27/2013  . GERD (gastroesophageal reflux disease)     occ; "side effect of chemo" (08/25/2012)  . Carcinoid tumor of colon, benign     partial colectomy ,1987    PAST SURGICAL HISTORY: Past Surgical History  Procedure Laterality Date  . 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 Portacath placement  02/11/2012    Procedure: INSERTION PORT-A-CATH;  Surgeon: Shann Medal, MD;  Location: [facility];  Service: General;  Laterality: Right;  Subclavian  . Appendectomy  1987  .  Cholecystectomy  2003  . Dilation and curettage of uterus  1992  . Mastectomy modified radical  08/25/2012    left  . Breast biopsy  01/2012    left  . Latissimus flap to breast Left 01/11/2014    Procedure: LEFT LATISSIMUS FLAP TO BREAST WITH SALINE IMPLANT FOR RECONSTRUCTION;  Surgeon: Etter Sjogren, MD;  Location: [facility] OR;  Service: Plastics;  Laterality: Left;  . Breast  reduction surgery Right 01/11/2014    Procedure: RIGHT BREAST REDUCTION ;  Surgeon: Etter Sjogren, MD;  Location: [facility] OR;  Service: Plastics;  Laterality: Right;  . Colonoscopy N/A 05/11/2014    Procedure: COLONOSCOPY;  Surgeon: Kandis Cocking, MD;  Location: [facility];  Service: General;  Laterality: N/A;    FAMILY HISTORY Family History  Problem Relation Age of Onset  . Breast cancer Mother   . Breast cancer Paternal Aunt   . Cancer Maternal Grandmother     Hodgkins Disease    GYNECOLOGIC HISTORY:   G3P2.  Menarche at 53 years of age.  Followed by Sydney Dus, NP.    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 Daughter Sydney Cervantes currently 54 years old is a Buyer, retail in psychology and exercise physiology hoping to become an occupational therapist. Daughter Sydney Cervantes studied Investment banker, corporate at [employer] and hopes to be in government. Daughter Sydney Cervantes is currently at [employer] education.    HEALTH MAINTENANCE:  (Updated January 2015) History  Substance Use Topics  . Smoking status: Never Smoker   . Smokeless tobacco: Never Used  . Alcohol Use: 0.0 oz/week    7-10 Glasses of wine per week     Comment: 06/26/2013 - 1-1.5 glasses of wine nightly     COLONOSCOPY: Approx 2000  PAP:  Not on file  BONE DENSITY:  Never  LIPIDS:  Not on file     Allergies  Allergen Reactions  . Adhesive [Tape] Itching and Rash    Rash and itching at steristrip and EKG lead pad sites  . Codeine     Headache    Current Outpatient Prescriptions  Medication Sig Dispense Refill  . acetaminophen (TYLENOL) 325 MG tablet Take 650 mg by mouth every 6 (six) hours as needed. For pain      . Calcium Carbonate-Vitamin D (CALTRATE 600+D PO) Take 1 tablet by mouth daily.       . tamoxifen  (NOLVADEX) 20 MG tablet Take 1 tablet (20 mg total) by mouth daily.  90 tablet  0   No current facility-administered medications for this visit.    OBJECTIVE: Middle-aged white woman in no acute distress Filed Vitals:   07/10/14 1040  BP: 121/79  Pulse: 83  Temp: 98.6 F (37 C)  Resp: 18     Body mass index is 24.26 kg/(m^2).    ECOG FS: 0 Filed Weights   07/10/14 1040  Weight: 159 lb 8 oz (72.349 kg)   Sclerae unicteric, pupils equal and reactive Oropharynx clear and moist-- teeth in good repair No cervical or supraclavicular adenopathy Lungs no rales or rhonchi Heart regular rate and rhythm Abd soft, nontender, positive bowel sounds MSK no focal spinal tenderness, chronic left upper extremity lymphedema, grade 1, stable, sleeve in place Neuro: nonfocal, well oriented, positive affect Breasts: The left breast is status post mastectomy with flap  reconstruction. There is no evidence of local recurrence. The left axilla is benign. The right breast is status post reduction mammoplasty. There are no suspicious findings. The right axilla is benign    LAB RESULTS: Lab Results  Component Value Date   WBC 4.7 06/18/2014   NEUTROABS 2.8 06/18/2014   HGB 14.2 06/18/2014   HCT 42.1 06/18/2014   MCV 96.3 06/18/2014   PLT 130* 06/18/2014      Chemistry      Component Value Date/Time   NA 144 06/18/2014 1000   NA 145 01/02/2014 1337   K 3.7 06/18/2014 1000   K 4.5 01/02/2014 1337   CL 105 01/02/2014 1337   CL 108* 03/20/2013 1438   CO2 24 06/18/2014 1000   CO2 26 01/02/2014 1337   BUN 11.2 06/18/2014 1000   BUN 14 01/02/2014 1337   CREATININE 0.8 06/18/2014 1000   CREATININE 0.73 01/02/2014 1337      Component Value Date/Time   CALCIUM 9.7 06/18/2014 1000   CALCIUM 9.4 01/02/2014 1337   ALKPHOS 92 06/18/2014 1000   ALKPHOS 62 06/21/2012 1259   AST 40* 06/18/2014 1000   AST 42* 06/21/2012 1259   ALT 50 06/18/2014 1000   ALT 45* 06/21/2012 1259   BILITOT 0.52 06/18/2014 1000   BILITOT 0.5  06/21/2012 1259       STUDIES:  [facility]amm[facility] at [facility] 07/05/2014 found the breast density to be category B. a new round nodular density but on an obscured margin was noted in the right breast posteriorly. The patient was recalled for additional views including tomography and this was felt to be scar from the recent reconstruction.   ASSESSMENT:  53 y.o. BRCA negative Sydney Cervantes woman  (1) s/p left breast and left axillary lymph node biopsies 01/18/2012 for a clinical T4 N3, stage IIIC invasive lobular carcinoma, grade 3, estrogen receptor 62% positive, progesterone receptor 5% positive, with an MIB-1 of 33% and no HER-2 amplification.  (2) Began chemotherapy in March 2013, and received 4/6 planned neoadjuvant cycles of dose dense cyclophosphamide, epirubicin and fluorouracil followed by weekly paclitaxel x 12 completed Taxol 08/02/2012.   (3) left modified radical mastectomy 08/25/2012 showed no residual tumor in the breast, but 7/8 sampled lymph nodes were positive, 6 with macro metastases. Repeat HER-2 was again negative.  (4) She completed radiation on 11/18/12   (5) started tamoxifen on 11/21/2012.  (6) on 01/11/2014 underwent left latissimus dorsi myocutaneous flap reconstruction of the left breast, with saline implant, and a right breast mammographic C4 symmetry  (7) history of a cecal carcinoid resected 1987, with an unremarkable colonoscopy 05/14/2014  (8) hepatic steatosis, with desirable AST elevation: no trend noted  PLAN: Dezhane is is doing well as far as breast cancer is concerned, now just about 2 years out from her left modified radical mastectomy, with no evidence of disease recurrence. We discussed some of the new medications available for stage IV breast cancer such as everolimus and Palbociclib. She understands these have not yet been brought to the adjuvant setting.  She may consider switching to anastrozole. I would like to have a repeat bone density before the next  visit to help [place] with that decision. I will also obtain an [place] and estradiol level.  Wrenley has a good understanding of the overall plan. She agrees with that. She knows the goal of treatment in her case is cure. She will call with any problems that may develop before her next visit here.  Chauncey Cruel, MD  07/10/2014   

## 2014-07-10 NOTE — Telephone Encounter (Signed)
left message that appt 12/11/14 @ 1:00 pm @ [facility]

## 2014-07-10 NOTE — Telephone Encounter (Signed)
per pof to sch pt appt-gave pt copy of sch °

## 2014-07-10 NOTE — Telephone Encounter (Signed)
per pof to sch pt appt-sch pt BD @ [facility]-cld pt to give pt appt time & date-will mail cp[y pf sch

## 2014-07-10 NOTE — Progress Notes (Signed)
SH: Updating this social history, the patient tells me her oldest daughter graduated from college and is interested in working in political, pains, although she does not seem to have a strong radiology one way or the other; the second daughter is back in graduate school, studying occupational therapy and the third.he is studying music education at [employer].

## 2014-07-10 NOTE — Telephone Encounter (Signed)
Faxed order to [facility] for addnl imaging.  Sent to scan.

## 2014-10-08 ENCOUNTER — Encounter (HOSPITAL_COMMUNITY): Payer: Self-pay | Admitting: Surgery

## 2015-01-08 ENCOUNTER — Other Ambulatory Visit (HOSPITAL_BASED_OUTPATIENT_CLINIC_OR_DEPARTMENT_OTHER): Payer: Commercial Managed Care - PPO

## 2015-01-08 DIAGNOSIS — R748 Abnormal levels of other serum enzymes: Secondary | ICD-10-CM

## 2015-01-08 DIAGNOSIS — C50819 Malignant neoplasm of overlapping sites of unspecified female breast: Secondary | ICD-10-CM

## 2015-01-08 DIAGNOSIS — C50512 Malignant neoplasm of lower-outer quadrant of left female breast: Secondary | ICD-10-CM

## 2015-01-08 DIAGNOSIS — C50519 Malignant neoplasm of lower-outer quadrant of unspecified female breast: Secondary | ICD-10-CM

## 2015-01-08 DIAGNOSIS — K760 Fatty (change of) liver, not elsewhere classified: Secondary | ICD-10-CM

## 2015-01-08 LAB — CBC WITH DIFFERENTIAL/PLATELET
BASO%: 1.2 % (ref 0.0–2.0)
Basophils Absolute: 0.1 10*3/uL (ref 0.0–0.1)
EOS%: 5.2 % (ref 0.0–7.0)
Eosinophils Absolute: 0.2 10*3/uL (ref 0.0–0.5)
HCT: 43.4 % (ref 34.8–46.6)
HGB: 13.6 g/dL (ref 11.6–15.9)
LYMPH#: 1.2 10*3/uL (ref 0.9–3.3)
LYMPH%: 26.1 % (ref 14.0–49.7)
MCH: 30.9 pg (ref 25.1–34.0)
MCHC: 31.4 g/dL — ABNORMAL LOW (ref 31.5–36.0)
MCV: 98.7 fL (ref 79.5–101.0)
MONO#: 0.4 10*3/uL (ref 0.1–0.9)
MONO%: 9.3 % (ref 0.0–14.0)
NEUT#: 2.6 10*3/uL (ref 1.5–6.5)
NEUT%: 58.2 % (ref 38.4–76.8)
PLATELETS: 145 10*3/uL (ref 145–400)
RBC: 4.39 10*6/uL (ref 3.70–5.45)
RDW: 14 % (ref 11.2–14.5)
WBC: 4.5 10*3/uL (ref 3.9–10.3)

## 2015-01-08 LAB — COMPREHENSIVE METABOLIC PANEL (CC13)
ALT: 46 U/L (ref 0–55)
AST: 36 U/L — ABNORMAL HIGH (ref 5–34)
Albumin: 3.9 g/dL (ref 3.5–5.0)
Alkaline Phosphatase: 96 U/L (ref 40–150)
Anion Gap: 9 mEq/L (ref 3–11)
BILIRUBIN TOTAL: 0.33 mg/dL (ref 0.20–1.20)
BUN: 16.4 mg/dL (ref 7.0–26.0)
CALCIUM: 8.8 mg/dL (ref 8.4–10.4)
CO2: 29 mEq/L (ref 22–29)
CREATININE: 0.8 mg/dL (ref 0.6–1.1)
Chloride: 105 mEq/L (ref 98–109)
EGFR: 84 mL/min/{1.73_m2} — ABNORMAL LOW (ref >90)
GLUCOSE: 106 mg/dL (ref 70–140)
Potassium: 3.8 mEq/L (ref 3.5–5.1)
Sodium: 143 mEq/L (ref 136–145)
TOTAL PROTEIN: 6.2 g/dL — AB (ref 6.4–8.3)

## 2015-01-09 LAB — CANCER ANTIGEN 27.29: CA 27.29: 22 U/mL (ref 0–39)

## 2015-01-09 LAB — VITAMIN D 25 HYDROXY (VIT D DEFICIENCY, FRACTURES): Vit D, 25-Hydroxy: 35 ng/mL (ref 30–100)

## 2015-01-09 LAB — FOLLICLE STIMULATING HORMONE: FSH: 24.2 m[IU]/mL

## 2015-01-12 LAB — ESTRADIOL, ULTRA SENS: Estradiol, Ultra Sensitive: <2 pg/mL

## 2015-01-15 ENCOUNTER — Ambulatory Visit (HOSPITAL_BASED_OUTPATIENT_CLINIC_OR_DEPARTMENT_OTHER): Payer: Commercial Managed Care - PPO | Admitting: Nurse Practitioner

## 2015-01-15 ENCOUNTER — Other Ambulatory Visit: Payer: Self-pay | Admitting: *Deleted

## 2015-01-15 ENCOUNTER — Telehealth: Payer: Self-pay | Admitting: Oncology

## 2015-01-15 VITALS — BP 125/76 | HR 105 | Temp 98.3°F | Resp 18 | Ht 68.0 in | Wt 159.2 lb

## 2015-01-15 DIAGNOSIS — M8580 Other specified disorders of bone density and structure, unspecified site: Secondary | ICD-10-CM

## 2015-01-15 DIAGNOSIS — K760 Fatty (change of) liver, not elsewhere classified: Secondary | ICD-10-CM

## 2015-01-15 DIAGNOSIS — C50519 Malignant neoplasm of lower-outer quadrant of unspecified female breast: Secondary | ICD-10-CM

## 2015-01-15 DIAGNOSIS — C773 Secondary and unspecified malignant neoplasm of axilla and upper limb lymph nodes: Secondary | ICD-10-CM

## 2015-01-15 DIAGNOSIS — C50512 Malignant neoplasm of lower-outer quadrant of left female breast: Secondary | ICD-10-CM

## 2015-01-15 DIAGNOSIS — Z170 Estrogen receptor positive status [ER+]: Secondary | ICD-10-CM

## 2015-01-15 MED ORDER — TAMOXIFEN CITRATE 20 MG PO TABS: 20.0000 mg | ORAL_TABLET | Freq: Every day | ORAL | Status: DC

## 2015-01-15 NOTE — Progress Notes (Signed)
ID: Sydney Cervantes   DOB: January 08, 1961  MR#: [id number]  NUU#:[id number]  GYN:  [name], NP  SU:  Dr. Alphonsa Overall  OTHER MD: Crissie Reese, MD:  Lance Morin, MD  CHIEF COMPLAINT:  Hx of Left Breast Cancer  CURRENT TREATMENT: Tamoxifen   BREAST CANCER HISTORY: From Dr. Rada Hay prior summary note:  1. 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  ER/PR positive at 63/5% respectively, HER-2 negative, elevated Ki-67. Completed 4/6 planned neoadjuvant dose dense FEC with Neulasta support on day 2. 20% dose reduction on 5-FU, 50% dose reduction on epirubicin, 40% dose reduction on Cytoxan, after one-week delay due to anemia/thrombocytopenia/and region of probable localized thrombophlebitis versus mild chemotherapy extravasation on the left forearm, covered with Keflex 500 mg by mouth 3 times a day. Switched to single agent neoadjuvant Taxol, given 3 weeks on 1 week off, completed 4 cycles on 08/02/2012.   2. Status post left modified radical mastectomy on 08/25/2012 with final pathology showing no tumor in the primary tumor bed with 7 of 8 involved lymph nodes. The final pathology showed an ER positive tumor, HER-2 negative.  She completed radiation on 11/18/12 and started taking Tamoxifen on 11/21/2012.  It is also noted that she is BRCA negative.   Her subsequent history is as detailed below  INTERVAL HISTORY:  Sydney Cervantes returns today accompanied by her husband [name] for follow up of her left breast carcinoma. She has been on tamoxifen since December 2013 and is tolerating it relatively well. She has occasional hot flashes, but denies vaginal changes or joint aches. The interval history is generally unremarkably. She continues to participate in yoga a few times a week for exercise. Her energy level has actually improved since doing so.  REVIEW OF SYSTEMS: Sydney Cervantes denies fevers, chills,  nausea, vomiting, or changes in bowel or bladder habits. She has no shortness of breath, chest pain, cough, or palpitations. She is eating, drinking, and sleeping well. She has no headaches, dizziness, unexplained weight loss, bruising or bleeding. A detailed review of systems is otherwise entirely negative.    PAST MEDICAL HISTORY: Past Medical History  Diagnosis Date  . Hx of colonoscopy 2000  . Wears glasses   . Seasonal allergies     "when molds and things come out; pollens" (08/25/2012)  . Breast cancer     left  . Anemia     "chemo induced" (08/25/2012)  . Hx of radiation therapy 09/21/12 -11/17/12    R supraclav fossa, L chest wall/SCVL fossa/PAB mid axilla/IM nodes/scar boost  . Hepatic steatosis 12/27/2013  . GERD (gastroesophageal reflux disease)     occ; "side effect of chemo" (08/25/2012)  . Carcinoid tumor of colon, benign     partial colectomy ,1987    PAST SURGICAL HISTORY: Past Surgical History  Procedure Laterality Date  . 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 Portacath placement  02/11/2012    Procedure: INSERTION PORT-A-CATH;  Surgeon: Shann Medal, MD;  Location: [place];  Service: General;  Laterality: Right;  Subclavian  . Appendectomy  1987  . Cholecystectomy  2003  . Dilation and curettage of uterus  1992  . Mastectomy modified radical  08/25/2012    left  . Breast biopsy  01/2012    left  . Latissimus flap to breast Left 01/11/2014    Procedure: LEFT LATISSIMUS FLAP TO BREAST WITH SALINE IMPLANT  FOR RECONSTRUCTION;  Surgeon: Crissie Reese, MD;  Location: [place];  Service: Plastics;  Laterality: Left;  . Breast reduction surgery Right 01/11/2014    Procedure: RIGHT BREAST REDUCTION ;  Surgeon: Crissie Reese, MD;  Location: [place];  Service: Plastics;  Laterality: Right;  . Colonoscopy N/A 05/11/2014    Procedure: COLONOSCOPY;  Surgeon: Shann Medal, MD;  Location: [facility];  Service: General;  Laterality: N/A;    FAMILY HISTORY Family History   Problem Relation Age of Onset  . Breast cancer Mother   . Breast cancer Paternal Aunt   . Cancer Maternal Grandmother     Hodgkins Disease    GYNECOLOGIC HISTORY:   G3P2.  Menarche at 54 years of age.  Followed by [name], NP.    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 Scott works in Engineer, technical sales for [employer]. Daughter Joellen Jersey currently 28 years old is a Environmental education officer in psychology and exercise physiology hoping to become an occupational therapist. Daughter Raquel Sarna studied Therapist, occupational at [employer]	[place] and hopes to be in [place]. Daughter Burman Nieves is currently at [employer] education.    HEALTH MAINTENANCE:  (Updated January 2015) History  Substance Use Topics  . Smoking status: Never Smoker   . Smokeless tobacco: Never Used  . Alcohol Use: 0.0 oz/week    7-10 Glasses of wine per week     Comment: 06/26/2013 - 1-1.5 glasses of wine nightly     COLONOSCOPY: Approx 2000  PAP:  Not on file  BONE DENSITY:  Never  LIPIDS:  Not on file     Allergies  Allergen Reactions  . Adhesive [Tape] Itching and Rash    Rash and itching at steristrip and EKG lead pad sites  . Codeine     Headache    Current Outpatient Prescriptions  Medication Sig Dispense Refill  . Calcium Carbonate-Vitamin D (CALTRATE 600+D PO) Take 1 tablet by mouth daily.     . Probiotic Product (PROBIOTIC DAILY PO) Take 2 capsules by mouth daily.    Marland Kitchen ibuprofen (ADVIL,MOTRIN) 400 MG tablet Take 400 mg by mouth.    . tamoxifen (NOLVADEX) 20 MG tablet Take 1 tablet (20 mg total) by mouth daily. 90 tablet 2   No current facility-administered medications for this visit.    OBJECTIVE: Middle-aged white woman in no acute distress Filed Vitals:   01/15/15 1402  BP: 125/76  Pulse: 105  Temp: 98.3 F (36.8 C)  Resp: 18     Body mass  index is 24.21 kg/(m^2).    ECOG FS: 0 Filed Weights   01/15/15 1402  Weight: 159 lb 3.2 oz (72.213 kg)   Skin: warm, dry  HEENT: sclerae anicteric, conjunctivae pink, oropharynx clear. No thrush or mucositis.  Lymph Nodes: No cervical or supraclavicular lymphadenopathy  Lungs: clear to auscultation bilaterally, no rales, wheezes, or rhonci  Heart: regular rate and rhythm  Abdomen: round, soft, non tender, positive bowel sounds  Musculoskeletal: No focal spinal tenderness, grade 1 left upper extremity lymphedema. Wearing sleeve. Neuro: non focal, well oriented, positive affect  Breasts: left breast status post mastectomy and reconstruction. No evidence of recurrent disease. Left axilla benign. Right breast status post reduction, otherwise unremarkable.  LAB RESULTS: Lab Results  Component Value Date   WBC 4.5 01/08/2015   NEUTROABS 2.6 01/08/2015   HGB 13.6 01/08/2015  HCT 43.4 01/08/2015   MCV 98.7 01/08/2015   PLT 145 01/08/2015      Chemistry      Component Value Date/Time   NA 143 01/08/2015 1407   NA 145 01/02/2014 1337   K 3.8 01/08/2015 1407   K 4.5 01/02/2014 1337   CL 105 01/02/2014 1337   CL 108* 03/20/2013 1438   CO2 29 01/08/2015 1407   CO2 26 01/02/2014 1337   BUN 16.4 01/08/2015 1407   BUN 14 01/02/2014 1337   CREATININE 0.8 01/08/2015 1407   CREATININE 0.73 01/02/2014 1337      Component Value Date/Time   CALCIUM 8.8 01/08/2015 1407   CALCIUM 9.4 01/02/2014 1337   ALKPHOS 96 01/08/2015 1407   ALKPHOS 62 06/21/2012 1259   AST 36* 01/08/2015 1407   AST 42* 06/21/2012 1259   ALT 46 01/08/2015 1407   ALT 45* 06/21/2012 1259   BILITOT 0.33 01/08/2015 1407   BILITOT 0.5 06/21/2012 1259       STUDIES: No results found.  Most recent mammogram on 07/09/14 was unremarkable.  Most recent bone density scan on 12/21/14 showed a t-score of-1.9 (osteopenia)  ASSESSMENT:  54 y.o. BRCA negative [place] woman  (1) s/p left breast and left axillary lymph  node biopsies 01/18/2012 for a clinical T4 N3, stage IIIC invasive lobular carcinoma, grade 3, estrogen receptor 62% positive, progesterone receptor 5% positive, with an MIB-1 of 33% and no HER-2 amplification.  (2) Began chemotherapy in March 2013, and received 4/6 planned neoadjuvant cycles of dose dense cyclophosphamide, epirubicin and fluorouracil followed by weekly paclitaxel x 12 completed Taxol 08/02/2012.   (3) left modified radical mastectomy 08/25/2012 showed no residual tumor in the breast, but 7/8 sampled lymph nodes were positive, 6 with macro metastases. Repeat HER-2 was again negative.  (4) She completed radiation on 11/18/12   (5) started tamoxifen on 11/21/2012.  (6) on 01/11/2014 underwent left latissimus dorsi myocutaneous flap reconstruction of the left breast, with saline implant, and a right breast mammographic C4 symmetry  (7) history of a cecal carcinoid resected 1987, with an unremarkable colonoscopy 05/14/2014  (8) hepatic steatosis, with desirable AST elevation: no trend noted  (9) osteopenia - t-score -1.9 pm 12/21/14  PLAN: Sydney Cervantes is doing well as far as her breast cancer is concerned. She is now 3 years out from her definitive surgery with no evidence of recurrent disease. She has been on tamoxifen for 2 full years now and per Dr. Virgie Dad last note at this visit we are to discuss switching to an aromatase inhibitor. We spent about 40 minutes in this visit and most of that time was regarding the pros and cons for both tamoxifen and anastrozole and she was given a handout comparing the two. As she is already moderately osteopenic, she understands that we will suggest she consider a bone strengthening supplement such as zometa or prolia in addition to the calcium and vitamin D supplements she is already on. I gave her a printout from [employer] explaining these further and listing specific studies regarding these drugs.   At this time Sydney Cervantes would like to continue  with tamoxifen, which is an understandable choice. She will reevaluate and we will discuss further when she returns in 6 months. She understands and agrees with this plan. She knows the goal of treatment in her case is cure. She has been encouraged to call with any issues that might arise before her next visit here.   Sydney Duster, NP  01/15/2015   

## 2015-01-17 ENCOUNTER — Encounter: Payer: Self-pay | Admitting: Nurse Practitioner

## 2015-01-17 DIAGNOSIS — M8580 Other specified disorders of bone density and structure, unspecified site: Secondary | ICD-10-CM | POA: Insufficient documentation

## 2015-02-12 ENCOUNTER — Encounter: Payer: Self-pay | Admitting: Oncology

## 2015-07-22 ENCOUNTER — Other Ambulatory Visit (HOSPITAL_BASED_OUTPATIENT_CLINIC_OR_DEPARTMENT_OTHER): Payer: Commercial Managed Care - PPO

## 2015-07-22 DIAGNOSIS — C773 Secondary and unspecified malignant neoplasm of axilla and upper limb lymph nodes: Secondary | ICD-10-CM

## 2015-07-22 DIAGNOSIS — C50512 Malignant neoplasm of lower-outer quadrant of left female breast: Secondary | ICD-10-CM | POA: Diagnosis not present

## 2015-07-22 LAB — CBC WITH DIFFERENTIAL/PLATELET
BASO%: 0.6 % (ref 0.0–2.0)
Basophils Absolute: 0 10*3/uL (ref 0.0–0.1)
EOS%: 5.7 % (ref 0.0–7.0)
Eosinophils Absolute: 0.3 10*3/uL (ref 0.0–0.5)
HCT: 41.8 % (ref 34.8–46.6)
HGB: 13.8 g/dL (ref 11.6–15.9)
LYMPH%: 28.9 % (ref 14.0–49.7)
MCH: 32.5 pg (ref 25.1–34.0)
MCHC: 33 g/dL (ref 31.5–36.0)
MCV: 98.5 fL (ref 79.5–101.0)
MONO#: 0.6 10*3/uL (ref 0.1–0.9)
MONO%: 10.8 % (ref 0.0–14.0)
NEUT%: 54 % (ref 38.4–76.8)
NEUTROS ABS: 3 10*3/uL (ref 1.5–6.5)
PLATELETS: 172 10*3/uL (ref 145–400)
RBC: 4.24 10*6/uL (ref 3.70–5.45)
RDW: 13.7 % (ref 11.2–14.5)
WBC: 5.5 10*3/uL (ref 3.9–10.3)
lymph#: 1.6 10*3/uL (ref 0.9–3.3)

## 2015-07-22 LAB — COMPREHENSIVE METABOLIC PANEL (CC13)
ALT: 35 U/L (ref 0–55)
ANION GAP: 9 meq/L (ref 3–11)
AST: 31 U/L (ref 5–34)
Albumin: 4 g/dL (ref 3.5–5.0)
Alkaline Phosphatase: 93 U/L (ref 40–150)
BUN: 14.9 mg/dL (ref 7.0–26.0)
CHLORIDE: 106 meq/L (ref 98–109)
CO2: 28 meq/L (ref 22–29)
CREATININE: 0.9 mg/dL (ref 0.6–1.1)
Calcium: 9.4 mg/dL (ref 8.4–10.4)
EGFR: 76 mL/min/{1.73_m2} — ABNORMAL LOW (ref >90)
GLUCOSE: 94 mg/dL (ref 70–140)
Potassium: 4.6 mEq/L (ref 3.5–5.1)
SODIUM: 144 meq/L (ref 136–145)
TOTAL PROTEIN: 6.4 g/dL (ref 6.4–8.3)
Total Bilirubin: 0.4 mg/dL (ref 0.20–1.20)

## 2015-07-29 ENCOUNTER — Ambulatory Visit (HOSPITAL_BASED_OUTPATIENT_CLINIC_OR_DEPARTMENT_OTHER): Payer: Commercial Managed Care - PPO | Admitting: Oncology

## 2015-07-29 ENCOUNTER — Telehealth: Payer: Self-pay | Admitting: Oncology

## 2015-07-29 VITALS — BP 115/80 | HR 93 | Temp 98.5°F | Resp 18 | Ht 68.0 in | Wt 150.6 lb

## 2015-07-29 DIAGNOSIS — C773 Secondary and unspecified malignant neoplasm of axilla and upper limb lymph nodes: Secondary | ICD-10-CM | POA: Diagnosis not present

## 2015-07-29 DIAGNOSIS — C50512 Malignant neoplasm of lower-outer quadrant of left female breast: Secondary | ICD-10-CM

## 2015-07-29 DIAGNOSIS — M8580 Other specified disorders of bone density and structure, unspecified site: Secondary | ICD-10-CM

## 2015-07-29 DIAGNOSIS — K760 Fatty (change of) liver, not elsewhere classified: Secondary | ICD-10-CM | POA: Diagnosis not present

## 2015-07-29 NOTE — Progress Notes (Signed)
ID: Sydney Cervantes   DOB: 15-Jan-1961  MR#: [id number]  WUJ#:[id number]  GYN:  [name], NP  SU:  Dr. Alphonsa Overall  OTHER MD: Crissie Reese, MD:  Lance Morin, MD  CHIEF COMPLAINT:  Hx of Left Breast Cancer  CURRENT TREATMENT: Tamoxifen   BREAST CANCER HISTORY: From Dr. Rada Hay prior summary note:  1. 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  ER/PR positive at 63/5% respectively, HER-2 negative, elevated Ki-67. Completed 4/6 planned neoadjuvant dose dense FEC with Neulasta support on day 2. 20% dose reduction on 5-FU, 50% dose reduction on epirubicin, 40% dose reduction on Cytoxan, after one-week delay due to anemia/thrombocytopenia/and region of probable localized thrombophlebitis versus mild chemotherapy extravasation on the left forearm, covered with Keflex 500 mg by mouth 3 times a day. Switched to single agent neoadjuvant Taxol, given 3 weeks on 1 week off, completed 4 cycles on 08/02/2012.   2. Status post left modified radical mastectomy on 08/25/2012 with final pathology showing no tumor in the primary tumor bed with 7 of 8 involved lymph nodes. The final pathology showed an ER positive tumor, HER-2 negative.  She completed radiation on 11/18/12 and started taking Tamoxifen on 11/21/2012.  It is also noted that she is BRCA negative.   Her subsequent history is as detailed below  INTERVAL HISTORY:  Sydney Cervantes returns today accompanied by her husband [name] for follow up of her left breast carcinoma.  At the last visit we discussed the possibility of switching to an aromatase inhibitor which might allow her to discontinue anti-estrogens after 5 years total. However she is very comfortable with tamoxifen. She has no hot flashes no significant vaginal  Discharge, and obtains it of free of charge. She is also concerned about her osteopenia getting worse on aromatase inhibitors.  Accordingly the plan is to stay on tamoxifen for total of 10 years.  REVIEW OF SYSTEMS: Sydney Cervantes through the [facility] program and really enjoyed it. She walks regularly. She is also coming to our yoga class. Sometimes she has cramps in her legs and feet. She feels a little forgetful. She still has a little bit of numbness on her toes. A detailed review of systems today was otherwise stable.  PAST MEDICAL HISTORY: Past Medical History  Diagnosis Date  . Hx of colonoscopy 2000  . Wears glasses   . Seasonal allergies     "when molds and things come out; pollens" (08/25/2012)  . Breast cancer     left  . Anemia     "chemo induced" (08/25/2012)  . Hx of radiation therapy 09/21/12 -11/17/12    R supraclav fossa, L chest wall/SCVL fossa/PAB mid axilla/IM nodes/scar boost  . Hepatic steatosis 12/27/2013  . GERD (gastroesophageal reflux disease)     occ; "side effect of chemo" (08/25/2012)  . Carcinoid tumor of colon, benign     partial colectomy ,1987    PAST SURGICAL HISTORY: Past Surgical History  Procedure Laterality Date  . 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 Portacath placement  02/11/2012    Procedure: INSERTION PORT-A-CATH;  Surgeon: Shann Medal, MD;  Location: [place];  Service: General;  Laterality: Right;  Subclavian  . Appendectomy  1987  . Cholecystectomy  2003  . Dilation and curettage of uterus  1992  . Mastectomy modified radical  08/25/2012    left  . Breast biopsy  01/2012  left  . Latissimus flap to breast Left 01/11/2014    Procedure: LEFT LATISSIMUS FLAP TO BREAST WITH SALINE IMPLANT FOR RECONSTRUCTION;  Surgeon: Crissie Reese, MD;  Location: [place];  Service: Plastics;  Laterality: Left;  . Breast reduction surgery Right 01/11/2014    Procedure: RIGHT BREAST REDUCTION ;  Surgeon: Crissie Reese, MD;  Location: [place];  Service: Plastics;  Laterality: Right;  . Colonoscopy N/A 05/11/2014    Procedure: COLONOSCOPY;  Surgeon: Shann Medal, MD;   Location: [facility];  Service: General;  Laterality: N/A;    FAMILY HISTORY Family History  Problem Relation Age of Onset  . Breast cancer Mother   . Breast cancer Paternal Aunt   . Cancer Maternal Grandmother     Hodgkins Disease    GYNECOLOGIC HISTORY:   G3P2.  Menarche at 54 years of age.  Followed by [name], NP.    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 Scott works in Engineer, technical sales for [employer]. Daughter Joellen Jersey currently 15 years old is a Environmental education officer in psychology and exercise physiology hoping to become an occupational therapist. Daughter Raquel Sarna studied Therapist, occupational at [employer]	[place] and hopes to be in [place] ; she is currently living at home with the patient. Daughter Burman Nieves is currently at [employer] education.    HEALTH MAINTENANCE:  (Updated January 2015) Social History  Substance Use Topics  . Smoking status: Never Smoker   . Smokeless tobacco: Never Used  . Alcohol Use: 0.0 oz/week    7-10 Glasses of wine per week     Comment: 06/26/2013 - 1-1.5 glasses of wine nightly     COLONOSCOPY: Approx 2000  PAP:  Not on file  BONE DENSITY:   January 2016  LIPIDS:  Not on file     Allergies  Allergen Reactions  . Adhesive [Tape] Itching and Rash    Rash and itching at steristrip and EKG lead pad sites  . Codeine     Headache    Current Outpatient Prescriptions  Medication Sig Dispense Refill  . Calcium Carbonate-Vitamin D (CALTRATE 600+D PO) Take 1 tablet by mouth daily.     Marland Kitchen ibuprofen (ADVIL,MOTRIN) 400 MG tablet Take 400 mg by mouth.    . Probiotic Product (PROBIOTIC DAILY PO) Take 2 capsules by mouth daily.    . tamoxifen (NOLVADEX) 20 MG tablet Take 1 tablet (20 mg total) by mouth daily. 90 tablet 2   No current facility-administered medications for this visit.     OBJECTIVE: Middle-aged white woman who appears well  Filed Vitals:   07/29/15 1256  BP: 115/80  Pulse: 93  Temp: 98.5 F (36.9 C)  Resp: 18     Body mass index is 22.9 kg/(m^2).    ECOG FS: 0 Filed Weights   07/29/15 1256  Weight: 150 lb 9.6 oz (68.312 kg)   Sclerae unicteric, pupils round and equal Oropharynx clear and moist-- no thrush or other lesions No cervical or supraclavicular adenopathy Lungs no rales or rhonchi Heart regular rate and rhythm Abd soft, nontender, positive bowel sounds MSK no focal spinal tenderness, no upper extremity lymphedema Neuro: nonfocal, well oriented, appropriate affect Breasts:  The right breast is unremarkable. The left breast is status post mastectomy with flap reconstruction. There is no evidence of local recurrence. Left axilla is benign.    LAB RESULTS:  Lab Results  Component Value Date   WBC 5.5 07/22/2015   NEUTROABS 3.0 07/22/2015   HGB 13.8 07/22/2015   HCT 41.8 07/22/2015   MCV 98.5 07/22/2015   PLT 172 07/22/2015      Chemistry      Component Value Date/Time   NA 144 07/22/2015 1456   NA 145 01/02/2014 1337   K 4.6 07/22/2015 1456   K 4.5 01/02/2014 1337   CL 105 01/02/2014 1337   CL 108* 03/20/2013 1438   CO2 28 07/22/2015 1456   CO2 26 01/02/2014 1337   BUN 14.9 07/22/2015 1456   BUN 14 01/02/2014 1337   CREATININE 0.9 07/22/2015 1456   CREATININE 0.73 01/02/2014 1337      Component Value Date/Time   CALCIUM 9.4 07/22/2015 1456   CALCIUM 9.4 01/02/2014 1337   ALKPHOS 93 07/22/2015 1456   ALKPHOS 62 06/21/2012 1259   AST 31 07/22/2015 1456   AST 42* 06/21/2012 1259   ALT 35 07/22/2015 1456   ALT 45* 06/21/2012 1259   BILITOT 0.40 07/22/2015 1456   BILITOT 0.5 06/21/2012 1259       STUDIES:  Most recent mammogram  06-Aug-2015 at [facility] showed some likely dystrophic calcifications, with repeat in 6 months pending (I have not yet received a hard copy).   ASSESSMENT:  54 y.o. BRCA negative  [place] woman  (1) s/p left breast and left axillary lymph node biopsies 01/18/2012 for a clinical T4 N3, stage IIIC invasive lobular carcinoma, grade 3, estrogen receptor 62% positive, progesterone receptor 5% positive, with an MIB-1 of 33% and no HER-2 amplification.  (2) Began chemotherapy in March 2013, and received 4/6 planned neoadjuvant cycles of dose dense cyclophosphamide, epirubicin and fluorouracil followed by weekly paclitaxel x 12 completed Taxol 08/02/2012.   (3) left modified radical mastectomy 08/25/2012 showed no residual tumor in the breast, but 7/8 sampled lymph nodes were positive, 6 with macro metastases. Repeat HER-2 was again negative.  (4) She completed radiation on 11/18/12   (5) started tamoxifen on 11/21/2012  (a) opted against switching to aromatase inhibitors  (6) on 01/11/2014 underwent left latissimus dorsi myocutaneous flap reconstruction of the left breast, with saline implant, and a right breast mammographic C4 symmetry  (7) history of a cecal carcinoid resected 1987, with an unremarkable colonoscopy 05/14/2014  (8) hepatic steatosis, with occasional AST elevation: no trend noted  (9) osteopenia -   (a) T-score   -1.9 on dexa  12/21/14 at [place]: Dimonique is  Now 3 years out from her definitive surgery with no evidence of disease recurrence. This is very favorable.  She would prefer to stay on tamoxifen, which is a very reasonable choice. It does imply that she will need to be on tamoxifen for total of 10 years. She is very agreeable to this.   As far as her osteopenia is concerned she will continue on vitamin D so and calcium supplementation and weightbearing exercise. Tamoxifen of course will be helpful in that regard.   I don't have her mammogram for review reassured her that if [facility] is suggesting excessive six-month follow-up they do not feel the concern rises to the level of repeat biopsy. Most likely she is having some fat necrosis. At any  rate she will have a repeat mammogram in 6 months and I'm going to see her shortly after that.  As far as vaginal dryness and atrophy is concerned she understands that while on tamoxifen it is okay as far as  I am concerned to use vaginal creams or Estring. She will discuss this with [name] her gynecologist at her discretion   Sydney Cervantes knows to call for any problems that may develop before her next visit here.  Chauncey Cruel, MD    07/29/2015

## 2015-07-29 NOTE — Telephone Encounter (Signed)
Gave avs & calendar for March 2017. °

## 2015-08-05 ENCOUNTER — Other Ambulatory Visit: Payer: Self-pay | Admitting: Nurse Practitioner

## 2016-01-31 ENCOUNTER — Other Ambulatory Visit: Payer: Self-pay | Admitting: Oncology

## 2016-01-31 ENCOUNTER — Telehealth: Payer: Self-pay | Admitting: Oncology

## 2016-01-31 NOTE — Telephone Encounter (Signed)
Chart reviewed.

## 2016-01-31 NOTE — Telephone Encounter (Signed)
Patient called to r/s 3/2 lab and 3/9 f/u due to she will be out of town. Due to next available with [facility] too far out patient would like to see HB. Gave patient new appointments for 3/20 and 3/27.

## 2016-02-06 ENCOUNTER — Other Ambulatory Visit: Payer: Commercial Managed Care - PPO

## 2016-02-13 ENCOUNTER — Ambulatory Visit: Payer: Commercial Managed Care - PPO | Admitting: Oncology

## 2016-02-24 ENCOUNTER — Other Ambulatory Visit (HOSPITAL_BASED_OUTPATIENT_CLINIC_OR_DEPARTMENT_OTHER): Payer: Commercial Managed Care - PPO

## 2016-02-24 DIAGNOSIS — C50512 Malignant neoplasm of lower-outer quadrant of left female breast: Secondary | ICD-10-CM

## 2016-02-24 DIAGNOSIS — M8580 Other specified disorders of bone density and structure, unspecified site: Secondary | ICD-10-CM

## 2016-02-24 LAB — CBC WITH DIFFERENTIAL/PLATELET
BASO%: 0.2 % (ref 0.0–2.0)
Basophils Absolute: 0 10*3/uL (ref 0.0–0.1)
EOS%: 5.1 % (ref 0.0–7.0)
Eosinophils Absolute: 0.2 10*3/uL (ref 0.0–0.5)
HEMATOCRIT: 42.4 % (ref 34.8–46.6)
HGB: 14.1 g/dL (ref 11.6–15.9)
LYMPH#: 1.4 10*3/uL (ref 0.9–3.3)
LYMPH%: 29.2 % (ref 14.0–49.7)
MCH: 32.7 pg (ref 25.1–34.0)
MCHC: 33.3 g/dL (ref 31.5–36.0)
MCV: 98.4 fL (ref 79.5–101.0)
MONO#: 0.4 10*3/uL (ref 0.1–0.9)
MONO%: 9 % (ref 0.0–14.0)
NEUT#: 2.7 10*3/uL (ref 1.5–6.5)
NEUT%: 56.5 % (ref 38.4–76.8)
Platelets: 144 10*3/uL — ABNORMAL LOW (ref 145–400)
RBC: 4.31 10*6/uL (ref 3.70–5.45)
RDW: 13.1 % (ref 11.2–14.5)
WBC: 4.7 10*3/uL (ref 3.9–10.3)

## 2016-02-24 LAB — COMPREHENSIVE METABOLIC PANEL
ALK PHOS: 89 U/L (ref 40–150)
ALT: 25 U/L (ref 0–55)
AST: 29 U/L (ref 5–34)
Albumin: 3.7 g/dL (ref 3.5–5.0)
Anion Gap: 7 mEq/L (ref 3–11)
BUN: 20.3 mg/dL (ref 7.0–26.0)
CALCIUM: 9.1 mg/dL (ref 8.4–10.4)
CHLORIDE: 106 meq/L (ref 98–109)
CO2: 30 mEq/L — ABNORMAL HIGH (ref 22–29)
Creatinine: 0.9 mg/dL (ref 0.6–1.1)
EGFR: 75 mL/min/{1.73_m2} — ABNORMAL LOW (ref >90)
Glucose: 113 mg/dl (ref 70–140)
Potassium: 4.9 mEq/L (ref 3.5–5.1)
Sodium: 143 mEq/L (ref 136–145)
Total Bilirubin: 0.37 mg/dL (ref 0.20–1.20)
Total Protein: 6.4 g/dL (ref 6.4–8.3)

## 2016-03-02 ENCOUNTER — Ambulatory Visit (HOSPITAL_BASED_OUTPATIENT_CLINIC_OR_DEPARTMENT_OTHER): Payer: Commercial Managed Care - PPO | Admitting: Nurse Practitioner

## 2016-03-02 ENCOUNTER — Telehealth: Payer: Self-pay | Admitting: Oncology

## 2016-03-02 ENCOUNTER — Encounter: Payer: Self-pay | Admitting: Nurse Practitioner

## 2016-03-02 VITALS — BP 124/78 | HR 88 | Temp 98.0°F | Resp 18 | Wt 149.6 lb

## 2016-03-02 DIAGNOSIS — C50512 Malignant neoplasm of lower-outer quadrant of left female breast: Secondary | ICD-10-CM

## 2016-03-02 DIAGNOSIS — Z859 Personal history of malignant neoplasm, unspecified: Secondary | ICD-10-CM

## 2016-03-02 DIAGNOSIS — G629 Polyneuropathy, unspecified: Secondary | ICD-10-CM

## 2016-03-02 DIAGNOSIS — C773 Secondary and unspecified malignant neoplasm of axilla and upper limb lymph nodes: Secondary | ICD-10-CM

## 2016-03-02 DIAGNOSIS — N899 Noninflammatory disorder of vagina, unspecified: Secondary | ICD-10-CM

## 2016-03-02 DIAGNOSIS — M8580 Other specified disorders of bone density and structure, unspecified site: Secondary | ICD-10-CM | POA: Diagnosis not present

## 2016-03-02 DIAGNOSIS — N898 Other specified noninflammatory disorders of vagina: Secondary | ICD-10-CM

## 2016-03-02 NOTE — Progress Notes (Signed)
ID: Sydney Cervantes   DOB: [birth year]  MR#: [id number]  BTD#:[id number]  GYN:  Sydney Coder, NP  SU:  Dr. Alphonsa Overall  OTHER MD: Crissie Reese, MD:  Lance Morin, MD  CHIEF COMPLAINT:  Hx of Left Breast Cancer  CURRENT TREATMENT: Tamoxifen  BREAST CANCER HISTORY: From Dr. Rada Hay prior summary note:  1. 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  ER/PR positive at 63/5% respectively, HER-2 negative, elevated Ki-67. Completed 4/6 planned neoadjuvant dose dense FEC with Neulasta support on day 2. 20% dose reduction on 5-FU, 50% dose reduction on epirubicin, 40% dose reduction on Cytoxan, after one-week delay due to anemia/thrombocytopenia/and region of probable localized thrombophlebitis versus mild chemotherapy extravasation on the left forearm, covered with Keflex 500 mg by mouth 3 times a day. Switched to single agent neoadjuvant Taxol, given 3 weeks on 1 week off, completed 4 cycles on 08/02/2012.   2. Status post left modified radical mastectomy on 08/25/2012 with final pathology showing no tumor in the primary tumor bed with 7 of 8 involved lymph nodes. The final pathology showed an ER positive tumor, HER-2 negative.  She completed radiation on 11/18/12 and started taking Tamoxifen on 11/21/2012.  It is also noted that she is BRCA negative.   Her subsequent history is as detailed below  INTERVAL HISTORY:  Sydney Cervantes returns today for follow up of her left breast carcinoma, accompanied by her husband, Event organiser.  She has been on tamoxifen since December 2013 and tolerates this drug well. She has few hot flashes. Her main complaint is vaginal dryness. The interval history is generally unremarkable.  REVIEW OF SYSTEMS: Sydney Cervantes is no longer at the gym, but walks several miles on [employer] trail net work weekly. She also participates in yoga weekly. 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 She is in no pain today. A detailed review of systems is otherwise entirely negative.  PAST MEDICAL HISTORY: Past Medical History  Diagnosis Date  . Hx of colonoscopy 2000  . Wears glasses   . Seasonal allergies     "when molds and things come out; pollens" (08/25/2012)  . Breast cancer     left  . Anemia     "chemo induced" (08/25/2012)  . Hx of radiation therapy 09/21/12 -11/17/12    R supraclav fossa, L chest wall/SCVL fossa/PAB mid axilla/IM nodes/scar boost  . Hepatic steatosis 12/27/2013  . GERD (gastroesophageal reflux disease)     occ; "side effect of chemo" (08/25/2012)  . Carcinoid tumor of colon, benign     partial colectomy ,1987    PAST SURGICAL HISTORY: Past Surgical History  Procedure Laterality Date  . 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 Portacath placement  02/11/2012    Procedure: INSERTION PORT-A-CATH;  Surgeon: Shann Medal, MD;  Location: [place];  Service: General;  Laterality: Right;  Subclavian  . Appendectomy  1987  . Cholecystectomy  2003  . Dilation and curettage of uterus  1992  . Mastectomy modified radical  08/25/2012    left  . Breast biopsy  01/2012    left  . Latissimus flap to breast Left 01/11/2014    Procedure: LEFT LATISSIMUS FLAP TO BREAST WITH SALINE IMPLANT FOR RECONSTRUCTION;  Surgeon: Crissie Reese, MD;  Location: [place];  Service: Plastics;  Laterality: Left;  . Breast reduction surgery  Right 01/11/2014    Procedure: RIGHT BREAST REDUCTION ;  Surgeon: Crissie Reese, MD;  Location: [place];  Service: Plastics;  Laterality: Right;  . Colonoscopy N/A 05/11/2014    Procedure: COLONOSCOPY;  Surgeon: Shann Medal, MD;  Location: [facility];  Service: General;  Laterality: N/A;    FAMILY HISTORY Family History  Problem Relation Age of Onset  . Breast cancer Mother   . Breast cancer Paternal Aunt   . Cancer Maternal Grandmother     Hodgkins Disease    GYNECOLOGIC HISTORY:    G3P2.  Menarche at 55 years of age.  Followed by Sydney Coder, NP.    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 Sydney Cervantes works in Engineer, technical sales for [employer]. Daughter Sydney Cervantes currently 50 years old is a Environmental education officer in psychology and exercise physiology hoping to become an occupational therapist. Daughter Sydney Cervantes studied Therapist, occupational at [employer]	[place] and hopes to be in [place] ; she is currently living at home with the patient. Daughter Sydney Cervantes is currently at [employer] education.    HEALTH MAINTENANCE:  (Updated January 2015) Social History  Substance Use Topics  . Smoking status: Never Smoker   . Smokeless tobacco: Never Used  . Alcohol Use: 0.0 oz/week    7-10 Glasses of wine per week     Comment: 06/26/2013 - 1-1.5 glasses of wine nightly     COLONOSCOPY: Approx 2000  PAP:  Not on file  BONE DENSITY:   January 2016  LIPIDS:  Not on file     Allergies  Allergen Reactions  . Adhesive [Tape] Itching and Rash    Rash and itching at steristrip and EKG lead pad sites  . Codeine     Headache    Current Outpatient Prescriptions  Medication Sig Dispense Refill  . Calcium Carbonate-Vitamin D (CALTRATE 600+D PO) Take 1 tablet by mouth daily.     . Probiotic Product (PROBIOTIC DAILY PO) Take 2 capsules by mouth daily.    . tamoxifen (NOLVADEX) 20 MG tablet TAKE 1 TABLET DAILY (CONTINUE THROUGH 11/2015) 90 tablet 3   No current facility-administered medications for this visit.    OBJECTIVE: Middle-aged white woman who appears well  Filed Vitals:   03/02/16 1319  BP: 124/78  Pulse: 88  Temp: 98 F (36.7 C)  Resp: 18     Body mass index is 22.75 kg/(m^2).    ECOG FS: 0 Filed Weights   03/02/16 1319  Weight: 149 lb 9.6 oz (67.858 kg)   Skin: warm, dry  HEENT: sclerae anicteric, conjunctivae pink,  oropharynx clear. No thrush or mucositis.  Lymph Nodes: No cervical or supraclavicular lymphadenopathy  Lungs: clear to auscultation bilaterally, no rales, wheezes, or rhonci  Heart: regular rate and rhythm  Abdomen: round, soft, non tender, positive bowel sounds  Musculoskeletal: No focal spinal tenderness, no peripheral edema  Neuro: non focal, well oriented, positive affect  Breasts: left breast status post mastectomy and reconstruction. No evidence of recurrent disease. Left axilla benign. Right breast status post reduction, otherwise unremarkable.  LAB RESULTS: Lab Results  Component Value Date   WBC 4.7 02/24/2016   NEUTROABS 2.7 02/24/2016   HGB 14.1 02/24/2016   HCT 42.4 02/24/2016   MCV 98.4 02/24/2016   PLT 144* 02/24/2016      Chemistry      Component Value Date/Time  NA 143 02/24/2016 1341   NA 145 01/02/2014 1337   K 4.9 02/24/2016 1341   K 4.5 01/02/2014 1337   CL 105 01/02/2014 1337   CL 108* 03/20/2013 1438   CO2 30* 02/24/2016 1341   CO2 26 01/02/2014 1337   BUN 20.3 02/24/2016 1341   BUN 14 01/02/2014 1337   CREATININE 0.9 02/24/2016 1341   CREATININE 0.73 01/02/2014 1337      Component Value Date/Time   CALCIUM 9.1 02/24/2016 1341   CALCIUM 9.4 01/02/2014 1337   ALKPHOS 89 02/24/2016 1341   ALKPHOS 62 06/21/2012 1259   AST 29 02/24/2016 1341   AST 42* 06/21/2012 1259   ALT 25 02/24/2016 1341   ALT 45* 06/21/2012 1259   BILITOT 0.37 02/24/2016 1341   BILITOT 0.5 06/21/2012 1259       STUDIES:  Most recent mammogram  August 18, 2015 at [facility] showed some likely dystrophic calcifications, with repeat in 6 months pending (I have not yet received a hard copy).   ASSESSMENT:  55 y.o. BRCA negative  woman  (1) s/p left breast and left axillary lymph node biopsies 01/18/2012 for a clinical T4 N3, stage IIIC invasive lobular carcinoma, grade 3, estrogen receptor 62% positive, progesterone receptor 5% positive, with an MIB-1 of 33% and no HER-2  amplification.  (2) Began chemotherapy in March 2013, and received 4/6 planned neoadjuvant cycles of dose dense cyclophosphamide, epirubicin and fluorouracil followed by weekly paclitaxel x 12 completed Taxol 08/02/2012.   (3) left modified radical mastectomy 08/25/2012 showed no residual tumor in the breast, but 7/8 sampled lymph nodes were positive, 6 with macro metastases. Repeat HER-2 was again negative.  (4) She completed radiation on 11/18/12   (5) started tamoxifen on 11/21/2012  (a) opted against switching to aromatase inhibitors  (6) on 01/11/2014 underwent left latissimus dorsi myocutaneous flap reconstruction of the left breast, with saline implant, and a right breast mammographic C4 symmetry  (7) history of a cecal carcinoid resected 1987, with an unremarkable colonoscopy 05/14/2014  (8) hepatic steatosis, with occasional AST elevation: no trend noted  (9) osteopenia -   (a) T-score   -1.9 on dexa  12/21/14 at [place]: Nasira is doing well as far as her breast cancer is concerned. She is now 4 years out from her definitive surgery with no evidence of recurrent disease. In [place], her mammogram again showed a cluster of calcifications that will require follow up in 6 months. The impression was labeled "probably benign" so she is rightfully not concerned.   She is tolerating the tamoxifen well and will continue this drug for 10 years of antiestrogen therapy. She will try coconut oil for her vaginal dryness.  Karista will return for follow up after her repeat mammogram in 08-18-2023. If she is doing well at this point she may move to yearly visits. She understands and agrees with this plan. She knows the goal of treatment in her case is cure. She has been encouraged to call with any issues that might arise before her next visit here.   Laurie Panda, NP    03/02/2016

## 2016-03-02 NOTE — Telephone Encounter (Signed)
Gave patient avs report and appointments for September.  °

## 2016-08-28 ENCOUNTER — Other Ambulatory Visit: Payer: Self-pay | Admitting: *Deleted

## 2016-08-28 DIAGNOSIS — C50512 Malignant neoplasm of lower-outer quadrant of left female breast: Secondary | ICD-10-CM

## 2016-08-30 NOTE — Progress Notes (Signed)
We should probably myalgias that were having ID: Sydney Cervantes   DOB: Mar 21, 1961  MR#: [id number]  ATF#:[id number]  GYN:  Sydney Coder, NP  SU:  Dr. Alphonsa Overall  OTHER MD: Crissie Reese, MD:  Lance Morin, MD  CHIEF COMPLAINT:  Hx of Left Breast Cancer  CURRENT TREATMENT: Tamoxifen  BREAST CANCER HISTORY: From Dr. Rada Hay prior summary note:  1. 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  ER/PR positive at 63/5% respectively, HER-2 negative, elevated Ki-67. Completed 4/6 planned neoadjuvant dose dense FEC with Neulasta support on day 2. 20% dose reduction on 5-FU, 50% dose reduction on epirubicin, 40% dose reduction on Cytoxan, after one-week delay due to anemia/thrombocytopenia/and region of probable localized thrombophlebitis versus mild chemotherapy extravasation on the left forearm, covered with Keflex 500 mg by mouth 3 times a day. Switched to single agent neoadjuvant Taxol, given 3 weeks on 1 week off, completed 4 cycles on 08/02/2012.   2. Status post left modified radical mastectomy on 08/25/2012 with final pathology showing no tumor in the primary tumor bed with 7 of 8 involved lymph nodes. The final pathology showed an ER positive tumor, HER-2 negative.  She completed radiation on 11/18/12 and started taking Tamoxifen on 11/21/2012.  It is also noted that she is BRCA negative.   Her subsequent history is as detailed below  INTERVAL HISTORY:  Sydney Cervantes returns today for follow up of her estrogen receptor positive breast carcinoma, accompanied by her husband, Sydney Cervantes.  She continues on tamoxifen, with excellent tolerance. Hot flashes and vaginal wetness are not major concerns.  REVIEW OF SYSTEMS: Sydney Cervantes fell a year ago during a vacation trip and injured her right hip. Ever since that time it has heard on and off, in no particular pattern. It hurts when she lies on that side at  night. He doesn't matter she walks or not in terms of whether it gets better or worse. Again this is not continuous. Aside from that she has a little bit of a dry cough which she says is not a new problem. She bruises easily. She feels a little forgetful. She does have vaginal dryness issues. A detailed review of systems today was otherwise stable.  PAST MEDICAL HISTORY: Past Medical History:  Diagnosis Date  . Anemia    "chemo induced" (08/25/2012)  . Breast cancer ([place])    left  . Carcinoid tumor of colon, benign    partial colectomy ,1987  . GERD (gastroesophageal reflux disease)    occ; "side effect of chemo" (08/25/2012)  . Hepatic steatosis 12/27/2013  . Hx of colonoscopy 2000  . Hx of radiation therapy 09/21/12 -11/17/12   R supraclav fossa, L chest wall/SCVL fossa/PAB mid axilla/IM nodes/scar boost  . Seasonal allergies    "when molds and things come out; pollens" (08/25/2012)  . Wears glasses     PAST SURGICAL HISTORY: Past Surgical History:  Procedure Laterality Date  . APPENDECTOMY  1987  . BOWEL RESECTION  1987   Carcinoid Tumor of Appendix  . BREAST BIOPSY  01/2012   left  . BREAST REDUCTION SURGERY Right 01/11/2014   Procedure: RIGHT BREAST REDUCTION ;  Surgeon: Crissie Reese, MD;  Location: [place];  Service: Plastics;  Laterality: Right;  . CHOLECYSTECTOMY  2003  . COLONOSCOPY N/A 05/11/2014   Procedure: COLONOSCOPY;  Surgeon: Shann Medal, MD;  Location: [facility];  Service: General;  Laterality: N/A;  . DILATION  AND CURETTAGE OF UTERUS  1992  . LATISSIMUS FLAP TO BREAST Left 01/11/2014   Procedure: LEFT LATISSIMUS FLAP TO BREAST WITH SALINE IMPLANT FOR RECONSTRUCTION;  Surgeon: Crissie Reese, MD;  Location: [place];  Service: Plastics;  Laterality: Left;  Marland Kitchen MASTECTOMY MODIFIED RADICAL  08/25/2012   left  . PORTACATH PLACEMENT  02/11/2012   Procedure: INSERTION PORT-A-CATH;  Surgeon: Shann Medal, MD;  Location: [place];  Service: General;  Laterality: Right;  Subclavian     FAMILY HISTORY Family History  Problem Relation Age of Onset  . Breast cancer Mother   . Breast cancer Paternal Aunt   . Cancer Maternal Grandmother     Hodgkins Disease    GYNECOLOGIC HISTORY:   G3P2.  Menarche at 55 years of age.  Followed by Sydney Coder, NP.    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 Sydney Cervantes works in Engineer, technical sales for [employer]. Daughter Sydney Cervantes currently 18 years old is a Environmental education officer in psychology and exercise physiology hoping to become an occupational therapist. Daughter Sydney Cervantes studied Therapist, occupational at [employer]	[place] and hopes to be in [place] ; she is currently living at home with the patient. Daughter Sydney Cervantes is currently at [employer] education.    HEALTH MAINTENANCE:  (Updated January 2015) Social History  Substance Use Topics  . Smoking status: Never Smoker  . Smokeless tobacco: Never Used  . Alcohol use 0.0 oz/week    7 - 10 Glasses of wine per week     Comment: 06/26/2013 - 1-1.5 glasses of wine nightly     COLONOSCOPY: Approx 2000  PAP:  Not on file  BONE DENSITY:   January 2016  LIPIDS:  Not on file     Allergies  Allergen Reactions  . Adhesive [Tape] Itching and Rash    Rash and itching at steristrip and EKG lead pad sites  . Codeine     Headache    Current Outpatient Prescriptions  Medication Sig Dispense Refill  . Calcium Carbonate-Vitamin D (CALTRATE 600+D PO) Take 1 tablet by mouth daily.     . Probiotic Product (PROBIOTIC DAILY PO) Take 2 capsules by mouth daily.    . tamoxifen (NOLVADEX) 20 MG tablet Take 1 tablet (20 mg total) by mouth daily. 90 tablet 4   No current facility-administered medications for this visit.     Directives: Middle-aged white woman who appears well nResp: 18  Temp: 99.4 F (37.4 C)     Body mass index is 22.41 kg/m.     ECOG FS: 0 Filed Weights   08/31/16 1410  Weight: 147 lb 6.4 oz (66.9 kg)   Sclerae unicteric, pupils round and equal Oropharynx clear and moist-- no thrush or other lesions No cervical or supraclavicular adenopathy Lungs no rales or rhonchi Heart regular rate and rhythm Abd soft, nontender, positive bowel sounds MSK no focal spinal tenderness, no upper extremity lymphedema Neuro: nonfocal, well oriented, appropriate affect Breasts: The right breast is status post reduction mammoplasty. There is no finding of concern. The left breast is status post mastectomy and TRAM reconstruction. There is no evidence of local recurrence. The left axilla is benign.    LAB RESULTS: Lab Results  Component Value Date   WBC 5.2 08/31/2016   NEUTROABS 3.3 08/31/2016   HGB 14.6 08/31/2016   HCT 45.0 08/31/2016   MCV 98.3  08/31/2016   PLT 154 08/31/2016      Chemistry      Component Value Date/Time   NA 143 02/24/2016 1341   K 4.9 02/24/2016 1341   CL 105 01/02/2014 1337   CL 108 (H) 03/20/2013 1438   CO2 30 (H) 02/24/2016 1341   BUN 20.3 02/24/2016 1341   CREATININE 0.9 02/24/2016 1341      Component Value Date/Time   CALCIUM 9.1 02/24/2016 1341   ALKPHOS 89 02/24/2016 1341   AST 29 02/24/2016 1341   ALT 25 02/24/2016 1341   BILITOT 0.37 02/24/2016 1341       STUDIES:  Six-month right Mammography at [facility] 08/13/2016 showed the breast density to be category B. In the area of calcifications in the right breast was felt to be benign. Routine mammography was recommended in one year    ASSESSMENT:  55 y.o. BRCA negative [place] woman  (1) s/p left breast lower outer quadrant and left axillary lymph node biopsies 01/18/2012 for a clinical T4 N3, stage IIIC invasive lobular carcinoma, grade 3, estrogen receptor 62% positive, progesterone receptor 5% positive, with an MIB-1 of 33% and no HER-2 amplification.  (2) Began chemotherapy in March 2013, and received 4/6 planned neoadjuvant  cycles of dose dense cyclophosphamide, epirubicin and fluorouracil followed by weekly paclitaxel x 12 completed Taxol 08/02/2012.   (3) left modified radical mastectomy 08/25/2012 showed no residual tumor in the breast, but 7/8 sampled lymph nodes were positive, 6 with macro metastases. Repeat HER-2 was again negative.  (4) She completed radiation on 11/18/12   (5) started tamoxifen on 11/21/2012  (a) opted against switching to aromatase inhibitors  (6) on 01/11/2014 underwent left latissimus dorsi myocutaneous flap reconstruction of the left breast, with saline implant, and a right breast mammographic C4 symmetry  (7) history of a cecal carcinoid resected 1987, with an unremarkable colonoscopy 05/14/2014  (8) hepatic steatosis, with occasional AST elevation: no trend noted  (9) osteopenia -   (a) T-score   -1.9 on dexa  12/21/14 at [place]: Sydney Cervantes is now 4 years out from definitive surgery for her breast cancer with no evidence of disease recurrence. His is very favorable.  She is tolerating the tamoxifen well. She really does not seem to be having any side effects that I can elicit from that drug. She is interested in taking it for 10 years, which is reasonable.  We discussed vaginal dryness problems. She has benefited from coconut oil. We discussed the "intimacy and pelvic health" program and she will give the initial introductory less than a try. If she likes it she will let me know and I will put in a full referral for her.  I reassured her that the right hip pain is very unlikely to be cancer. It could be bursitis or she could have some minor trauma from her fall last year. At any rate we are going to obtain a plain film today just to serve as a baseline. She will call us for results.    Otherwise she will see me again in a year. She knows to call for any problems that may develop before the next visit here.  Chauncey Cruel, MD    08/31/2016

## 2016-08-31 ENCOUNTER — Other Ambulatory Visit (HOSPITAL_BASED_OUTPATIENT_CLINIC_OR_DEPARTMENT_OTHER): Payer: Commercial Managed Care - PPO

## 2016-08-31 ENCOUNTER — Ambulatory Visit (HOSPITAL_COMMUNITY)
Admission: RE | Admit: 2016-08-31 | Discharge: 2016-08-31 | Disposition: A | Discharge status: Home / Self Care | Payer: Commercial Managed Care - PPO | Source: Ambulatory Visit | Attending: Oncology | Admitting: Oncology

## 2016-08-31 ENCOUNTER — Ambulatory Visit (HOSPITAL_BASED_OUTPATIENT_CLINIC_OR_DEPARTMENT_OTHER): Payer: Commercial Managed Care - PPO | Admitting: Oncology

## 2016-08-31 VITALS — BP 124/89 | HR 95 | Temp 99.4°F | Resp 18 | Ht 68.0 in | Wt 147.4 lb

## 2016-08-31 DIAGNOSIS — C773 Secondary and unspecified malignant neoplasm of axilla and upper limb lymph nodes: Secondary | ICD-10-CM

## 2016-08-31 DIAGNOSIS — N899 Noninflammatory disorder of vagina, unspecified: Secondary | ICD-10-CM

## 2016-08-31 DIAGNOSIS — C50512 Malignant neoplasm of lower-outer quadrant of left female breast: Secondary | ICD-10-CM

## 2016-08-31 DIAGNOSIS — K760 Fatty (change of) liver, not elsewhere classified: Secondary | ICD-10-CM

## 2016-08-31 DIAGNOSIS — M25551 Pain in right hip: Secondary | ICD-10-CM | POA: Insufficient documentation

## 2016-08-31 DIAGNOSIS — M8580 Other specified disorders of bone density and structure, unspecified site: Secondary | ICD-10-CM | POA: Diagnosis not present

## 2016-08-31 LAB — CBC WITH DIFFERENTIAL/PLATELET
BASO%: 0.5 % (ref 0.0–2.0)
Basophils Absolute: 0 10*3/uL (ref 0.0–0.1)
EOS ABS: 0.2 10*3/uL (ref 0.0–0.5)
EOS%: 3.8 % (ref 0.0–7.0)
HEMATOCRIT: 45 % (ref 34.8–46.6)
HGB: 14.6 g/dL (ref 11.6–15.9)
LYMPH#: 1.2 10*3/uL (ref 0.9–3.3)
LYMPH%: 24.1 % (ref 14.0–49.7)
MCH: 31.9 pg (ref 25.1–34.0)
MCHC: 32.4 g/dL (ref 31.5–36.0)
MCV: 98.3 fL (ref 79.5–101.0)
MONO#: 0.4 10*3/uL (ref 0.1–0.9)
MONO%: 8.6 % (ref 0.0–14.0)
NEUT%: 63 % (ref 38.4–76.8)
NEUTROS ABS: 3.3 10*3/uL (ref 1.5–6.5)
PLATELETS: 154 10*3/uL (ref 145–400)
RBC: 4.58 10*6/uL (ref 3.70–5.45)
RDW: 13.6 % (ref 11.2–14.5)
WBC: 5.2 10*3/uL (ref 3.9–10.3)

## 2016-08-31 LAB — COMPREHENSIVE METABOLIC PANEL
ALT: 37 U/L (ref 0–55)
ANION GAP: 11 meq/L (ref 3–11)
AST: 40 U/L — ABNORMAL HIGH (ref 5–34)
Albumin: 4.2 g/dL (ref 3.5–5.0)
Alkaline Phosphatase: 98 U/L (ref 40–150)
BILIRUBIN TOTAL: 0.47 mg/dL (ref 0.20–1.20)
BUN: 13.8 mg/dL (ref 7.0–26.0)
CALCIUM: 9.7 mg/dL (ref 8.4–10.4)
CO2: 26 mEq/L (ref 22–29)
CREATININE: 0.9 mg/dL (ref 0.6–1.1)
Chloride: 107 mEq/L (ref 98–109)
EGFR: 72 mL/min/{1.73_m2} — ABNORMAL LOW (ref >90)
Glucose: 98 mg/dl (ref 70–140)
Potassium: 4.1 mEq/L (ref 3.5–5.1)
Sodium: 143 mEq/L (ref 136–145)
TOTAL PROTEIN: 7.5 g/dL (ref 6.4–8.3)

## 2016-08-31 MED ORDER — TAMOXIFEN CITRATE 20 MG PO TABS: 20.0000 mg | ORAL_TABLET | Freq: Every day | ORAL | 4 refills | Status: DC

## 2016-09-02 ENCOUNTER — Telehealth: Payer: Self-pay | Admitting: *Deleted

## 2016-09-02 NOTE — Telephone Encounter (Signed)
"  I was seen Monday.  Lab and xray results are not released to MyChart is why I'm calling."  Per MyChart results all are due automatic release on 09-04-2016.  No further questions.

## 2016-12-12 ENCOUNTER — Other Ambulatory Visit: Payer: Self-pay | Admitting: Nurse Practitioner

## 2017-06-20 ENCOUNTER — Telehealth: Payer: Self-pay

## 2017-06-20 NOTE — Telephone Encounter (Signed)
Spoke with patient and she is aware of her new appt 

## 2017-09-01 ENCOUNTER — Other Ambulatory Visit: Payer: Commercial Managed Care - PPO

## 2017-09-01 ENCOUNTER — Ambulatory Visit: Payer: Commercial Managed Care - PPO | Admitting: Oncology

## 2017-09-27 NOTE — Progress Notes (Signed)
Sydney Cervantes   DOB: 1961-01-07  MR#: [id number]  BZJ#:[id number]  GYN:  [name], NP  SU:  Dr. Alphonsa Overall  OTHER MD: Crissie Reese, MD:  Lance Morin, MD  CHIEF COMPLAINT:  Hx of Left Breast Cancer  CURRENT TREATMENT: Tamoxifen  BREAST CANCER HISTORY: From Dr. Rada Hay prior summary note:  1. 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  ER/PR positive at 63/5% respectively, HER-2 negative, elevated Ki-67. Completed 4/6 planned neoadjuvant dose dense FEC with Neulasta support on day 2. 20% dose reduction on 5-FU, 50% dose reduction on epirubicin, 40% dose reduction on Cytoxan, after one-week delay due to anemia/thrombocytopenia/and region of probable localized thrombophlebitis versus mild chemotherapy extravasation on the left forearm, covered with Keflex 500 mg by mouth 3 times a day. Switched to single agent neoadjuvant Taxol, given 3 weeks on 1 week off, completed 4 cycles on 08/02/2012.   2. Status post left modified radical mastectomy on 08/25/2012 with final pathology showing no tumor in the primary tumor bed with 7 of 8 involved lymph nodes. The final pathology showed an ER positive tumor, HER-2 negative.  She completed radiation on 11/18/12 and started taking Tamoxifen on 11/21/2012.  It is also noted that she is BRCA negative.   Her subsequent history is as detailed below  INTERVAL HISTORY:  Sydney Cervantes returns today for follow-up and treatment of her estrogen receptor positive breast cancer, accompanied by her husband, Event organiser. She continues on tamoxifen, with great tolerance. She denies having hot flashes or increase in vaginal discharge.      REVIEW OF SYSTEMS: Sydney Cervantes reports that she goes to Yoga twice a week. She takes walks, but not as much as she used too. She is interested in Trials to Recovery program. She denies unusual headaches, visual changes, nausea, vomiting, or  dizziness. There has been no unusual cough, phlegm production, or pleurisy. This been no change in bowel or bladder habits. She denies unexplained fatigue or unexplained weight loss, bleeding, rash, or fever. A detailed review of systems was otherwise stable.    PAST MEDICAL HISTORY: Past Medical History:  Diagnosis Date  . Anemia    "chemo induced" (08/25/2012)  . Breast cancer ([place])    left  . Carcinoid tumor of colon, benign    partial colectomy ,1987  . GERD (gastroesophageal reflux disease)    occ; "side effect of chemo" (08/25/2012)  . Hepatic steatosis 12/27/2013  . Hx of colonoscopy 2000  . Hx of radiation therapy 09/21/12 -11/17/12   R supraclav fossa, L chest wall/SCVL fossa/PAB mid axilla/IM nodes/scar boost  . Seasonal allergies    "when molds and things come out; pollens" (08/25/2012)  . Wears glasses     PAST SURGICAL HISTORY: Past Surgical History:  Procedure Laterality Date  . APPENDECTOMY  1987  . BOWEL RESECTION  1987   Carcinoid Tumor of Appendix  . BREAST BIOPSY  01/2012   left  . BREAST REDUCTION SURGERY Right 01/11/2014   Procedure: RIGHT BREAST REDUCTION ;  Surgeon: Crissie Reese, MD;  Location: [facility];  Service: Plastics;  Laterality: Right;  . CHOLECYSTECTOMY  2003  . COLONOSCOPY N/A 05/11/2014   Procedure: COLONOSCOPY;  Surgeon: Shann Medal, MD;  Location[facility];  Service: General;  Laterality: N/A;  . DILATION AND CURETTAGE OF UTERUS  1992  . LATISSIMUS FLAP TO BREAST Left 01/11/2014   Procedure: LEFT LATISSIMUS FLAP TO BREAST WITH SALINE IMPLANT  FOR RECONSTRUCTION;  Surgeon: Crissie Reese, MD;  Location: [facility];  Service: Plastics;  Laterality: Left;  Marland Kitchen MASTECTOMY MODIFIED RADICAL  08/25/2012   left  . PORTACATH PLACEMENT  02/11/2012   Procedure: INSERTION PORT-A-CATH;  Surgeon: Shann Medal, MD;  Location: [facility];  Service: General;  Laterality: Right;  Subclavian     FAMILY HISTORY Family History  Problem Relation Age of Onset  . Breast cancer  Mother   . Breast cancer Paternal Aunt   . Cancer Maternal Grandmother        Hodgkins Disease    GYNECOLOGIC HISTORY:   G3P2.  Menarche at 56 years of age.  Followed by [name], NP.    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 Scott works in Engineer, technical sales for [employer]. Daughter Joellen Jersey currently 5 years old is a Environmental education officer in psychology and exercise physiology hoping to become an occupational therapist. Daughter Raquel Sarna studied Therapist, occupational at [employer]	[place] and hopes to be in [place] ; she is currently living at home with the patient. Daughter Burman Nieves is currently a Art therapist in [place], [place] at an [employer]. Maggie would like to teach at an university for Music History.   HEALTH MAINTENANCE:  (Updated January 2015) Social History   Social History  . Marital status: Married    Spouse name: N/A  . Number of children: N/A  . Years of education: N/A   Occupational History  . Not on file.   Social History Main Topics  . Smoking status: Never Smoker  . Smokeless tobacco: Never Used  . Alcohol use 0.0 oz/week    7 - 10 Glasses of wine per week     Comment: 06/26/2013 - 1-1.5 glasses of wine nightly  . Drug use: No  . Sexual activity: Yes   Other Topics Concern  . Not on file   Social History Narrative  . No narrative on file     COLONOSCOPY: Approx 2000  PAP:  Not on file  BONE DENSITY:   January 2016  LIPIDS:  Not on file     Allergies  Allergen Reactions  . Adhesive [Tape] Itching and Rash    Rash and itching at steristrip and EKG lead pad sites  . Codeine     Headache    Current Outpatient Prescriptions  Medication Sig Dispense Refill  . Calcium Carbonate-Vitamin D (CALTRATE 600+D PO) Take 1 tablet by mouth daily.     . Probiotic Product (PROBIOTIC DAILY PO) Take 2 capsules by mouth  daily.    . tamoxifen (NOLVADEX) 20 MG tablet Take 1 tablet (20 mg total) by mouth daily. 90 tablet 4   No current facility-administered medications for this visit.    Directives: Middle-aged white woman appears well  Vitals:   09/29/17 1036  BP: 123/78  Pulse: 82  Resp: 18  Temp: 98.3 F (36.8 C)  SpO2: 100%   Body mass index is 22.9 kg/m. ECOG FS: 0 - Asymptomatic  Sclerae unicteric, EOMs intact Oropharynx clear and moist No cervical or supraclavicular adenopathy Lungs no rales or rhonchi Heart regular rate and rhythm Abd soft, nontender, positive bowel sounds MSK no focal spinal tenderness, no upper extremity lymphedema Neuro: nonfocal, well oriented, appropriate affect Breasts: The right breast is status post reduction mammoplasty. She has occasional discomfort in the inferior aspect of that, which is  stable. Left breast is status post mastectomy with TRAM reconstruction. There is no evidence of chest wall recurrence. Both axillae are benign.  LAB RESULTS: CBC    Component Value Date/Time   WBC 4.4 09/29/2017 1009   WBC 5.4 01/02/2014 1337   RBC 4.38 09/29/2017 1009   RBC 4.15 01/02/2014 1337   HGB 14.4 09/29/2017 1009   HCT 43.4 09/29/2017 1009   PLT 133 (L) 09/29/2017 1009   MCV 99.1 09/29/2017 1009   MCH 32.9 09/29/2017 1009   MCH 32.8 01/02/2014 1337   MCHC 33.2 09/29/2017 1009   MCHC 33.4 01/02/2014 1337   RDW 13.4 09/29/2017 1009   LYMPHSABS 1.3 09/29/2017 1009   MONOABS 0.5 09/29/2017 1009   EOSABS 0.2 09/29/2017 1009   BASOSABS 0.0 09/29/2017 1009   CMP Latest Ref Rng & Units 09/29/2017 08/31/2016 02/24/2016  Glucose 70 - 140 mg/dl 98 98 113  BUN 7.0 - 26.0 mg/dL 13.7 13.8 20.3  Creatinine 0.6 - 1.1 mg/dL 0.8 0.9 0.9  Sodium 136 - 145 mEq/L 141 143 143  Potassium 3.5 - 5.1 mEq/L 3.7 4.1 4.9  Chloride 96 - 112 mEq/L - - -  CO2 22 - 29 mEq/L 23 26 30(H)  Calcium 8.4 - 10.4 mg/dL 9.1 9.7 9.1  Total Protein 6.4 - 8.3 g/dL 6.3(L) 7.5 6.4  Total  Bilirubin 0.20 - 1.20 mg/dL 0.58 0.47 0.37  Alkaline Phos 40 - 150 U/L 67 98 89  AST 5 - 34 U/L 39(H) 40(H) 29  ALT 0 - 55 U/L 40 37 25    STUDIES:  Six-month right Mammography at [facility] 01/14/2016 showed the breast density to be category B. In the area of calcifications in the right breast was felt to be benign. Routine mammography was recommended in one year    ASSESSMENT:  56 y.o. BRCA negative  woman  (1) s/p left breast lower outer quadrant and left axillary lymph node biopsies 01/18/2012 for a clinical T4 N3, stage IIIC invasive lobular carcinoma, grade 3, estrogen receptor 62% positive, progesterone receptor 5% positive, with an MIB-1 of 33% and no HER-2 amplification.  (2) Began chemotherapy in March 2013, and received 4/6 planned neoadjuvant cycles of dose dense cyclophosphamide, epirubicin and fluorouracil followed by weekly paclitaxel x 12 completed Taxol 08/02/2012.   (3) left modified radical mastectomy 08/25/2012 showed no residual tumor in the breast, but 7/8 sampled lymph nodes were positive, 6 with macro metastases. Repeat HER-2 was again negative.  (4) She completed radiation on 11/18/12   (5) started tamoxifen on 11/21/2012  (a) opted against switching to aromatase inhibitors  (6) on 01/11/2014 underwent left latissimus dorsi myocutaneous flap reconstruction of the left breast, with saline implant, and a right breast mammographic C4 symmetry  (7) history of a cecal carcinoid resected 1987, with an unremarkable colonoscopy 05/14/2014  (8) hepatic steatosis, with occasional AST elevation: no trend noted  (9) osteopenia -   (a) T-score   -1.9 on dexa  12/21/14 at [facility]: Sydney Cervantes is now 5 years out from definitive surgery for her breast cancer with no evidence of disease recurrence. This is very favorable.  She continues on tamoxifen, with very good tolerance. The plan will be to continue that for a total of 10 years.  She understands that tamoxifen  also helps with bone density issues. She did have significant osteopenia when last checked, about 2 years ago. I think it would be prudent to recheck a bone density next year and have entered the orders  for that.  I have also encouraged her to start her walking program back again. I gave her information on the trails to recovery program.  She understands as far as vaginal dryness is concerned she can't use vaginal estrogens while on tamoxifen. I referred her to the pelvic health program and if she decided she would like to try the estrogen cream or Estring she will let me know.   Otherwise she will see me again a year from now. She knows to call for any problems that may develop before the next visit here.  Moussa Wiegand, Virgie Dad, MD  09/29/17 11:02 AM Medical Oncology and Hematology [facility] [address] [place], [place] [address] Tel. [phone]    Fax. [phone]  This document serves as a record of services personally performed by Lurline Del, MD. It was created on her behalf by Steva Colder, a trained medical scribe. The creation of this record is based on the scribe's personal observations and the provider's statements to them. This document has been checked and approved by the attending provider.

## 2017-09-28 ENCOUNTER — Other Ambulatory Visit: Payer: Self-pay | Admitting: *Deleted

## 2017-09-28 DIAGNOSIS — C50512 Malignant neoplasm of lower-outer quadrant of left female breast: Secondary | ICD-10-CM

## 2017-09-29 ENCOUNTER — Other Ambulatory Visit (HOSPITAL_BASED_OUTPATIENT_CLINIC_OR_DEPARTMENT_OTHER): Payer: Commercial Managed Care - PPO

## 2017-09-29 ENCOUNTER — Telehealth: Payer: Self-pay | Admitting: Oncology

## 2017-09-29 ENCOUNTER — Ambulatory Visit (HOSPITAL_BASED_OUTPATIENT_CLINIC_OR_DEPARTMENT_OTHER): Payer: Commercial Managed Care - PPO | Admitting: Oncology

## 2017-09-29 VITALS — BP 123/78 | HR 82 | Temp 98.3°F | Resp 18 | Ht 68.0 in | Wt 150.6 lb

## 2017-09-29 DIAGNOSIS — C773 Secondary and unspecified malignant neoplasm of axilla and upper limb lymph nodes: Secondary | ICD-10-CM | POA: Diagnosis not present

## 2017-09-29 DIAGNOSIS — Z170 Estrogen receptor positive status [ER+]: Secondary | ICD-10-CM | POA: Diagnosis not present

## 2017-09-29 DIAGNOSIS — C50512 Malignant neoplasm of lower-outer quadrant of left female breast: Secondary | ICD-10-CM

## 2017-09-29 DIAGNOSIS — N898 Other specified noninflammatory disorders of vagina: Secondary | ICD-10-CM | POA: Diagnosis not present

## 2017-09-29 DIAGNOSIS — M8580 Other specified disorders of bone density and structure, unspecified site: Secondary | ICD-10-CM

## 2017-09-29 LAB — COMPREHENSIVE METABOLIC PANEL
ALBUMIN: 3.8 g/dL (ref 3.5–5.0)
ALT: 40 U/L (ref 0–55)
ANION GAP: 11 meq/L (ref 3–11)
AST: 39 U/L — ABNORMAL HIGH (ref 5–34)
Alkaline Phosphatase: 67 U/L (ref 40–150)
BUN: 13.7 mg/dL (ref 7.0–26.0)
CALCIUM: 9.1 mg/dL (ref 8.4–10.4)
CHLORIDE: 107 meq/L (ref 98–109)
CO2: 23 meq/L (ref 22–29)
Creatinine: 0.8 mg/dL (ref 0.6–1.1)
Glucose: 98 mg/dl (ref 70–140)
Potassium: 3.7 mEq/L (ref 3.5–5.1)
Sodium: 141 mEq/L (ref 136–145)
TOTAL PROTEIN: 6.3 g/dL — AB (ref 6.4–8.3)
Total Bilirubin: 0.58 mg/dL (ref 0.20–1.20)

## 2017-09-29 LAB — CBC WITH DIFFERENTIAL/PLATELET
BASO%: 0.7 % (ref 0.0–2.0)
BASOS ABS: 0 10*3/uL (ref 0.0–0.1)
EOS ABS: 0.2 10*3/uL (ref 0.0–0.5)
EOS%: 5.6 % (ref 0.0–7.0)
HCT: 43.4 % (ref 34.8–46.6)
HEMOGLOBIN: 14.4 g/dL (ref 11.6–15.9)
LYMPH#: 1.3 10*3/uL (ref 0.9–3.3)
LYMPH%: 30.3 % (ref 14.0–49.7)
MCH: 32.9 pg (ref 25.1–34.0)
MCHC: 33.2 g/dL (ref 31.5–36.0)
MCV: 99.1 fL (ref 79.5–101.0)
MONO#: 0.5 10*3/uL (ref 0.1–0.9)
MONO%: 10.7 % (ref 0.0–14.0)
NEUT#: 2.3 10*3/uL (ref 1.5–6.5)
NEUT%: 52.7 % (ref 38.4–76.8)
Platelets: 133 10*3/uL — ABNORMAL LOW (ref 145–400)
RBC: 4.38 10*6/uL (ref 3.70–5.45)
RDW: 13.4 % (ref 11.2–14.5)
WBC: 4.4 10*3/uL (ref 3.9–10.3)

## 2017-09-29 NOTE — Telephone Encounter (Signed)
Scheduled appt per 10/24 los. Patient did not want avs or calendar.

## 2017-09-30 ENCOUNTER — Encounter: Payer: Self-pay | Admitting: Physical Therapy

## 2017-09-30 ENCOUNTER — Ambulatory Visit: Payer: Commercial Managed Care - PPO | Attending: Family Medicine | Admitting: Physical Therapy

## 2017-09-30 ENCOUNTER — Other Ambulatory Visit: Payer: Self-pay | Admitting: Oncology

## 2017-09-30 DIAGNOSIS — M62838 Other muscle spasm: Secondary | ICD-10-CM | POA: Diagnosis present

## 2017-09-30 NOTE — Patient Instructions (Signed)
Moisturizers . They are used in the vagina to hydrate the mucous membrane that make up the vaginal canal. . Designed to keep a more normal acid balance (ph) . Once placed in the vagina, it will last between two to three days.  . Use 2-3 times per week at bedtime and last longer than 60 min. . Ingredients to avoid is glycerin and fragrance, can increase chance of infection . Should not be used just before sex due to causing irritation . Most are gels administered either in a tampon-shaped applicator or as a vaginal suppository. They are non-hormonal.   Types of Moisturizers . Samul Dada- drug store . Vitamin E vaginal suppositories- Whole foods, Amazon . Moist Again . Coconut oil- can break down condoms . Michail Jewels . Yes moisturizer- amazon . NeuEve Silk for under 65, NeuEve Silver for menopausal or over 65 (if have severe vaginal atrophy or cancer treatments use NeuEve Cream 1 month than move to NeuEve Level 1)- [employer] . Olive and Bee intimate cream- www.oliveandbee.com.au  Creams to use externally on the Vulva area  Albertson's (good for for cancer patients that had radiation to the area)- [place] or [employer].FlyingBasics.com.br  V-magic cream - amazon  Julva-amazon  Vital "V Wild Yam salve ( help moisturize and help with thinning vulvar area, does have [place]   Things to avoid in the vaginal area . Do not use things to irritate the vulvar area . No lotions just specialized creams for the vulva area- Neogyn, V-magic, No soaps; can use Aveeno or Calendula cleanser if needed. Must be gentle . No deodorants . No douches . Good to sleep without underwear to let the vaginal area to air out . No scrubbing: spread the lips to let warm water rinse over labias and pat dry   Lubrication . Used for intercourse to reduce friction . Avoid ones that have glycerin, warming gels, tingling gels, icing or cooling gel,  scented . Avoid parabens due to a preservative similar to female sex hormone . May need to be reapplied once or several times during sexual activity . Can be applied to both partners genitals prior to vaginal penetration to minimize friction or irritation . Prevent irritation and mucosal tears that cause post coital pain and increased the risk of vaginal and urinary tract infections . Oil-based lubricants cannot be used with condoms due to breaking them down.  Least likely to irritate vaginal tissue.  . Plant based-lubes are safe . Silicone-based lubrication are thicker and last long and used for post-menopausal women  Vaginal Lubricators Here is a list of some suggested lubricators you can use for intercourse. Use the most hypoallergenic product.  You can place on you or your partner.   Slippery Stuff  Sylk or Sliquid Natural H2O ( good  if frequent UTI's)  Blossom Organics (www.blossom-organics.com)  Luvena   Coconut oil  PJur Woman Nude- water based lubricant, amazon  Uberlube- Amazon  Aloe Vera  Yes lubricant- [employer] Platinum-Silicone, Target, Walgreens  Olive and Bee intimate cream-  www.oliveandbee.com.au Things to avoid in lubricants are glycerin, warming gels, tingling gels, icing or cooling  gels, and scented gels.  Also avoid Vaseline. KY jelly, Replens, and Astroglide kills good bacteria(lactobacilli)  Things to avoid in the vaginal area . Do not use things to irritate the vulvar area . No lotions- see below . No soaps; can use Aveeno or Calendula cleanser if needed. Must be gentle . No  deodorants . No douches . Good to sleep without underwear to let the vaginal area to air out . No scrubbing: spread the lips to let warm water rinse over labias and pat dry  Creams that can be used on the [place] Releveum or [facility] [address], [place], [place] [address] Phone # [phone] Fax [phone]

## 2017-09-30 NOTE — Therapy (Signed)
[facility] [address], [place], [place], [address] Phone: [phone]   Fax:  [phone]  Physical Therapy Evaluation  Patient Details  Name: Sydney Cervantes MRN: [id number] Date of Birth: 01/14/1961 Referring Provider: Dr. Lurline Del  Encounter Date: 09/30/2017      PT End of Session - 09/30/17 1246    Visit Number 1   PT Start Time 1145   PT Stop Time 1230   PT Time Calculation (min) 45 min   Activity Tolerance Patient tolerated treatment well   Behavior During Therapy [facility] for tasks assessed/performed      Past Medical History:  Diagnosis Date  . Anemia    "chemo induced" (08/25/2012)  . Breast cancer ([place])    left  . Carcinoid tumor of colon, benign    partial colectomy ,1987  . GERD (gastroesophageal reflux disease)    occ; "side effect of chemo" (08/25/2012)  . Hepatic steatosis 12/27/2013  . Hx of colonoscopy 2000  . Hx of radiation therapy 09/21/12 -11/17/12   R supraclav fossa, L chest wall/SCVL fossa/PAB mid axilla/IM nodes/scar boost  . Seasonal allergies    "when molds and things come out; pollens" (08/25/2012)  . Wears glasses     Past Surgical History:  Procedure Laterality Date  . APPENDECTOMY  1987  . BOWEL RESECTION  1987   Carcinoid Tumor of Appendix  . BREAST BIOPSY  01/2012   left  . BREAST REDUCTION SURGERY Right 01/11/2014   Procedure: RIGHT BREAST REDUCTION ;  Surgeon: Crissie Reese, MD;  Location: [facility];  Service: Plastics;  Laterality: Right;  . CHOLECYSTECTOMY  2003  . COLONOSCOPY N/A 05/11/2014   Procedure: COLONOSCOPY;  Surgeon: Shann Medal, MD;  [facility];  Service: General;  Laterality: N/A;  . DILATION AND CURETTAGE OF UTERUS  1992  . LATISSIMUS FLAP TO BREAST Left 01/11/2014   Procedure: LEFT LATISSIMUS FLAP TO BREAST WITH SALINE IMPLANT FOR RECONSTRUCTION;  Surgeon: Crissie Reese, MD;  Location: [facility];  Service: Plastics;  Laterality: Left;  Marland Kitchen MASTECTOMY  MODIFIED RADICAL  08/25/2012   left  . PORTACATH PLACEMENT  02/11/2012   Procedure: INSERTION PORT-A-CATH;  Surgeon: Shann Medal, MD;  Location: [facility];  Service: General;  Laterality: Right;  Subclavian    There were no vitals filed for this visit.       Subjective Assessment - 09/30/17 1147    Subjective Patient s/p breast cancer that is estrogen positive. Patient is taking Tomaxafen.  Patient is having pain with intercourse. Pain has been going on since she started Tamocifen 11/21/2012.    Patient Stated Goals reduce pain with intercourse   Currently in Pain? Yes   Pain Score 4    Pain Location Vagina   Pain Orientation Mid   Pain Descriptors / Indicators Grimacing;Other (Comment);Discomfort  like sandpaper   Pain Type Chronic pain   Pain Onset More than a month ago   Pain Frequency Intermittent   Aggravating Factors  intercourse with deeper thrusting   Pain Relieving Factors no intercourse   Multiple Pain Sites No            [facility] PT Assessment - 09/30/17 0001      Assessment   Medical Diagnosis C50.512, Z17.0 Malignant neoplasm of lower- outer quadrant of left breast of female, estrogen receptor positive   Referring Provider Dr. Sarajane Jews Magrinat   Onset Date/Surgical Date 11/21/12   Prior Therapy None     Precautions  Precautions Other (comment)   Precaution Comments breast cancer; neuropathy of feet     Restrictions   Weight Bearing Restrictions No     Balance Screen   Has the patient fallen in the past 6 months No   Has the patient had a decrease in activity level because of a fear of falling?  No   Is the patient reluctant to leave their home because of a fear of falling?  No     Home Ecologist residence     Prior Function   Level of Independence Independent   Vocation Self employed   Leisure Yoga 2 times per week     Cognition   Overall Cognitive Status Within Functional Limits for tasks assessed      Posture/Postural Control   Posture/Postural Control No significant limitations     ROM / Strength   AROM / PROM / Strength AROM;PROM;Strength     AROM   Overall AROM  Within functional limits for tasks performed     PROM   Overall PROM  Within functional limits for tasks performed     Strength   Overall Strength Within functional limits for tasks performed            Objective measurements completed on examination: See above findings.        Pelvic Floor Special Questions - 09/30/17 0001    Currently Sexually Active Yes   Marinoff Scale discomfort that does not affect completion   Urinary Leakage No   Urinary urgency No   Fecal incontinence No   External Perineal Exam dry, vaginal atropy, decreased size of the labia minora   Skin Integrity Erthema  around the introitus area   Pelvic Floor Internal Exam Patient confirms identificaltion and approves PT to assess pelvic floor muscle integrity   Exam Type Vaginal   Palpation Burning sensation in the introitus and vaginal canal that was more related to the dryness   Strength fair squeeze, definite lift                  PT Education - 09/30/17 1242    Education provided Yes   Education Details education on moisturizers for the vaginal area and lubricants to improve vaginal health   Person(s) Educated Patient   Methods Explanation;Handout   Comprehension Verbalized understanding             PT Long Term Goals - 09/30/17 1255      PT LONG TERM GOAL #1   Title understand how to use vaginal moisturizers and lubricants to improve vaginal health and pain with penile penetration   Time 1   Period Days   Status Achieved                Plan - 09/30/17 1246    Clinical Impression Statement Patient is a 56 year old female s/p breast cancer and now is taking Tomoxifen since 11/21/2012. Patient is experiencing pain with penile penetration at level 5/10 and her husband will stop due to patient  visually being in pain.  Pelvic floor strength is 3/5.  Redness is located in the introitus with dryness and external perineum is dry. The Labia minora is decreased in size due to vaginal atropy.  Patient benefit from phsyical therapy to educate on vaginal moistruizers and lubricants to improve her vaginal health and decreased pain with penile penetration.     History and Personal Factors relevant to plan of care: s/P breast cancer  estrogen positive; taking Tamoxfin   Clinical Presentation Stable   Clinical Presentation due to: stable condition   Clinical Decision Making Low   Rehab Potential Excellent   Clinical Impairments Affecting Rehab Potential s/P breast cancer estrogen positive; taking Tamoxfin   PT Frequency One time visit   PT Duration Other (comment)  one time visit for intial eval and treatment   PT Treatment/Interventions Patient/family education   PT Next Visit Plan Current HEP for vaginal moisturizers and lubricants. Discharged this visit   PT Home Exercise Plan Current HEP   Consulted and Agree with Plan of Care Patient      Patient will benefit from skilled therapeutic intervention in order to improve the following deficits and impairments:  Pain  Visit Diagnosis: Other muscle spasm - Plan: PT plan of care cert/re-cert     Problem List Patient Active Problem List   Diagnosis Date Noted  . Osteopenia 01/17/2015  . Hepatic steatosis 12/27/2013  . Malignant neoplasm of lower-outer quadrant of left breast of female, estrogen receptor positive ([place]) 09/26/2013  . Elevated liver enzymes 06/26/2013  . Wears glasses   . Seasonal allergies   . Hx of radiation therapy     Earlie Counts, PT 09/30/17 12:58 PM   [facility] [address], [place], [place], [address] Phone: [phone]   Fax:  [phone]  Name: Sydney Cervantes MRN: [id number] Date of Birth: 1961/11/24

## 2018-04-11 ENCOUNTER — Other Ambulatory Visit: Payer: Self-pay | Admitting: Family Medicine

## 2018-04-12 ENCOUNTER — Other Ambulatory Visit: Payer: Self-pay

## 2018-04-12 DIAGNOSIS — C50512 Malignant neoplasm of lower-outer quadrant of left female breast: Secondary | ICD-10-CM

## 2018-04-12 DIAGNOSIS — Z170 Estrogen receptor positive status [ER+]: Principal | ICD-10-CM

## 2018-04-12 NOTE — Progress Notes (Signed)
Per [facility], pt prefers to go to [place] for her Bone Density since she has her mammograms there.  Canceled the order for Bone density at [facility] imaging and added order for [facility] for the bone density.

## 2018-08-24 ENCOUNTER — Encounter: Payer: Self-pay | Admitting: Oncology

## 2018-09-28 ENCOUNTER — Other Ambulatory Visit: Payer: Self-pay

## 2018-09-28 DIAGNOSIS — C50512 Malignant neoplasm of lower-outer quadrant of left female breast: Secondary | ICD-10-CM

## 2018-09-28 DIAGNOSIS — Z170 Estrogen receptor positive status [ER+]: Principal | ICD-10-CM

## 2018-09-28 NOTE — Progress Notes (Addendum)
Sydney Cervantes   DOB: 02-07-1961  MR#: [id number]  ZYS#:[id number]  GYN:  Sydney Cervantes, Sydney Cervantes  SU:  Dr. Alphonsa Cervantes  OTHER MD: Sydney Reese, MD:  Sydney Morin, MD  CHIEF COMPLAINT: Estrogen receptor positive breast Cancer  CURRENT TREATMENT: Tamoxifen  BREAST CANCER HISTORY: From Dr. Rada Cervantes prior summary note:  1. 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  ER/PR positive at 63/5% respectively, HER-2 negative, elevated Ki-67. Completed 4/6 planned neoadjuvant dose dense FEC with Neulasta support on day 2. 20% dose reduction on 5-FU, 50% dose reduction on epirubicin, 40% dose reduction on Cytoxan, after one-week delay due to anemia/thrombocytopenia/and region of probable localized thrombophlebitis versus mild chemotherapy extravasation on the left forearm, covered with Keflex 500 mg by mouth 3 times a day. Switched to single agent neoadjuvant Taxol, given 3 weeks on 1 week off, completed 4 cycles on 08/02/2012.   2. Status post left modified radical mastectomy on 08/25/2012 with final pathology showing no tumor in the primary tumor bed with 7 of 8 involved lymph nodes. The final pathology showed an ER positive tumor, HER-2 negative.  She completed radiation on 11/18/12 and started taking Tamoxifen on 11/21/2012.  It is also noted that she is BRCA negative.   Her subsequent history is as detailed below  INTERVAL HISTORY:  Sydney Cervantes returns today for follow-up and treatment of her estrogen receptor positive breast cancer, accompanied by her husband, Sydney Cervantes. She continues on tamoxifen, with great tolerance. She reports rare hot flashes and denies vaginal discharge.   In fact she has significant problems with vaginal dryness.  She did come to the "pelvic health" program and did really derive some benefit from that.  Since her last visit to the office, she underwent bone density scan on 08/24/2018.  It showed low bone mass, and her T-score was -1.8 with the lowest score in her left hip.  REVIEW OF SYSTEMS: Sydney Cervantes reports that for exercise, she walks outside for about an hour a couple times a week and continues with yoga twice a week. She reports feeling good Cervantes. She still works at home. Her daughters are doing well. She reports increased vaginal dryness. The patient denies unusual headaches, visual changes, nausea, vomiting, stiff neck, dizziness, or gait imbalance. There has been no cough, phlegm production, or pleurisy, no chest pain or pressure, and no change in bowel or bladder habits. The patient denies fever, rash, bleeding, unexplained fatigue or unexplained weight loss. A detailed review of systems was otherwise entirely negative.   PAST MEDICAL HISTORY: Past Medical History:  Diagnosis Date  . Anemia    "chemo induced" (08/25/2012)  . Breast cancer (Sydney Cervantes)    left  . Carcinoid tumor of colon, benign    partial colectomy ,1987  . GERD (gastroesophageal reflux disease)    occ; "side effect of chemo" (08/25/2012)  . Hepatic steatosis 12/27/2013  . Hx of colonoscopy 2000  . Hx of radiation therapy 09/21/12 -11/17/12   R supraclav fossa, L chest wall/SCVL fossa/PAB mid axilla/IM nodes/scar boost  . Seasonal allergies    "when molds and things come out; pollens" (08/25/2012)  . Wears glasses     PAST SURGICAL HISTORY: Past Surgical History:  Procedure Laterality Date  . APPENDECTOMY  1987  . BOWEL RESECTION  1987   Carcinoid Tumor of Appendix  . BREAST BIOPSY  01/2012   left  . BREAST REDUCTION SURGERY Right 01/11/2014  Procedure: RIGHT BREAST REDUCTION ;  Surgeon: Sydney Reese, MD;  Location: [facility];  Service: Plastics;  Laterality: Right;  . CHOLECYSTECTOMY  2003  . COLONOSCOPY N/A 05/11/2014   Procedure: COLONOSCOPY;  Surgeon: Shann Medal, MD;  Location: [facility];  Service: General;  Laterality: N/A;  . DILATION AND CURETTAGE OF UTERUS  1992  . LATISSIMUS FLAP TO  BREAST Left 01/11/2014   Procedure: LEFT LATISSIMUS FLAP TO BREAST WITH SALINE IMPLANT FOR RECONSTRUCTION;  Surgeon: Sydney Reese, MD;  Location: [place];  Service: Plastics;  Laterality: Left;  Marland Kitchen MASTECTOMY MODIFIED RADICAL  08/25/2012   left  . PORTACATH PLACEMENT  02/11/2012   Procedure: INSERTION PORT-A-CATH;  Surgeon: Shann Medal, MD;  Location: [facility];  Service: General;  Laterality: Right;  Subclavian     FAMILY HISTORY Family History  Problem Relation Age of Onset  . Breast cancer Mother   . Breast cancer Paternal Aunt   . Cancer Maternal Grandmother        Hodgkins Disease    GYNECOLOGIC HISTORY:   G3P2.  Menarche at 57 years of age.  Followed by Sydney Cervantes, Sydney Cervantes.    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 Sydney Cervantes works in Engineer, technical sales for [employer]. Daughter Sydney Cervantes currently 58 years old is a Environmental education officer in psychology and exercise physiology hoping to become an occupational therapist. Daughter Sydney Cervantes studied Therapist, occupational at [employer]	[place] and hopes to be in [place] ; she is currently living at home with the patient. Daughter Sydney Cervantes is currently in [employer].   HEALTH MAINTENANCE:  (Updated January 2015) Social History   Socioeconomic History  . Marital status: Married    Spouse name: Not on file  . Number of children: Not on file  . Years of education: Not on file  . Highest education level: Not on file  Occupational History  . Not on file  Social Needs  . Financial resource strain: Not on file  . Food insecurity:    Worry: Not on file    Inability: Not on file  . Transportation needs:    Medical: Not on file    Non-medical: Not on file  Tobacco Use  . Smoking status: Never Smoker  . Smokeless tobacco: Never Used  Substance and Sexual Activity  . Alcohol use: Yes     Alcohol/week: 7.0 - 10.0 standard drinks    Types: 7 - 10 Glasses of wine per week    Comment: 06/26/2013 - 1-1.5 glasses of wine nightly  . Drug use: No  . Sexual activity: Yes  Lifestyle  . Physical activity:    Days per week: Not on file    Minutes per session: Not on file  . Stress: Not on file  Relationships  . Social connections:    Talks on phone: Not on file    Gets together: Not on file    Attends religious service: Not on file    Active member of club or organization: Not on file    Attends meetings of clubs or organizations: Not on file    Relationship status: Not on file  . Intimate partner violence:    Fear of current or ex partner: Not on file    Emotionally abused: Not on file    Physically abused: Not on file    Forced sexual activity:  Not on file  Other Topics Concern  . Not on file  Social History Narrative  . Not on file     COLONOSCOPY: Approx 2000  PAP:  Not on file  BONE DENSITY:   January 2016  LIPIDS:  Not on file     Allergies  Allergen Reactions  . Adhesive [Tape] Itching and Rash    Rash and itching at steristrip and EKG lead pad sites  . Codeine     Headache    Current Outpatient Medications  Medication Sig Dispense Refill  . Calcium Carbonate-Vitamin D (CALTRATE 600+D PO) Take 1 tablet by mouth daily.     . cholecalciferol (VITAMIN D) 1000 units tablet Take 1 tablet (1,000 Units total) by mouth daily.    . Cyanocobalamin (B-12) 1000 MCG CAPS Take by mouth.    . estradiol (ESTRING) 2 MG vaginal ring Place 2 mg vaginally every 3 (three) months. follow package directions 1 each 12  . Probiotic Product (PROBIOTIC DAILY PO) Take 2 capsules by mouth daily.    . tamoxifen (NOLVADEX) 20 MG tablet Take 1 tablet (20 mg total) by mouth daily. 90 tablet 4   No current facility-administered medications for this visit.    Directives: Middle-aged white woman in no acute distress  Vitals:   09/29/18 1025  BP: 127/86  Pulse: 81  Resp: 18  Temp:  98.6 F (37 C)  SpO2: 100%   Body mass index is 23.23 kg/m. ECOG FS: 0 - Asymptomatic  Sclerae unicteric, pupils round and equal Oropharynx clear and moist No cervical or supraclavicular adenopathy Lungs no rales or rhonchi Heart regular rate and rhythm Abd soft, nontender, positive bowel sounds MSK no focal spinal tenderness, chronic grade 1 left upper extremity lymphedema, compression sleeve in place Neuro: nonfocal, well oriented, appropriate affect Breasts: The right breast has undergone reduction mammoplasty.  There are no suspicious findings.  The left breast is status post mastectomy with saline reconstruction.  There is no evidence of chest wall recurrence.  Both axillae are benign.  LAB RESULTS: CBC    Component Value Date/Time   WBC 5.5 09/29/2018 0953   WBC 4.4 09/29/2017 1009   WBC 5.4 01/02/2014 1337   RBC 4.23 09/29/2018 0953   HGB 14.0 09/29/2018 0953   HGB 14.4 09/29/2017 1009   HCT 42.9 09/29/2018 0953   HCT 43.4 09/29/2017 1009   PLT 152 09/29/2018 0953   PLT 133 (L) 09/29/2017 1009   MCV 101.4 (H) 09/29/2018 0953   MCV 99.1 09/29/2017 1009   MCH 33.1 09/29/2018 0953   MCHC 32.6 09/29/2018 0953   RDW 12.8 09/29/2018 0953   RDW 13.4 09/29/2017 1009   LYMPHSABS 1.5 09/29/2018 0953   LYMPHSABS 1.3 09/29/2017 1009   MONOABS 0.6 09/29/2018 0953   MONOABS 0.5 09/29/2017 1009   EOSABS 0.5 09/29/2018 0953   EOSABS 0.2 09/29/2017 1009   BASOSABS 0.0 09/29/2018 0953   BASOSABS 0.0 09/29/2017 1009   CMP Latest Ref Rng & Units 09/29/2018 09/29/2017 08/31/2016  Glucose 70 - 99 mg/dL 97 98 98  BUN 6 - 20 mg/dL 15 13.7 13.8  Creatinine 0.44 - 1.00 mg/dL 0.83 0.8 0.9  Sodium 135 - 145 mmol/L 141 141 143  Potassium 3.5 - 5.1 mmol/L 4.1 3.7 4.1  Chloride 98 - 111 mmol/L 107 - -  CO2 22 - 32 mmol/L 21(L) 23 26  Calcium 8.9 - 10.3 mg/dL 9.3 9.1 9.7  Total Protein 6.5 - 8.1 g/dL 6.6 6.3(L) 7.5  Total  Bilirubin 0.3 - 1.2 mg/dL 0.5 0.58 0.47  Alkaline Phos 38 - 126  U/L 80 67 98  AST 15 - 41 U/L 30 39(H) 40(H)  ALT 0 - 44 U/L 30 40 37    STUDIES:  We are trying to obtain the mammography done recently at [facility].  Per patient report it was normal  ASSESSMENT:  57 y.o. BRCA negative [place] woman  (1) s/p left breast lower outer quadrant and left axillary lymph node biopsies 01/18/2012 for a clinical T4 N3, stage IIIC invasive lobular carcinoma, grade 3, estrogen receptor 62% positive, progesterone receptor 5% positive, with an MIB-1 of 33% and no HER-2 amplification.  (2) Began chemotherapy in March 2013, and received 4/6 planned neoadjuvant cycles of dose dense cyclophosphamide, epirubicin and fluorouracil followed by weekly paclitaxel x 12 completed Taxol 08/02/2012.   (3) left modified radical mastectomy 08/25/2012 showed no residual tumor in the breast, but 7/8 sampled lymph nodes were positive, 6 with macro metastases. Repeat HER-2 was again negative.  (4) She completed radiation on 11/18/12   (5) started tamoxifen on 11/21/2012  (a) opted against switching to aromatase inhibitors  (6) on 01/11/2014 underwent left latissimus dorsi myocutaneous flap reconstruction of the left breast, with saline implant, and right breast mammoplasty for symmetry  (7) history of a cecal carcinoid resected 1987, with an unremarkable colonoscopy 05/14/2014  (8) hepatic steatosis, with occasional AST elevation: no trend noted  (9) osteopenia -   (a) T-score   -1.9 on dexa  12/21/14 at [facility]  (b) repeat bone density 08/24/2018 T score -1.8  PLAN: Willeen is now 6 years out from definitive surgery for her breast cancer with no evidence of disease recurrence.  This is very favorable.  She is tolerating tamoxifen well and the plan is to continue that a total of 10 years.  Today we discussed vaginal dryness issues at length.  While she is on tamoxifen I feel comfortable with her using vaginal estrogen and I have called in [facility] for her.  She will let me know  whether or not it works and whether or not it causes her any problems.  Apparently her DEXA scan was not entered correctly in the orders or billing and therefore she is being billed for this.  We will see if we can correct that.  She will see me again in 1 year.  She knows to call for any problems that may develop before that visit.  Magrinat, Virgie Dad, MD  09/29/18 10:53 AM Medical Oncology and Hematology [facility] [address] [place], [place] [address] Tel. [phone]    Fax. [phone]  This document serves as a record of services personally performed by Lurline Del, MD. It was created on his behalf by Wilburn Mylar, a trained medical scribe. The creation of this record is based on the scribe's personal observations and the provider's statements to them.   I, Lurline Del MD, have reviewed the above documentation for accuracy and completeness, and I agree with the above.

## 2018-09-29 ENCOUNTER — Inpatient Hospital Stay: Payer: Commercial Managed Care - PPO

## 2018-09-29 ENCOUNTER — Telehealth: Payer: Self-pay | Admitting: Oncology

## 2018-09-29 ENCOUNTER — Inpatient Hospital Stay: Payer: Commercial Managed Care - PPO | Attending: Oncology | Admitting: Oncology

## 2018-09-29 VITALS — BP 127/86 | HR 81 | Temp 98.6°F | Resp 18 | Ht 68.0 in | Wt 152.8 lb

## 2018-09-29 DIAGNOSIS — Z803 Family history of malignant neoplasm of breast: Secondary | ICD-10-CM | POA: Diagnosis not present

## 2018-09-29 DIAGNOSIS — Z170 Estrogen receptor positive status [ER+]: Secondary | ICD-10-CM

## 2018-09-29 DIAGNOSIS — R232 Flushing: Secondary | ICD-10-CM | POA: Diagnosis not present

## 2018-09-29 DIAGNOSIS — Z808 Family history of malignant neoplasm of other organs or systems: Secondary | ICD-10-CM | POA: Insufficient documentation

## 2018-09-29 DIAGNOSIS — Z85030 Personal history of malignant carcinoid tumor of large intestine: Secondary | ICD-10-CM

## 2018-09-29 DIAGNOSIS — Z7982 Long term (current) use of aspirin: Secondary | ICD-10-CM | POA: Diagnosis not present

## 2018-09-29 DIAGNOSIS — M8580 Other specified disorders of bone density and structure, unspecified site: Secondary | ICD-10-CM | POA: Diagnosis not present

## 2018-09-29 DIAGNOSIS — Z9221 Personal history of antineoplastic chemotherapy: Secondary | ICD-10-CM | POA: Diagnosis not present

## 2018-09-29 DIAGNOSIS — C50512 Malignant neoplasm of lower-outer quadrant of left female breast: Secondary | ICD-10-CM | POA: Insufficient documentation

## 2018-09-29 DIAGNOSIS — D6481 Anemia due to antineoplastic chemotherapy: Secondary | ICD-10-CM | POA: Diagnosis not present

## 2018-09-29 DIAGNOSIS — Z79899 Other long term (current) drug therapy: Secondary | ICD-10-CM | POA: Diagnosis not present

## 2018-09-29 DIAGNOSIS — Z9012 Acquired absence of left breast and nipple: Secondary | ICD-10-CM | POA: Diagnosis not present

## 2018-09-29 DIAGNOSIS — K760 Fatty (change of) liver, not elsewhere classified: Secondary | ICD-10-CM | POA: Insufficient documentation

## 2018-09-29 DIAGNOSIS — K219 Gastro-esophageal reflux disease without esophagitis: Secondary | ICD-10-CM | POA: Insufficient documentation

## 2018-09-29 DIAGNOSIS — Z79810 Long term (current) use of selective estrogen receptor modulators (SERMs): Secondary | ICD-10-CM | POA: Diagnosis not present

## 2018-09-29 DIAGNOSIS — Z923 Personal history of irradiation: Secondary | ICD-10-CM

## 2018-09-29 LAB — CMP (CANCER CENTER ONLY)
ALT: 30 U/L (ref 0–44)
AST: 30 U/L (ref 15–41)
Albumin: 3.9 g/dL (ref 3.5–5.0)
Alkaline Phosphatase: 80 U/L (ref 38–126)
Anion gap: 13 (ref 5–15)
BUN: 15 mg/dL (ref 6–20)
CHLORIDE: 107 mmol/L (ref 98–111)
CO2: 21 mmol/L — AB (ref 22–32)
CREATININE: 0.83 mg/dL (ref 0.44–1.00)
Calcium: 9.3 mg/dL (ref 8.9–10.3)
GFR, Est AFR Am: >60 mL/min (ref >60)
GFR, Estimated: >60 mL/min (ref >60)
GLUCOSE: 97 mg/dL (ref 70–99)
Potassium: 4.1 mmol/L (ref 3.5–5.1)
SODIUM: 141 mmol/L (ref 135–145)
Total Bilirubin: 0.5 mg/dL (ref 0.3–1.2)
Total Protein: 6.6 g/dL (ref 6.5–8.1)

## 2018-09-29 LAB — CBC WITH DIFFERENTIAL (CANCER CENTER ONLY)
Abs Immature Granulocytes: 0.01 10*3/uL (ref 0.00–0.07)
BASOS ABS: 0 10*3/uL (ref 0.0–0.1)
Basophils Relative: 1 %
EOS PCT: 9 %
Eosinophils Absolute: 0.5 10*3/uL (ref 0.0–0.5)
HEMATOCRIT: 42.9 % (ref 36.0–46.0)
HEMOGLOBIN: 14 g/dL (ref 12.0–15.0)
Immature Granulocytes: 0 %
LYMPHS ABS: 1.5 10*3/uL (ref 0.7–4.0)
LYMPHS PCT: 27 %
MCH: 33.1 pg (ref 26.0–34.0)
MCHC: 32.6 g/dL (ref 30.0–36.0)
MCV: 101.4 fL — AB (ref 80.0–100.0)
MONO ABS: 0.6 10*3/uL (ref 0.1–1.0)
MONOS PCT: 11 %
Neutro Abs: 2.8 10*3/uL (ref 1.7–7.7)
Neutrophils Relative %: 52 %
Platelet Count: 152 10*3/uL (ref 150–400)
RBC: 4.23 MIL/uL (ref 3.87–5.11)
RDW: 12.8 % (ref 11.5–15.5)
WBC Count: 5.5 10*3/uL (ref 4.0–10.5)
nRBC: 0 % (ref 0.0–0.2)

## 2018-09-29 MED ORDER — TAMOXIFEN CITRATE 20 MG PO TABS: 20.0000 mg | ORAL_TABLET | Freq: Every day | ORAL | 4 refills | Status: DC

## 2018-09-29 MED ORDER — ESTRADIOL 2 MG VA RING: 2.0000 mg | VAGINAL_RING | VAGINAL | 12 refills | Status: DC

## 2018-09-29 NOTE — Telephone Encounter (Signed)
Gave patient avs and summary.  Called Lenise, Estate manager/land agent, and directed patient over to see her.

## 2018-09-30 ENCOUNTER — Other Ambulatory Visit: Payer: Self-pay | Admitting: *Deleted

## 2018-09-30 DIAGNOSIS — C50512 Malignant neoplasm of lower-outer quadrant of left female breast: Secondary | ICD-10-CM

## 2018-09-30 DIAGNOSIS — Z170 Estrogen receptor positive status [ER+]: Principal | ICD-10-CM

## 2018-12-09 ENCOUNTER — Other Ambulatory Visit: Payer: Self-pay | Admitting: Oncology

## 2019-03-09 ENCOUNTER — Other Ambulatory Visit: Payer: Self-pay | Admitting: Oncology

## 2019-08-28 ENCOUNTER — Other Ambulatory Visit: Payer: Self-pay | Admitting: Oncology

## 2019-09-07 ENCOUNTER — Telehealth: Payer: Self-pay | Admitting: Oncology

## 2019-09-07 NOTE — Telephone Encounter (Signed)
GM PAL 10/29 moved appointments from 10/29 to 11/13. Left message for patient. Schedule mailed.

## 2019-09-18 ENCOUNTER — Other Ambulatory Visit: Payer: Self-pay

## 2019-09-18 ENCOUNTER — Encounter: Payer: Self-pay | Admitting: Oncology

## 2019-09-18 DIAGNOSIS — Z20822 Contact with and (suspected) exposure to covid-19: Secondary | ICD-10-CM

## 2019-09-19 LAB — NOVEL CORONAVIRUS, NAA: SARS-CoV-2, NAA: NOT DETECTED

## 2019-10-05 ENCOUNTER — Ambulatory Visit: Payer: Commercial Managed Care - PPO | Admitting: Oncology

## 2019-10-05 ENCOUNTER — Other Ambulatory Visit: Payer: Commercial Managed Care - PPO

## 2019-10-19 ENCOUNTER — Other Ambulatory Visit: Payer: Self-pay

## 2019-10-19 DIAGNOSIS — C50512 Malignant neoplasm of lower-outer quadrant of left female breast: Secondary | ICD-10-CM

## 2019-10-19 NOTE — Progress Notes (Signed)
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   She continues on tamoxifen.  Sydney Cervantes tolerates it generally well.  She is having for some reason slightly increased hot flashes recently.  She is having some vaginal wetness issues as well.  Since her last visit, she underwent right screening mammography with tomography at [facility] on 08/30/2019 showing: breast density category B; no evidence of malignancy.  She also underwent coronavirus testing on 09/18/2019, which was negative.   REVIEW OF SYSTEMS: Sydney Cervantes tells me she never did start the Notus because the pharmacy wanted $400 for the prescriptions.  She would be interested in Estrace or similar if it were cheaper.  She has been having some unusual symptoms including numbness and 2 fingers and 2 toes.  This comes and goes.  She has seen her primary care physician for this with no obvious diagnosis but also it has not recurred in the last 2 or 3 months.  She has had no systemic symptoms including no evidence of an inflammatory arthritis, no ungual hemorrhages, no weight change, unexplained fatigue, rash, or fevers.  A detailed review of systems today was otherwise stable   BREAST CANCER HISTORY: From Dr. Rada Hay prior summary note:  1. 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  ER/PR positive at 63/5% respectively, HER-2 negative, elevated Ki-67. Completed 4/6 planned  neoadjuvant dose dense FEC with Neulasta support on day 2. 20% dose reduction on 5-FU, 50% dose reduction on epirubicin, 40% dose reduction on Cytoxan, after one-week delay due to anemia/thrombocytopenia/and region of probable localized thrombophlebitis versus mild chemotherapy extravasation on the left forearm, covered with Keflex 500 mg by mouth 3 times a day. Switched to single agent neoadjuvant Taxol, given 3 weeks on 1 week off, completed 4 cycles on 08/02/2012.   2. Status post left modified radical mastectomy on 08/25/2012 with final pathology showing no tumor in the primary tumor bed with 7 of 8 involved lymph nodes. The final pathology showed an ER positive tumor, HER-2 negative.  She completed radiation on 11/18/12 and started taking Tamoxifen on 11/21/2012.  It is also noted that she is BRCA negative.   Her subsequent history is as detailed below   PAST MEDICAL HISTORY: Past Medical History:  Diagnosis Date  . Anemia    "chemo induced" (08/25/2012)  . Breast cancer ([facility])    left  . Carcinoid tumor of colon, benign    partial colectomy ,1987  . GERD (gastroesophageal reflux disease)    occ; "side effect of chemo" (08/25/2012)  . Hepatic steatosis 12/27/2013  . Hx of colonoscopy 2000  . Hx of radiation therapy 09/21/12 -11/17/12   R supraclav fossa, L chest wall/SCVL fossa/PAB mid axilla/IM nodes/scar boost  . Seasonal allergies    "when molds and things come out; pollens" (08/25/2012)  . Wears glasses     PAST SURGICAL HISTORY: Past Surgical History:  Procedure Laterality Date  . APPENDECTOMY  1987  . BOWEL RESECTION  1987   Carcinoid Tumor of Appendix  .  BREAST BIOPSY  01/2012   left  . BREAST REDUCTION SURGERY Right 01/11/2014   Procedure: RIGHT BREAST REDUCTION ;  Surgeon: Crissie Reese, MD;  Location: [facility];  Service: Plastics;  Laterality: Right;  . CHOLECYSTECTOMY  2003  . COLONOSCOPY N/A 05/11/2014   Procedure: COLONOSCOPY;  Surgeon: Shann Medal, MD;  Location: [facility];  Service: General;  Laterality: N/A;  . DILATION AND CURETTAGE OF UTERUS  1992  . LATISSIMUS FLAP TO BREAST Left 01/11/2014   Procedure: LEFT LATISSIMUS FLAP TO BREAST WITH SALINE IMPLANT FOR RECONSTRUCTION;  Surgeon: Crissie Reese, MD;  Location: [facility];  Service: Plastics;  Laterality: Left;  Marland Kitchen MASTECTOMY MODIFIED RADICAL  08/25/2012   left  . PORTACATH PLACEMENT  02/11/2012   Procedure: INSERTION PORT-A-CATH;  Surgeon: Shann Medal, MD;  Location: [facility];  Service: General;  Laterality: Right;  Subclavian    FAMILY HISTORY Family History  Problem Relation Age of Onset  . Breast cancer Mother   . Breast cancer Paternal Aunt   . Cancer Maternal Grandmother        Hodgkins Disease    GYNECOLOGIC HISTORY:   G3P2.  Menarche at 58 years of age.  Followed by Sydney Coder, NP.     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 Scott works in Engineer, technical sales for [employer]. Daughter Joellen Jersey currently 39 years old is a Environmental education officer in psychology and exercise physiology hoping to become an occupational therapist. Daughter Raquel Sarna studied Therapist, occupational at [employer]	[place] and hopes to be in [place] ; she is currently living at home with the patient. Daughter Burman Nieves is currently in [place] History.   ADVANCED DIRECTIVES: In the absence of any documentation to the contrary, the patient's spouse is their HCPOA.    HEALTH MAINTENANCE:   Social History   Socioeconomic History  . Marital status: Married    Spouse name: Not on file  . Number of children: Not on file  . Years of education: Not on file  . Highest education level: Not on file  Occupational History  . Not on file  Social Needs  . Financial resource strain: Not on file  . Food insecurity    Worry: Not on file    Inability: Not on file  . Transportation  needs    Medical: Not on file    Non-medical: Not on file  Tobacco Use  . Smoking status: Never Smoker  . Smokeless tobacco: Never Used  Substance and Sexual Activity  . Alcohol use: Yes    Alcohol/week: 7.0 - 10.0 standard drinks    Types: 7 - 10 Glasses of wine per week    Comment: 06/26/2013 - 1-1.5 glasses of wine nightly  . Drug use: No  . Sexual activity: Yes  Lifestyle  . Physical activity    Days per week: Not on file    Minutes per session: Not on file  . Stress: Not on file  Relationships  . Social Herbalist on phone: Not on file    Gets together: Not on file    Attends religious service: Not on file    Active member of club or organization: Not on file    Attends meetings of clubs or organizations: Not on file    Relationship status: Not on file  . Intimate partner violence  Fear of current or ex partner: Not on file    Emotionally abused: Not on file    Physically abused: Not on file    Forced sexual activity: Not on file  Other Topics Concern  . Not on file  Social History Narrative  . Not on file     COLONOSCOPY: Approx 2000  PAP:  Not on file  BONE DENSITY:   January 2016  LIPIDS:  Not on file    Allergies  Allergen Reactions  . Adhesive [Tape] Itching and Rash    Rash and itching at steristrip and EKG lead pad sites  . Codeine     Headache    Current Outpatient Medications  Medication Sig Dispense Refill  . Calcium Carbonate-Vitamin D (CALTRATE 600+D PO) Take 1 tablet by mouth daily.     . cholecalciferol (VITAMIN D) 1000 units tablet Take 1 tablet (1,000 Units total) by mouth daily.    . Cyanocobalamin (B-12) 1000 MCG CAPS Take by mouth.    . estradiol (ESTRACE VAGINAL) 0.1 MG/GM vaginal cream Place 1 Applicatorful vaginally at bedtime. 42.5 g 12  . Probiotic Product (PROBIOTIC DAILY PO) Take 2 capsules by mouth daily.    . tamoxifen (NOLVADEX) 20 MG tablet Take 1 tablet (20 mg total) by mouth daily. 90 tablet 3   No current  facility-administered medications for this visit.    OBJECTIVE: Middle-aged white woman who appears well  Vitals:   10/20/19 1007  BP: 137/83  Pulse: 89  Resp: 18  Temp: 98.8 F (37.1 C)  SpO2: 100%   Body mass index is 23.43 kg/m. ECOG FS: 0 - Asymptomatic  Sclerae unicteric, EOMs intact Wearing a mask No cervical or supraclavicular adenopathy Lungs no rales or rhonchi Heart regular rate and rhythm Abd soft, nontender, positive bowel sounds MSK no focal spinal tenderness, minimal left upper extremity lymphedema with sleeve in place Neuro: nonfocal, well oriented, appropriate affect Breasts: On the right she has undergone reduction mammoplasty.  This is otherwise unremarkable.  On the left she is status post mastectomy with saline implant reconstruction.  There is no evidence of recurrence.  Both axillae are benign.   LAB RESULTS:  CBC    Component Value Date/Time   WBC 4.5 10/20/2019 0904   WBC 4.4 09/29/2017 1009   WBC 5.4 01/02/2014 1337   RBC 4.11 10/20/2019 0904   HGB 13.6 10/20/2019 0904   HGB 14.4 09/29/2017 1009   HCT 41.7 10/20/2019 0904   HCT 43.4 09/29/2017 1009   PLT 144 (L) 10/20/2019 0904   PLT 133 (L) 09/29/2017 1009   MCV 101.5 (H) 10/20/2019 0904   MCV 99.1 09/29/2017 1009   MCH 33.1 10/20/2019 0904   MCHC 32.6 10/20/2019 0904   RDW 13.1 10/20/2019 0904   RDW 13.4 09/29/2017 1009   LYMPHSABS 1.4 10/20/2019 0904   LYMPHSABS 1.3 09/29/2017 1009   MONOABS 0.6 10/20/2019 0904   MONOABS 0.5 09/29/2017 1009   EOSABS 0.3 10/20/2019 0904   EOSABS 0.2 09/29/2017 1009   BASOSABS 0.0 10/20/2019 0904   BASOSABS 0.0 09/29/2017 1009   CMP Latest Ref Rng & Units 10/20/2019 09/29/2018 09/29/2017  Glucose 70 - 99 mg/dL 95 97 98  BUN 6 - 20 mg/dL 15 15 13.7  Creatinine 0.44 - 1.00 mg/dL 0.85 0.83 0.8  Sodium 135 - 145 mmol/L 141 141 141  Potassium 3.5 - 5.1 mmol/L 4.5 4.1 3.7  Chloride 98 - 111 mmol/L 104 107 -  CO2 22 - 32 mmol/L 26  21(L) 23  Calcium  8.9 - 10.3 mg/dL 9.5 9.3 9.1  Total Protein 6.5 - 8.1 g/dL 6.5 6.6 6.3(L)  Total Bilirubin 0.3 - 1.2 mg/dL 0.4 0.5 0.58  Alkaline Phos 38 - 126 U/L 83 80 67  AST 15 - 41 U/L 54(H) 30 39(H)  ALT 0 - 44 U/L 72(H) 30 40     STUDIES: No results found.   ASSESSMENT:  58 y.o. BRCA negative [place] woman  (1) s/p left breast lower outer quadrant and left axillary lymph node biopsies 01/18/2012 for a clinical T4 N3, stage IIIC invasive lobular carcinoma, grade 3, estrogen receptor 62% positive, progesterone receptor 5% positive, with an MIB-1 of 33% and no HER-2 amplification.  (2) Began chemotherapy in March 2013, and received 4/6 planned neoadjuvant cycles of dose dense cyclophosphamide, epirubicin and fluorouracil followed by weekly paclitaxel x 12 completed Taxol 08/02/2012.   (3) left modified radical mastectomy 08/25/2012 showed no residual tumor in the breast, but 7/8 sampled lymph nodes were positive, 6 with macro metastases. Repeat HER-2 was again negative.  (4) She completed radiation on 11/18/12   (5) started tamoxifen on 11/21/2012  (a) opted against switching to aromatase inhibitors  (6) on 01/11/2014 underwent left latissimus dorsi myocutaneous flap reconstruction of the left breast, with saline implant, and right breast mammoplasty for symmetry  (7) history of a cecal carcinoid resected 1987, with an unremarkable colonoscopy 05/14/2014  (8) hepatic steatosis, with occasional AST elevation: no trend noted  (9) osteopenia -   (a) T-score   -1.9 on dexa  12/21/14 at [facility]  (b) repeat bone density 08/24/2018 T score -1.8   PLAN: Naryah is now 7 years out from definitive surgery for her breast cancer with no evidence of disease recurrence.  This is very favorable.  She continues on tamoxifen, generally with good tolerance, and the plan is to continue that a total of 10 years.  We are going to try Estrace since she was not able to afford Estring.  She will let me know if  there is economic or symptomatic problems from that.  She understands that while she is on tamoxifen I feel comfortable prescribing that but of course once she is off tamoxifen I will recommend discontinuation.  I do not have a simple explanation for the very unusual symptoms she had in some of her fingers and toes.  Luckily that has not recurred.  She is keeping appropriate pandemic precautions.  She knows to call for any other issue that may develop before her next visit, which will be in a year.  Magrinat, Virgie Dad, MD  10/22/19 10:14 AM Medical Oncology and Hematology [facility] [place], [place] [address] Tel. [phone]    Fax. [phone]   This document serves as a record of services personally performed by Lurline Del, MD. It was created on his behalf by Wilburn Mylar, a trained medical scribe. The creation of this record is based on the scribe's personal observations and the provider's statements to them.   I, Lurline Del MD, have reviewed the above documentation for accuracy and completeness, and I agree with the above.

## 2019-10-20 ENCOUNTER — Inpatient Hospital Stay: Payer: Commercial Managed Care - PPO | Attending: Oncology

## 2019-10-20 ENCOUNTER — Other Ambulatory Visit: Payer: Self-pay

## 2019-10-20 ENCOUNTER — Inpatient Hospital Stay (HOSPITAL_BASED_OUTPATIENT_CLINIC_OR_DEPARTMENT_OTHER): Payer: Commercial Managed Care - PPO | Admitting: Oncology

## 2019-10-20 VITALS — BP 137/83 | HR 89 | Temp 98.8°F | Resp 18 | Ht 68.0 in | Wt 154.1 lb

## 2019-10-20 DIAGNOSIS — M8580 Other specified disorders of bone density and structure, unspecified site: Secondary | ICD-10-CM | POA: Diagnosis not present

## 2019-10-20 DIAGNOSIS — Z79899 Other long term (current) drug therapy: Secondary | ICD-10-CM | POA: Diagnosis not present

## 2019-10-20 DIAGNOSIS — C50512 Malignant neoplasm of lower-outer quadrant of left female breast: Secondary | ICD-10-CM | POA: Diagnosis present

## 2019-10-20 DIAGNOSIS — K219 Gastro-esophageal reflux disease without esophagitis: Secondary | ICD-10-CM | POA: Diagnosis not present

## 2019-10-20 DIAGNOSIS — K760 Fatty (change of) liver, not elsewhere classified: Secondary | ICD-10-CM | POA: Diagnosis not present

## 2019-10-20 DIAGNOSIS — Z79810 Long term (current) use of selective estrogen receptor modulators (SERMs): Secondary | ICD-10-CM | POA: Insufficient documentation

## 2019-10-20 DIAGNOSIS — Z923 Personal history of irradiation: Secondary | ICD-10-CM | POA: Diagnosis not present

## 2019-10-20 DIAGNOSIS — Z803 Family history of malignant neoplasm of breast: Secondary | ICD-10-CM | POA: Insufficient documentation

## 2019-10-20 DIAGNOSIS — Z170 Estrogen receptor positive status [ER+]: Secondary | ICD-10-CM | POA: Insufficient documentation

## 2019-10-20 DIAGNOSIS — Z9012 Acquired absence of left breast and nipple: Secondary | ICD-10-CM | POA: Diagnosis not present

## 2019-10-20 LAB — CBC WITH DIFFERENTIAL (CANCER CENTER ONLY)
Abs Immature Granulocytes: 0.01 10*3/uL (ref 0.00–0.07)
Basophils Absolute: 0 10*3/uL (ref 0.0–0.1)
Basophils Relative: 1 %
Eosinophils Absolute: 0.3 10*3/uL (ref 0.0–0.5)
Eosinophils Relative: 8 %
HCT: 41.7 % (ref 36.0–46.0)
Hemoglobin: 13.6 g/dL (ref 12.0–15.0)
Immature Granulocytes: 0 %
Lymphocytes Relative: 32 %
Lymphs Abs: 1.4 10*3/uL (ref 0.7–4.0)
MCH: 33.1 pg (ref 26.0–34.0)
MCHC: 32.6 g/dL (ref 30.0–36.0)
MCV: 101.5 fL — ABNORMAL HIGH (ref 80.0–100.0)
Monocytes Absolute: 0.6 10*3/uL (ref 0.1–1.0)
Monocytes Relative: 13 %
Neutro Abs: 2.1 10*3/uL (ref 1.7–7.7)
Neutrophils Relative %: 46 %
Platelet Count: 144 10*3/uL — ABNORMAL LOW (ref 150–400)
RBC: 4.11 MIL/uL (ref 3.87–5.11)
RDW: 13.1 % (ref 11.5–15.5)
WBC Count: 4.5 10*3/uL (ref 4.0–10.5)
nRBC: 0 % (ref 0.0–0.2)

## 2019-10-20 LAB — CMP (CANCER CENTER ONLY)
ALT: 72 U/L — ABNORMAL HIGH (ref 0–44)
AST: 54 U/L — ABNORMAL HIGH (ref 15–41)
Albumin: 4 g/dL (ref 3.5–5.0)
Alkaline Phosphatase: 83 U/L (ref 38–126)
Anion gap: 11 (ref 5–15)
BUN: 15 mg/dL (ref 6–20)
CO2: 26 mmol/L (ref 22–32)
Calcium: 9.5 mg/dL (ref 8.9–10.3)
Chloride: 104 mmol/L (ref 98–111)
Creatinine: 0.85 mg/dL (ref 0.44–1.00)
GFR, Est AFR Am: >60 mL/min (ref >60)
GFR, Estimated: >60 mL/min (ref >60)
Glucose, Bld: 95 mg/dL (ref 70–99)
Potassium: 4.5 mmol/L (ref 3.5–5.1)
Sodium: 141 mmol/L (ref 135–145)
Total Bilirubin: 0.4 mg/dL (ref 0.3–1.2)
Total Protein: 6.5 g/dL (ref 6.5–8.1)

## 2019-10-20 MED ORDER — TAMOXIFEN CITRATE 20 MG PO TABS: 20.0000 mg | ORAL_TABLET | Freq: Every day | ORAL | 3 refills | Status: DC

## 2019-10-20 MED ORDER — ESTRADIOL 0.1 MG/GM VA CREA: 1.0000 | TOPICAL_CREAM | Freq: Every day | VAGINAL | 12 refills | Status: DC

## 2019-10-23 ENCOUNTER — Ambulatory Visit: Payer: Commercial Managed Care - PPO | Attending: Oncology

## 2019-10-23 ENCOUNTER — Telehealth: Payer: Self-pay | Admitting: Oncology

## 2019-10-23 ENCOUNTER — Other Ambulatory Visit: Payer: Self-pay

## 2019-10-23 DIAGNOSIS — M25612 Stiffness of left shoulder, not elsewhere classified: Secondary | ICD-10-CM | POA: Diagnosis present

## 2019-10-23 DIAGNOSIS — I972 Postmastectomy lymphedema syndrome: Secondary | ICD-10-CM | POA: Diagnosis present

## 2019-10-23 DIAGNOSIS — R293 Abnormal posture: Secondary | ICD-10-CM | POA: Insufficient documentation

## 2019-10-23 DIAGNOSIS — Z170 Estrogen receptor positive status [ER+]: Secondary | ICD-10-CM | POA: Diagnosis present

## 2019-10-23 DIAGNOSIS — C50512 Malignant neoplasm of lower-outer quadrant of left female breast: Secondary | ICD-10-CM | POA: Insufficient documentation

## 2019-10-23 NOTE — Patient Instructions (Signed)
Access Code: [email or web address]  URL: [email or web address]  Date: 10/23/2019  Prepared by: Tomma Rakers   Exercises Shoulder Flexion Wall Slide with Towel - 10 reps - 1 sets - 5 secondhold at the top pain-free. hold - 1-2x daily - 7x weekly Shoulder Internal Rotation with Resistance - 10 reps - 1 sets - 1-2x daily - 7x weekly Single Arm Shoulder Extension with Anchored Resistance - 10 reps - 1 sets - 1-2x daily - 7x weekly Standing Shoulder External Rotation with Resistance - 10 reps - 1 sets - 1-2x daily - 7x weekly

## 2019-10-23 NOTE — Telephone Encounter (Signed)
I talk with patient regarding schedule  

## 2019-10-23 NOTE — Therapy (Signed)
[place], [place], [address] Phone: [phone]   Fax:  [phone]  Physical Therapy Evaluation  Patient Details  Name: Sydney Cervantes MRN: [id number] Date of Birth: 1961/07/27 Referring Provider (PT): Lurline Del MD   Encounter Date: 10/23/2019  PT End of Session - 10/23/19 1657    Visit Number  1    Number of Visits  5    Date for PT Re-Evaluation  11/27/19    PT Start Time  0964    PT Stop Time  1658    PT Time Calculation (min)  44 min    Activity Tolerance  Patient tolerated treatment well    Behavior During Therapy  [facility] for tasks assessed/performed       Past Medical History:  Diagnosis Date  . Anemia    "chemo induced" (08/25/2012)  . Breast cancer ([place])    left  . Carcinoid tumor of colon, benign    partial colectomy ,1987  . GERD (gastroesophageal reflux disease)    occ; "side effect of chemo" (08/25/2012)  . Hepatic steatosis 12/27/2013  . Hx of colonoscopy 2000  . Hx of radiation therapy 09/21/12 -11/17/12   R supraclav fossa, L chest wall/SCVL fossa/PAB mid axilla/IM nodes/scar boost  . Seasonal allergies    "when molds and things come out; pollens" (08/25/2012)  . Wears glasses     Past Surgical History:  Procedure Laterality Date  . APPENDECTOMY  1987  . BOWEL RESECTION  1987   Carcinoid Tumor of Appendix  . BREAST BIOPSY  01/2012   left  . BREAST REDUCTION SURGERY Right 01/11/2014   Procedure: RIGHT BREAST REDUCTION ;  Surgeon: Crissie Reese, MD;  Location: [place];  Service: Plastics;  Laterality: Right;  . CHOLECYSTECTOMY  2003  . COLONOSCOPY N/A 05/11/2014   Procedure: COLONOSCOPY;  Surgeon: Shann Medal, MD;  Location[facility];  Service: General;  Laterality: N/A;  . DILATION AND CURETTAGE OF UTERUS  1992  . LATISSIMUS FLAP TO BREAST Left 01/11/2014   Procedure: LEFT LATISSIMUS FLAP TO BREAST WITH SALINE IMPLANT FOR RECONSTRUCTION;  Surgeon: Crissie Reese, MD;   Location: [facility];  Service: Plastics;  Laterality: Left;  Marland Kitchen MASTECTOMY MODIFIED RADICAL  08/25/2012   left  . PORTACATH PLACEMENT  02/11/2012   Procedure: INSERTION PORT-A-CATH;  Surgeon: Shann Medal, MD;  Location: [facility];  Service: General;  Laterality: Right;  Subclavian    There were no vitals filed for this visit.   Subjective Assessment - 10/23/19 1615    Subjective  Pt reports that she feels tight in her LUE and down into her shoulder. She currently has a sleeve that she wears all the time and she has a night sleeve. Pt states that her night sleeve is getting really old and she is in dire need of a new one. She states that she has been doing the lymphatic massage at home but is continuing to feel fluid in her shoulder blade area and LUE.    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  ER/PR positive at 63/5% respectively, HER-2 negative, elevated Ki-67.    Patient Stated Goals  I want to reduce my swelling and try to eliminate the need the need for compression.    Currently in Pain?  No/denies         [facility] PT Assessment - 10/23/19 0001  Assessment   Medical Diagnosis  L breast cancer    Referring Provider (PT)  Lurline Del MD    Hand Dominance  Right    Next MD Visit  10/22/2020    Prior Therapy  yes back in 2015      Balance Screen   Has the patient fallen in the past 6 months  Yes    How many times?  1    Has the patient had a decrease in activity level because of a fear of falling?   No    Is the patient reluctant to leave their home because of a fear of falling?   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, shipping, binding    Leisure  walking, reading, yoga      Cognition   Overall Cognitive Status  Within Functional Limits for tasks assessed      Posture/Postural Control   Posture/Postural Control  Postural limitations    Postural Limitations  Rounded Shoulders;Forward head      ROM / Strength   AROM / PROM / Strength  AROM      AROM   AROM Assessment Site  Shoulder    Right/Left Shoulder  Right;Left    Right Shoulder Flexion  163 Degrees    Right Shoulder ABduction  171 Degrees    Right Shoulder Internal Rotation  84 Degrees    Right Shoulder External Rotation  66 Degrees    Left Shoulder Flexion  146 Degrees    Left Shoulder ABduction  168 Degrees    Left Shoulder Internal Rotation  73 Degrees    Left Shoulder External Rotation  62 Degrees        LYMPHEDEMA/ONCOLOGY QUESTIONNAIRE - 10/23/19 1638      Right Upper Extremity Lymphedema   15 cm Proximal to Olecranon Process  29 cm    10 cm Proximal to Olecranon Process  26.9 cm    Olecranon Process  23 cm    15 cm Proximal to Ulnar Styloid Process  22.4 cm    10 cm Proximal to Ulnar Styloid Process  18.3 cm    Just Proximal to Ulnar Styloid Process  14.7 cm    Across Hand at [employer]  17.9 cm    At [place] of 2nd Digit  5.7 cm      Left Upper Extremity Lymphedema   15 cm Proximal to Olecranon Process  29.9 cm    10 cm Proximal to Olecranon Process  28.8 cm    Olecranon Process  23.8 cm    15 cm Proximal to Ulnar Styloid Process  25.6 cm    10 cm Proximal to Ulnar Styloid Process  22.8 cm    Just Proximal to Ulnar Styloid Process  14.8 cm    Across Hand at [employer]  17.3 cm    At [place] of 2nd Digit  5.6 cm             Outpatient Re[facility]23/[facility]  Lymphedema Life Impact Scale Total Score  19.12 %      Objective measurements completed on examination: See above findings.  [facility] Adult PT Treatment/Exercise - 10/23/19 0001       Exercises   Exercises  Shoulder      Shoulder Exercises: Standing   External Rotation  Strengthening;Both;10 reps    Theraband Level (Shoulder External Rotation)  Level 1 (Yellow)    External Rotation Limitations  VC to keep elbows tucked to prevent compensation with abd with tactile cueing and to stand up straight.     Internal Rotation  Strengthening;Left;10 reps    Theraband Level (Shoulder Internal Rotation)  Level 1 (Yellow)    Internal Rotation Limitations  VC/tactile cueing to keep the elbow tucked throughout movement following demonstration.     Flexion  AAROM;Left;10 reps    Flexion Limitations  5 second hold with tactile cueing to prevent R side bending compensation.     Extension  Strengthening;Left;10 reps    Theraband Level (Shoulder Extension)  Level 1 (Yellow)    Extension Limitations  demonstration with VC to keep the L elbow straight and thumb pointing frontal to prevent compensation             PT Education - 10/23/19 1650    Education Details  Access Code: [email or web address], tactile, demonstration and VC for correct form with ther-ex. Discussed options as far as night garments with patient and strengthening L scapula to decrease stress as well as easy stretching for ROM of flexion for the L shoulder.    Person(s) Educated  Patient    Methods  Explanation;Demonstration;Tactile cues;Verbal cues;Handout    Comprehension  Verbalized understanding;Returned demonstration       PT Short Term Goals - 10/23/19 1705      PT SHORT TERM GOAL #1   Title  Pt will be independent with initial HEP in order to promote autonomy of care for scapular stabilization exercises.    Baseline  Pt is performing yoga but nothing to specifically target her L shoulder/scapular area    Time  2    Period  Weeks    Status  New    Target Date  11/13/19        PT Long Term Goals - 10/23/19 1706      PT LONG TERM GOAL #1   Title  Pt will demonstrate improved ROM in the L shoulder to 160  degrees shoulder flexion to demonstrate improved functional ROM.    Baseline  L shoulder fleixon 146    Time  4    Period  Weeks    Status  New    Target Date  11/27/19      PT LONG TERM GOAL #2   Title  Pt will be able to demonstrate improve posture when asked in sitting to decrease fwd head posture and rounded shoulders demonstrating increased activation at Bil shoulder blade w/o cueing for correct position.    Baseline  Pt demonstrates fwd head posture and anteriorly rolled shoulders.    Time  4    Period  Weeks    Status  New    Target Date  11/27/19      PT LONG TERM GOAL #3   Title  Patient to be properly fitted with compression night garment and bra to wear on daily basis.    Baseline  Pt is not wearing a compressive bra and has a very old night garment that she has to prop her arm up with a pillow to decrease stress on the shoulder.    Time  4    Period  Weeks  Status  New    Target Date  11/27/19      PT LONG TERM GOAL #4   Title  Patient will have reduction of limb girth by 1 cm circumferential measurements in the distal brachium, proximal antebrachium of the LUE    Baseline  see measurements    Time  4    Period  Weeks    Status  New    Target Date  11/27/19             Plan - 10/23/19 1658    Clinical Impression Statement  Pt presents to physical therapy with slight increase in circumferential measurements in her L UE compared to her RUE especially in the distal brachium, proximal antebrachium. She reports occasional pain in her L scapular area especially with increased activity. She has decreased ROM in her L shoulder flexion when compared to her R shoulder flexion. She has been treating her lymphedema at home for about 5 years but is in need of new garments and is not reducing with MLD demonstration need for vasopneumatic pump. Pt will benefit from skilled physical therapy 1x/week for 4 weeks by pt preference in order to address the above limitationsand get her  the equipment that she needs to manage lymphedema at home.    Stability/Clinical Decision Making  Stable/Uncomplicated    Clinical Decision Making  Low    Rehab Potential  Excellent    PT Frequency  1x / week    PT Duration  4 weeks    PT Treatment/Interventions  ADLs/Self Care Home Management;Electrical Stimulation;Cryotherapy;Iontophoresis 42m/ml Dexamethasone;Moist Heat;Functional mobility training;Therapeutic activities;Therapeutic exercise;Neuromuscular re-education;Patient/family education;Manual lymph drainage;Compression bandaging;Passive range of motion;Manual techniques;Taping    PT Next Visit Plan  MLD for the LUQ and self MLD. Check for script. Assess HEP and progress scapular stablization exercises.    PT Home Exercise Plan  [facility]   Recommended Other Services  Tactile medical, compression sleeve/bra/night garment with [facility]    Consulted and Agree with Plan of Care  Patient       Patient will benefit from skilled therapeutic intervention in order to improve the following deficits and impairments:  Decreased range of motion, Postural dysfunction, Increased edema  Visit Diagnosis: Postmastectomy lymphedema  Malignant neoplasm of lower-outer quadrant of left breast of female, estrogen receptor positive (HCC)  Stiffness of left shoulder, not elsewhere classified  Abnormal posture     Problem List Patient Active Problem List   Diagnosis Date Noted  . Osteopenia 01/17/2015  . Hepatic steatosis 12/27/2013  . Malignant neoplasm of lower-outer quadrant of left breast of female, estrogen receptor positive ([place] 09/26/2013  . Elevated liver enzymes 06/26/2013  . Wears glasses   . Seasonal allergies   . Hx of radiation therapy     CAnder Purpura PT 10/23/2019, 5:13 PM  [place] [address]Phone: [phone]  Fax:  [phone] Name: Sydney MCCULLARSMRN:  [id number] of Birth: [birth year]

## 2019-11-06 ENCOUNTER — Ambulatory Visit: Payer: Commercial Managed Care - PPO | Admitting: Rehabilitation

## 2019-11-06 ENCOUNTER — Other Ambulatory Visit: Payer: Self-pay

## 2019-11-06 ENCOUNTER — Encounter: Payer: Self-pay | Admitting: Rehabilitation

## 2019-11-06 DIAGNOSIS — Z170 Estrogen receptor positive status [ER+]: Secondary | ICD-10-CM

## 2019-11-06 DIAGNOSIS — M25612 Stiffness of left shoulder, not elsewhere classified: Secondary | ICD-10-CM

## 2019-11-06 DIAGNOSIS — I972 Postmastectomy lymphedema syndrome: Secondary | ICD-10-CM

## 2019-11-06 DIAGNOSIS — R293 Abnormal posture: Secondary | ICD-10-CM

## 2019-11-06 DIAGNOSIS — C50512 Malignant neoplasm of lower-outer quadrant of left female breast: Secondary | ICD-10-CM

## 2019-11-06 NOTE — Therapy (Signed)
[place], [place], [address] Phone: [phone]   Fax:  [phone]  Physical Therapy Treatment  Patient Details  Name: Sydney Cervantes MRN: [id number] Date of Birth: 17-Jan-1961 Referring Provider (PT): Lurline Del MD   Encounter Date: 11/06/2019  PT End of Session - 11/06/19 1459    Visit Number  2    Number of Visits  5    Date for PT Re-Evaluation  11/27/19    PT Start Time  1572    PT Stop Time  1452    PT Time Calculation (min)  45 min    Activity Tolerance  Patient tolerated treatment well    Behavior During Therapy  [facility] for tasks assessed/performed       Past Medical History:  Diagnosis Date  . Anemia    "chemo induced" (08/25/2012)  . Breast cancer ([place])    left  . Carcinoid tumor of colon, benign    partial colectomy ,1987  . GERD (gastroesophageal reflux disease)    occ; "side effect of chemo" (08/25/2012)  . Hepatic steatosis 12/27/2013  . Hx of colonoscopy 2000  . Hx of radiation therapy 09/21/12 -11/17/12   R supraclav fossa, L chest wall/SCVL fossa/PAB mid axilla/IM nodes/scar boost  . Seasonal allergies    "when molds and things come out; pollens" (08/25/2012)  . Wears glasses     Past Surgical History:  Procedure Laterality Date  . APPENDECTOMY  1987  . BOWEL RESECTION  1987   Carcinoid Tumor of Appendix  . BREAST BIOPSY  01/2012   left  . BREAST REDUCTION SURGERY Right 01/11/2014   Procedure: RIGHT BREAST REDUCTION ;  Surgeon: Crissie Reese, MD;  Location: [facility];  Service: Plastics;  Laterality: Right;  . CHOLECYSTECTOMY  2003  . COLONOSCOPY N/A 05/11/2014   Procedure: COLONOSCOPY;  Surgeon: Shann Medal, MD;  Location[facility];  Service: General;  Laterality: N/A;  . DILATION AND CURETTAGE OF UTERUS  1992  . LATISSIMUS FLAP TO BREAST Left 01/11/2014   Procedure: LEFT LATISSIMUS FLAP TO BREAST WITH SALINE IMPLANT FOR RECONSTRUCTION;  Surgeon: Crissie Reese, MD;   Location: [place];  Service: Plastics;  Laterality: Left;  Marland Kitchen MASTECTOMY MODIFIED RADICAL  08/25/2012   left  . PORTACATH PLACEMENT  02/11/2012   Procedure: INSERTION PORT-A-CATH;  Surgeon: Shann Medal, MD;  Location: [facility];  Service: General;  Laterality: Right;  Subclavian    There were no vitals filed for this visit.  Subjective Assessment - 11/06/19 1408    Subjective  Derek already came out for the pump.  Waiting for the prescription and then order it.  It will go to my house.    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  ER/PR positive at 63/5% respectively, HER-2 negative, elevated Ki-67.    Currently in Pain?  No/denies                  Outpatient Re[facility] from [facility]23/2019 [facility]  Lymphedema Life Impact Scale Total Score  19.12 %           [facility] Adult PT Treatment/Exercise - 11/06/19 0001      Manual Therapy   Manual Therapy  Edema management;Manual Lymphatic Drainage (MLD)    Manual therapy comments  gave pt script and instructions for second to nature also suggested cami or tshirt for  compression as well as bras. scheduled pt for 12/14 wiht melissa and changed her appt time to match.    Manual Lymphatic Drainage (MLD)  in supine: short neck, superficial and deep abdominals, Rt axillary nodes, Lt axillary nodes, anterior interaxillary pathway, left inguinal nodes and left axillo inuinal pathway then Lt UE from shoulder to dorsum of the hand and reversing all steps.  Then posterior interaxillary work and focus around lat incision and shoulder blade with the most puffiness here.  Discussed using long handled sponge but pt arlready doing this at home.                 PT Short Term Goals - 10/23/19 1705      PT SHORT TERM GOAL #1   Title  Pt will be independent with initial HEP in order to promote  autonomy of care for scapular stabilization exercises.    Baseline  Pt is performing yoga but nothing to specifically target her L shoulder/scapular area    Time  2    Period  Weeks    Status  New    Target Date  11/13/19        PT Long Term Goals - 10/23/19 1706      PT LONG TERM GOAL #1   Title  Pt will demonstrate improved ROM in the L shoulder to 160 degrees shoulder flexion to demonstrate improved functional ROM.    Baseline  L shoulder fleixon 146    Time  4    Period  Weeks    Status  New    Target Date  11/27/19      PT LONG TERM GOAL #2   Title  Pt will be able to demonstrate improve posture when asked in sitting to decrease fwd head posture and rounded shoulders demonstrating increased activation at Bil shoulder blade w/o cueing for correct position.    Baseline  Pt demonstrates fwd head posture and anteriorly rolled shoulders.    Time  4    Period  Weeks    Status  New    Target Date  11/27/19      PT LONG TERM GOAL #3   Title  Patient to be properly fitted with compression night garment and bra to wear on daily basis.    Baseline  Pt is not wearing a compressive bra and has a very old night garment that she has to prop her arm up with a pillow to decrease stress on the shoulder.    Time  4    Period  Weeks    Status  New    Target Date  11/27/19      PT LONG TERM GOAL #4   Title  Patient will have reduction of limb girth by 1 cm circumferential measurements in the distal brachium, proximal antebrachium of the LUE    Baseline  see measurements    Time  4    Period  Weeks    Status  New    Target Date  11/27/19            Plan - 11/06/19 1459    Clinical Impression Statement  Began left UE and especially left scapular region MLD with most puffines posteriorly.  Pt reports everything feels lighter and more free post treatment.  Pump process is now beginning and pt is set up to be measured for all new garments.  Pt had no questions regarding HEP for now.     PT Frequency  1x /  week    PT Duration  4 weeks    PT Treatment/Interventions  ADLs/Self Care Home Management;Electrical Stimulation;Cryotherapy;Iontophoresis 18m/ml Dexamethasone;Moist Heat;Functional mobility training;Therapeutic activities;Therapeutic exercise;Neuromuscular re-education;Patient/family education;Manual lymph drainage;Compression bandaging;Passive range of motion;Manual techniques;Taping    PT Next Visit Plan  MLD for the LUQ and self MLD. Assess HEP and progress scapular stablization exercises. taping for shoulder blade region?    PT Home Exercise Plan  [facility]      Patient will benefit from skilled therapeutic intervention in order to improve the following deficits and impairments:     Visit Diagnosis: Postmastectomy lymphedema  Malignant neoplasm of lower-outer quadrant of left breast of female, estrogen receptor positive (HCC)  Stiffness of left shoulder, not elsewhere classified  Abnormal posture     Problem List Patient Active Problem List   Diagnosis Date Noted  . Osteopenia 01/17/2015  . Hepatic steatosis 12/27/2013  . Malignant neoplasm of lower-outer quadrant of left breast of female, estrogen receptor positive ([place] 09/26/2013  . Elevated liver enzymes 06/26/2013  . Wears glasses   . Seasonal allergies   . Hx of radiation therapy     TStark Bray11/30/2020, 3:01 PM  [place] [address]Phone: [phone]  Fax:  [phone] Name: TMALKY RUDZINSKIMRN: [id number] of Birth: [birth year]

## 2019-11-13 ENCOUNTER — Ambulatory Visit: Payer: Commercial Managed Care - PPO | Attending: Oncology

## 2019-11-13 ENCOUNTER — Other Ambulatory Visit: Payer: Self-pay

## 2019-11-13 DIAGNOSIS — M25612 Stiffness of left shoulder, not elsewhere classified: Secondary | ICD-10-CM | POA: Insufficient documentation

## 2019-11-13 DIAGNOSIS — I972 Postmastectomy lymphedema syndrome: Secondary | ICD-10-CM | POA: Insufficient documentation

## 2019-11-13 DIAGNOSIS — C50512 Malignant neoplasm of lower-outer quadrant of left female breast: Secondary | ICD-10-CM | POA: Insufficient documentation

## 2019-11-13 DIAGNOSIS — Z170 Estrogen receptor positive status [ER+]: Secondary | ICD-10-CM | POA: Diagnosis present

## 2019-11-13 DIAGNOSIS — R293 Abnormal posture: Secondary | ICD-10-CM | POA: Diagnosis present

## 2019-11-13 NOTE — Therapy (Signed)
[place], [place], [address] Phone: [phone]   Fax:  [phone]  Physical Therapy Treatment  Patient Details  Name: Sydney Cervantes MRN: [id number] Date of Birth: 1961/06/02 Referring Provider (PT): Lurline Del MD   Encounter Date: 11/13/2019  PT End of Session - 11/13/19 1408    Visit Number  3    Number of Visits  5    Date for PT Re-Evaluation  11/27/19    PT Start Time  4854    PT Stop Time  1500    PT Time Calculation (min)  53 min    Activity Tolerance  Patient tolerated treatment well    Behavior During Therapy  [facility] for tasks assessed/performed       Past Medical History:  Diagnosis Date  . Anemia    "chemo induced" (08/25/2012)  . Breast cancer ([place])    left  . Carcinoid tumor of colon, benign    partial colectomy ,1987  . GERD (gastroesophageal reflux disease)    occ; "side effect of chemo" (08/25/2012)  . Hepatic steatosis 12/27/2013  . Hx of colonoscopy 2000  . Hx of radiation therapy 09/21/12 -11/17/12   R supraclav fossa, L chest wall/SCVL fossa/PAB mid axilla/IM nodes/scar boost  . Seasonal allergies    "when molds and things come out; pollens" (08/25/2012)  . Wears glasses     Past Surgical History:  Procedure Laterality Date  . APPENDECTOMY  1987  . BOWEL RESECTION  1987   Carcinoid Tumor of Appendix  . BREAST BIOPSY  01/2012   left  . BREAST REDUCTION SURGERY Right 01/11/2014   Procedure: RIGHT BREAST REDUCTION ;  Surgeon: Crissie Reese, MD;  Location: [place];  Service: Plastics;  Laterality: Right;  . CHOLECYSTECTOMY  2003  . COLONOSCOPY N/A 05/11/2014   Procedure: COLONOSCOPY;  Surgeon: Shann Medal, MD;  [facility];  Service: General;  Laterality: N/A;  . DILATION AND CURETTAGE OF UTERUS  1992  . LATISSIMUS FLAP TO BREAST Left 01/11/2014   Procedure: LEFT LATISSIMUS FLAP TO BREAST WITH SALINE IMPLANT FOR RECONSTRUCTION;  Surgeon: Crissie Reese, MD;   Location: [facility];  Service: Plastics;  Laterality: Left;  Marland Kitchen MASTECTOMY MODIFIED RADICAL  08/25/2012   left  . PORTACATH PLACEMENT  02/11/2012   Procedure: INSERTION PORT-A-CATH;  Surgeon: Shann Medal, MD;  Location: [facility];  Service: General;  Laterality: Right;  Subclavian    There were no vitals filed for this visit.  Subjective Assessment - 11/13/19 1408    Subjective  Pt reports that she has been doing her exercises at home and has been doing well.    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  ER/PR positive at 63/5% respectively, HER-2 negative, elevated Ki-67.    Patient Stated Goals  I want to reduce my swelling and try to eliminate the need the need for compression.    Currently in Pain?  No/denies                  Outpatient Rehab from [facility]23/[facility]  Lymphedema Life Impact Scale Total Score  19.12 %           [facility] Adult PT Treatment/Exercise - 11/13/19 0001      Manual Therapy   Manual Therapy  Edema management;Manual Lymphatic Drainage (MLD);Soft tissue mobilization    Manual  therapy comments  Pt was taped for the L shoulder blade into retraction and depression in order to promote upright posture with 2 I strips 1 following the lower trap and the other straight across top with over 50% stretch anchored on both ends with no stretch.     Soft tissue mobilization  Palpable tightness/tenderness noted at the L biceps; decreased moderately following light STM followed by MLD    Manual Lymphatic Drainage (MLD)  In supine: short neck, superficial and deep abdominals, Bil axillary nodes, Bil inguinal nodes, established anterior inter-axillary and L axillo-inguinal anastomosis, L medial to lateral brachium, L lateral brachium, L antebrachium all surfaces with extra time spent here, dorsum of the L hand then  reworked all surfaces and finished with 5 diaphragmatic breathes.              PT Education - 11/13/19 1553    Education Details  Pt will continue with scapular series at this time. Discussed working on posture in order to facilitate lymphatic flow out of the L upper quadrant and LUE.    Person(s) Educated  Patient    Methods  Explanation    Comprehension  Verbalized understanding       PT Short Term Goals - 10/23/19 1705      PT SHORT TERM GOAL #1   Title  Pt will be independent with initial HEP in order to promote autonomy of care for scapular stabilization exercises.    Baseline  Pt is performing yoga but nothing to specifically target her L shoulder/scapular area    Time  2    Period  Weeks    Status  New    Target Date  11/13/19        PT Long Term Goals - 10/23/19 1706      PT LONG TERM GOAL #1   Title  Pt will demonstrate improved ROM in the L shoulder to 160 degrees shoulder flexion to demonstrate improved functional ROM.    Baseline  L shoulder fleixon 146    Time  4    Period  Weeks    Status  New    Target Date  11/27/19      PT LONG TERM GOAL #2   Title  Pt will be able to demonstrate improve posture when asked in sitting to decrease fwd head posture and rounded shoulders demonstrating increased activation at Bil shoulder blade w/o cueing for correct position.    Baseline  Pt demonstrates fwd head posture and anteriorly rolled shoulders.    Time  4    Period  Weeks    Status  New    Target Date  11/27/19      PT LONG TERM GOAL #3   Title  Patient to be properly fitted with compression night garment and bra to wear on daily basis.    Baseline  Pt is not wearing a compressive bra and has a very old night garment that she has to prop her arm up with a pillow to decrease stress on the shoulder.    Time  4    Period  Weeks    Status  New    Target Date  11/27/19      PT LONG TERM GOAL #4   Title  Patient will have reduction of limb girth by 1 cm  circumferential measurements in the distal brachium, proximal antebrachium of the LUE    Baseline  see measurements    Time  4  Period  Weeks    Status  New    Target Date  11/27/19            Plan - 11/13/19 1408    Clinical Impression Statement  Pt was taped for improved posture this session to facilitate lymphatic flow out of the L scapular area. MLD was performed to the anterior trunk and the LUE this session. Palpable tightness/tenderness noted at the longhead of the biceps tendon on the L that decreased following light STM followed by MLD in order to facilitate lymphatic flow. Discussed compression bra and flat vs. circular knit for compression at the LUE. Pt will benefit from continued POC at this time.    Stability/Clinical Decision Making  Stable/Uncomplicated    Rehab Potential  Excellent    PT Frequency  1x / week    PT Duration  4 weeks    PT Treatment/Interventions  ADLs/Self Care Home Management;Electrical Stimulation;Cryotherapy;Iontophoresis 84m/ml Dexamethasone;Moist Heat;Functional mobility training;Therapeutic activities;Therapeutic exercise;Neuromuscular re-education;Patient/family education;Manual lymph drainage;Compression bandaging;Passive range of motion;Manual techniques;Taping    PT Next Visit Plan  MLD for the LUQ and self MLD, edema taping for L shoulder blade,  Assess HEP and progress scapular stablization exercises (update HEP)    PT Home Exercise Plan  Access Code: [email or web address]   Consulted and Agree with Plan of Care  Patient       Patient will benefit from skilled therapeutic intervention in order to improve the following deficits and impairments:  Decreased range of motion, Postural dysfunction, Increased edema  Visit Diagnosis: Malignant neoplasm of lower-outer quadrant of left breast of female, estrogen receptor positive (HCC)  Postmastectomy lymphedema  Stiffness of left shoulder, not elsewhere classified  Abnormal posture     Problem  List Patient Active Problem List   Diagnosis Date Noted  . Osteopenia 01/17/2015  . Hepatic steatosis 12/27/2013  . Malignant ne[place] of lower-outer quadrant of left breast of female, [place] positive [place] 09/26/2013  . Elevated liver enzymes 06/26/2013  . Wears glasses   . Seasonal allergies   . Hx of radiation therapy     CAnder Purpura PT 11/13/2019, 4:21 PM  [place] [address]Phone: [phone]  Fax:  [phone] Name: Sydney TROOSTMRN: [id number] of Birth: 107-Apr-1962

## 2019-11-20 ENCOUNTER — Ambulatory Visit: Payer: Commercial Managed Care - PPO

## 2019-11-20 ENCOUNTER — Other Ambulatory Visit: Payer: Self-pay

## 2019-11-20 DIAGNOSIS — R293 Abnormal posture: Secondary | ICD-10-CM

## 2019-11-20 DIAGNOSIS — I972 Postmastectomy lymphedema syndrome: Secondary | ICD-10-CM

## 2019-11-20 DIAGNOSIS — M25612 Stiffness of left shoulder, not elsewhere classified: Secondary | ICD-10-CM

## 2019-11-20 DIAGNOSIS — C50512 Malignant neoplasm of lower-outer quadrant of left female breast: Secondary | ICD-10-CM | POA: Diagnosis not present

## 2019-11-20 NOTE — Therapy (Signed)
[place], [place], [address] Phone: [phone]   Fax:  [phone]  Physical Therapy Discharge Note  Patient Details  Name: Sydney Cervantes MRN: [id number] Date of Birth: 12-21-1960 Referring Provider (PT): Lurline Del MD   Encounter Date: 11/20/2019  PT End of Session - 11/20/19 1009    Visit Number  4    Number of Visits  5    Date for PT Re-Evaluation  11/27/19    PT Start Time  1007    PT Stop Time  1100    PT Time Calculation (min)  53 min    Activity Tolerance  Patient tolerated treatment well    Behavior During Therapy  [facility] for tasks assessed/performed       Past Medical History:  Diagnosis Date  . Anemia    "chemo induced" (08/25/2012)  . Breast cancer ([place])    left  . Carcinoid tumor of colon, benign    partial colectomy ,1987  . GERD (gastroesophageal reflux disease)    occ; "side effect of chemo" (08/25/2012)  . Hepatic steatosis 12/27/2013  . Hx of colonoscopy 2000  . Hx of radiation therapy 09/21/12 -11/17/12   R supraclav fossa, L chest wall/SCVL fossa/PAB mid axilla/IM nodes/scar boost  . Seasonal allergies    "when molds and things come out; pollens" (08/25/2012)  . Wears glasses     Past Surgical History:  Procedure Laterality Date  . APPENDECTOMY  1987  . BOWEL RESECTION  1987   Carcinoid Tumor of Appendix  . BREAST BIOPSY  01/2012   left  . BREAST REDUCTION SURGERY Right 01/11/2014   Procedure: RIGHT BREAST REDUCTION ;  Surgeon: Crissie Reese, MD;  Location: [facility];  Service: Plastics;  Laterality: Right;  . CHOLECYSTECTOMY  2003  . COLONOSCOPY N/A 05/11/2014   Procedure: COLONOSCOPY;  Surgeon: Shann Medal, MD;  [facility];  Service: General;  Laterality: N/A;  . DILATION AND CURETTAGE OF UTERUS  1992  . LATISSIMUS FLAP TO BREAST Left 01/11/2014   Procedure: LEFT LATISSIMUS FLAP TO BREAST WITH SALINE IMPLANT FOR RECONSTRUCTION;  Surgeon: Crissie Reese, MD;   Location: [place];  Service: Plastics;  Laterality: Left;  Marland Kitchen MASTECTOMY MODIFIED RADICAL  08/25/2012   left  . PORTACATH PLACEMENT  02/11/2012   Procedure: INSERTION PORT-A-CATH;  Surgeon: Shann Medal, MD;  Location: [place];  Service: General;  Laterality: Right;  Subclavian    There were no vitals filed for this visit.  Subjective Assessment - 11/20/19 1009    Subjective  Pt states that she has been doing her exercises at home and continues to feel like she is doing well. Pt stated that she has got her pump and Denny Peon is coming to help her with set up on Thursday. Pt has lost her insurance and will be canceling the rest of her appointments at this time.    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  ER/PR positive at 63/5% respectively, HER-2 negative, elevated Ki-67.    Patient Stated Goals  I want to reduce my swelling and try to eliminate the need the need for compression.    Currently in Pain?  No/denies         [facility] PT Assessment - 11/20/19 0001      AROM   Left Shoulder Flexion  154 Degrees    Left Shoulder ABduction  168 Degrees    Left Shoulder Internal Rotation  88 Degrees    Left Shoulder External Rotation  78 Degrees        LYMPHEDEMA/ONCOLOGY QUESTIONNAIRE - 11/20/19 1016      Left Upper Extremity Lymphedema   15 cm Proximal to Olecranon Process  29 cm    10 cm Proximal to Olecranon Process  28.6 cm    Olecranon Process  23.4 cm    15 cm Proximal to Ulnar Styloid Process  25.8 cm    10 cm Proximal to Ulnar Styloid Process  23 cm    Just Proximal to Ulnar Styloid Process  14.8 cm    Across Hand at [employer]  17.3 cm    At [place] of 2nd Digit  5.5 cm           Outpatient Re[facility] from [facility]23/[facility]  Lymphedema Life Impact Scale Total Score  19.12 %           [facility] Adult PT  Treatment/Exercise - 11/20/19 0001      Manual Therapy   Manual Therapy  Manual Lymphatic Drainage (MLD)    Manual Lymphatic Drainage (MLD)  In Supine: short neck, swimming in the terminus, Bil axillary and inguinal nodes, L axillo-inguinal anastomosis, medial/lateral brachium, lateral brachium, all surfaces of antebrachium, dorsum of the hand, posterior inter-axillary anastomosis, re-worked all surfaces. Deep abdominals.              PT Education - 11/20/19 1210    Education Details  Pt was educated on using her pump, the use of the night garment in conjunction with her day garment. Discussed the importance of continuing with her MLD at home and with wearing her compression until she receives her vasopneumatic pump. Discussed discharge at this time due to pt preference.    Person(s) Educated  Patient    Methods  Explanation    Comprehension  Verbalized understanding       PT Short Term Goals - 11/20/19 1025      PT SHORT TERM GOAL #1   Title  Pt will be independent with initial HEP in order to promote autonomy of care for scapular stabilization exercises.    Baseline  Pt is performing her exercises.    Time  2    Period  Weeks    Status  Achieved    Target Date  11/13/19        PT Long Term Goals - 11/20/19 1026      PT LONG TERM GOAL #1   Title  Pt will demonstrate improved ROM in the L shoulder to 160 degrees shoulder flexion to demonstrate improved functional ROM.    Baseline  Progressing L shoulder flexion 154    Status  Partially Met      PT LONG TERM GOAL #2   Title  Pt will be able to demonstrate improve posture when asked in sitting to decrease fwd head posture and rounded shoulders demonstrating increased activation at Bil shoulder blade w/o cueing for correct position.    Baseline  Pt is able to demonstrate good posture and has been working on scapular exercises.    Status  Achieved      PT LONG TERM GOAL #3   Title  Patient to be properly fitted with  compression night garment and bra to wear on daily basis.    Baseline  Pt is getting measured today.    Status  Partially Met  PT LONG TERM GOAL #4   Title  Patient will have reduction of limb girth by 1 cm circumferential measurements in the distal brachium, proximal antebrachium of the LUE    Baseline  see measurements. Goal not met.    Status  Not Met            Plan - 11/20/19 1008    Clinical Impression Statement  Pt reports that she has lost her insurance and would like to cancel her last appointment. She is self pay today and is aware of that. She is progressing with her goals but has only met a couple of them at this point. She has a slight increase in L antebrachium circumferential measurements this session, but she has received her vasopneumatic pump and will be using that at home. MLD was performed for the anterior turnk, LUE and to the L posterior upper quadrant in R side-lying acros the posterior inter-axillary anastomosis. She is getting measured today for night garment. Pt will be discharged at this time.    PT Frequency  1x / week    PT Duration  4 weeks    PT Treatment/Interventions  ADLs/Self Care Home Management;Electrical Stimulation;Cryotherapy;Iontophoresis 6m/ml Dexamethasone;Moist Heat;Functional mobility training;Therapeutic activities;Therapeutic exercise;Neuromuscular re-education;Patient/family education;Manual lymph drainage;Compression bandaging;Passive range of motion;Manual techniques;Taping    PT Next Visit Plan  MLD for the LUQ and self MLD, edema taping for L shoulder blade,  Assess HEP and progress scapular stablization exercises (update HEP)    PT Home Exercise Plan  Access Code: [email or web address]   Consulted and Agree with Plan of Care  Patient       Patient will benefit from skilled therapeutic intervention in order to improve the following deficits and impairments:  Decreased range of motion, Postural dysfunction, Increased edema  Visit  Diagnosis: Malignant neoplasm of lower-outer quadrant of left breast of female, estrogen receptor positive (HCC)  Postmastectomy lymphedema  Stiffness of left shoulder, not elsewhere classified  Abnormal posture     Problem List Patient Active Problem List   Diagnosis Date Noted  . Osteopenia 01/17/2015  . Hepatic steatosis 12/27/2013  . Malignant neoplasm of lower-outer quadrant of left breast of female, estrogen receptor positive ([place] 09/26/2013  . Elevated liver enzymes 06/26/2013  . Wears glasses   . Seasonal allergies   . Hx of radiation therapy     CAnder Purpura PT 11/20/2019, 12:13 PM  [place] [address]Phone: [phone]  Fax:  [phone] Name: TKOLLINS FENTERMRN: [id number] of Birth: 106/12/1960

## 2019-11-27 ENCOUNTER — Ambulatory Visit: Payer: Commercial Managed Care - PPO

## 2020-10-02 ENCOUNTER — Encounter: Payer: Self-pay | Admitting: Oncology

## 2020-10-21 NOTE — Progress Notes (Signed)
Sydney Cervantes   DOB: 30-May-1961  MR#: [id number]  DTO#:[id number]  Patient Care Team: Hayden Rasmussen, MD as PCP - General (Family Medicine) Avon Gully, NP as Nurse Practitioner (Obstetrics and Gynecology) Seyed Heffley, Virgie Dad, MD as Consulting Physician (Oncology) OTHER MD: Crissie Reese, MD  CHIEF COMPLAINT: Estrogen receptor positive breast cancer (s/p left mastectomy)  CURRENT TREATMENT: Tamoxifen   INTERVAL HISTORY:  Sydney Cervantes returns today for follow-up of her estrogen receptor positive breast cancer accompanied by her husband Event organiser.   She continues on tamoxifen.  She has occasional hot flashes and no real vaginal wetness in fact she has more of a vaginal dryness problem.  She has stopped the Estrace which was not working.  Recall her insurance would not approve Estring.  Since her last visit, she underwent bone density screening at [facility] on 09/04/2020 showing a T-score of -2.2, which is considered osteopenic.   REVIEW OF SYSTEMS: Sydney Cervantes works from home and both she and her husband are pretty much staying there through the pandemic.  She does yoga and walks about an hour at a time when she does walk which is about once a week.  Her hair is thinning a little bit.  Aside from that a detailed review of systems today was stable   COVID 19 VACCINATION STATUS: Status post [place] x2+ booster late October   BREAST CANCER HISTORY: From Dr. Rada Hay prior summary note:  1. 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  ER/PR positive at 63/5% respectively, HER-2 negative, elevated Ki-67. Completed 4/6 planned neoadjuvant dose dense FEC with Neulasta support on day 2. 20% dose reduction on 5-FU, 50% dose reduction on epirubicin, 40% dose reduction on Cytoxan, after one-week delay due to anemia/thrombocytopenia/and region of probable localized thrombophlebitis versus mild chemotherapy  extravasation on the left forearm, covered with Keflex 500 mg by mouth 3 times a day. Switched to single agent neoadjuvant Taxol, given 3 weeks on 1 week off, completed 4 cycles on 08/02/2012.   2. Status post left modified radical mastectomy on 08/25/2012 with final pathology showing no tumor in the primary tumor bed with 7 of 8 involved lymph nodes. The final pathology showed an ER positive tumor, HER-2 negative.  She completed radiation on 11/18/12 and started taking Tamoxifen on 11/21/2012.  It is also noted that she is BRCA negative.   Her subsequent history is as detailed below   PAST MEDICAL HISTORY: Past Medical History:  Diagnosis Date  . Anemia    "chemo induced" (08/25/2012)  . Breast cancer ([place])    left  . Carcinoid tumor of colon, benign    partial colectomy ,1987  . GERD (gastroesophageal reflux disease)    occ; "side effect of chemo" (08/25/2012)  . Hepatic steatosis 12/27/2013  . Hx of colonoscopy 2000  . Hx of radiation therapy 09/21/12 -11/17/12   R supraclav fossa, L chest wall/SCVL fossa/PAB mid axilla/IM nodes/scar boost  . Seasonal allergies    "when molds and things come out; pollens" (08/25/2012)  . Wears glasses     PAST SURGICAL HISTORY: Past Surgical History:  Procedure Laterality Date  . APPENDECTOMY  1987  . BOWEL RESECTION  1987   Carcinoid Tumor of Appendix  . BREAST BIOPSY  01/2012   left  . BREAST REDUCTION SURGERY Right 01/11/2014   Procedure: RIGHT BREAST REDUCTION ;  Surgeon: Crissie Reese, MD;  Location: [facility];  Service: Plastics;  Laterality: Right;  .  CHOLECYSTECTOMY  2003  . COLONOSCOPY N/A 05/11/2014   Procedure: COLONOSCOPY;  Surgeon: Shann Medal, MD;  Location: [facility];  Service: General;  Laterality: N/A;  . DILATION AND CURETTAGE OF UTERUS  1992  . LATISSIMUS FLAP TO BREAST Left 01/11/2014   Procedure: LEFT LATISSIMUS FLAP TO BREAST WITH SALINE IMPLANT FOR RECONSTRUCTION;  Surgeon: Crissie Reese, MD;  Location: [facility];  Service:  Plastics;  Laterality: Left;  Marland Kitchen MASTECTOMY MODIFIED RADICAL  08/25/2012   left  . PORTACATH PLACEMENT  02/11/2012   Procedure: INSERTION PORT-A-CATH;  Surgeon: Shann Medal, MD;  Location: [facility];  Service: General;  Laterality: Right;  Subclavian    FAMILY HISTORY Family History  Problem Relation Age of Onset  . Breast cancer Mother   . Breast cancer Paternal Aunt   . Cancer Maternal Grandmother        Hodgkins Disease    GYNECOLOGIC HISTORY:   G3P2.  Menarche at 59 years of age.  Followed by [name], NP.     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 Scott works in Engineer, technical sales for [employer]. Daughter Sydney Cervantes currently 81 years old is a Environmental education officer in psychology and exercise physiology hoping to become an occupational therapist. Daughter Sydney Cervantes studied Therapist, occupational at [employer]	[place] and hopes to be in [place] ; she is currently living at home with the patient. Daughter Sydney Cervantes is currently in [place] History.   ADVANCED DIRECTIVES: In the absence of any documentation to the contrary, the patient's spouse is their HCPOA.    HEALTH MAINTENANCE:   Social History   Socioeconomic History  . Marital status: Married    Spouse name: Not on file  . Number of children: Not on file  . Years of education: Not on file  . Highest education level: Not on file  Occupational History  . Not on file  Tobacco Use  . Smoking status: Never Smoker  . Smokeless tobacco: Never Used  Substance and Sexual Activity  . Alcohol use: Yes    Alcohol/week: 7.0 - 10.0 standard drinks    Types: 7 - 10 Glasses of wine per week    Comment: 06/26/2013 - 1-1.5 glasses of wine nightly  . Drug use: No  . Sexual activity: Yes  Other Topics Concern  . Not on file  Social History Narrative  . Not on file   Social Determinants  of Health   Financial Resource Strain:   . Difficulty of Paying Living Expenses: Not on file  Food Insecurity:   . Worried About Charity fundraiser in the Last Year: Not on file  . Ran Out of Food in the Last Year: Not on file  Transportation Needs:   . Lack of Transportation (Medical): Not on file  . Lack of Transportation (Non-Medical): Not on file  Physical Activity:   . Days of Exercise per Week: Not on file  . Minutes of Exercise per Session: Not on file  Stress:   . Feeling of Stress : Not on file  Social Connections:   . Frequency of Communication with Friends and Family: Not on file  . Frequency of Social Gatherings with Friends and Family: Not on file  . Attends Religious Services: Not on file  . Active Member of Clubs or Organizations: Not on file  . Attends Archivist  Meetings: Not on file  . Marital Status: Not on file  Intimate Partner Violence:   . Fear of Current or Ex-Partner: Not on file  . Emotionally Abused: Not on file  . Physically Abused: Not on file  . Sexually Abused: Not on file     COLONOSCOPY: Approx 2000  PAP:  Not on file  BONE DENSITY:   January 2016  LIPIDS:  Not on file    Allergies  Allergen Reactions  . Adhesive [Tape] Itching and Rash    Rash and itching at steristrip and EKG lead pad sites  . Codeine     Headache    Current Outpatient Medications  Medication Sig Dispense Refill  . Calcium Carbonate-Vitamin D (CALTRATE 600+D PO) Take 1 tablet by mouth daily.     . cholecalciferol (VITAMIN D) 1000 units tablet Take 1 tablet (1,000 Units total) by mouth daily.    . Cyanocobalamin (B-12) 1000 MCG CAPS Take by mouth.    . estradiol (ESTRACE VAGINAL) 0.1 MG/GM vaginal cream Place 1 Applicatorful vaginally at bedtime. 42.5 g 12  . Probiotic Product (PROBIOTIC DAILY PO) Take 2 capsules by mouth daily.    . tamoxifen (NOLVADEX) 20 MG tablet Take 1 tablet (20 mg total) by mouth daily. 90 tablet 3   No current  facility-administered medications for this visit.   OBJECTIVE: White woman in no acute distress  Vitals:   10/22/20 1016  BP: 131/81  Pulse: 89  Resp: 18  Temp: (!) 97.1 F (36.2 C)  SpO2: 99%   Body mass index is 22.93 kg/m. ECOG FS: 0 - Asymptomatic  Sclerae unicteric, EOMs intact Wearing a mask No cervical or supraclavicular adenopathy Lungs no rales or rhonchi Heart regular rate and rhythm Abd soft, nontender, positive bowel sounds MSK no focal spinal tenderness, chronic mild left upper extremity lymphedema Neuro: nonfocal, well oriented, appropriate affect Breasts: The right breast is status post reduction mammoplasty.  The left breast is status post mastectomy with saline implant reconstruction.  There is no evidence of recurrence.  Both axillae are benign.   LAB RESULTS:  CBC    Component Value Date/Time   WBC 5.0 10/22/2020 0939   WBC 4.4 09/29/2017 1009   WBC 5.4 01/02/2014 1337   RBC 4.22 10/22/2020 0939   HGB 13.4 10/22/2020 0939   HGB 14.4 09/29/2017 1009   HCT 41.5 10/22/2020 0939   HCT 43.4 09/29/2017 1009   PLT 134 (L) 10/22/2020 0939   PLT 133 (L) 09/29/2017 1009   MCV 98.3 10/22/2020 0939   MCV 99.1 09/29/2017 1009   MCH 31.8 10/22/2020 0939   MCHC 32.3 10/22/2020 0939   RDW 13.1 10/22/2020 0939   RDW 13.4 09/29/2017 1009   LYMPHSABS 1.8 10/22/2020 0939   LYMPHSABS 1.3 09/29/2017 1009   MONOABS 0.6 10/22/2020 0939   MONOABS 0.5 09/29/2017 1009   EOSABS 0.3 10/22/2020 0939   EOSABS 0.2 09/29/2017 1009   BASOSABS 0.0 10/22/2020 0939   BASOSABS 0.0 09/29/2017 1009   CMP Latest Ref Rng & Units 10/22/2020 10/20/2019 09/29/2018  Glucose 70 - 99 mg/dL 95 95 97  BUN 6 - 20 mg/dL '16 15 15  ' Creatinine 0.44 - 1.00 mg/dL 0.83 0.85 0.83  Sodium 135 - 145 mmol/L 141 141 141  Potassium 3.5 - 5.1 mmol/L 4.0 4.5 4.1  Chloride 98 - 111 mmol/L 107 104 107  CO2 22 - 32 mmol/L 25 26 21(L)  Calcium 8.9 - 10.3 mg/dL 9.8 9.5 9.3  Total Protein 6.5 -  8.1  g/dL 6.6 6.5 6.6  Total Bilirubin 0.3 - 1.2 mg/dL 0.6 0.4 0.5  Alkaline Phos 38 - 126 U/L 74 83 80  AST 15 - 41 U/L 30 54(H) 30  ALT 0 - 44 U/L 30 72(H) 30     STUDIES: No results found.   ASSESSMENT:  59 y.o. BRCA negative  woman  (1) s/p left breast lower outer quadrant and left axillary lymph node biopsies 01/18/2012 for a clinical T4 N3, stage IIIC invasive lobular carcinoma, grade 3, estrogen receptor 62% positive, progesterone receptor 5% positive, with an MIB-1 of 33% and no HER-2 amplification.  (2) Began chemotherapy in March 2013, and received 4/6 planned neoadjuvant cycles of dose dense cyclophosphamide, epirubicin and fluorouracil followed by weekly paclitaxel x 12 completed Taxol 08/02/2012.   (3) left modified radical mastectomy 08/25/2012 showed no residual tumor in the breast, but 7/8 sampled lymph nodes were positive, 6 with macro metastases. Repeat HER-2 was again negative.  (4) She completed radiation on 11/18/12   (5) started tamoxifen on 11/21/2012  (a) opted against switching to aromatase inhibitors  (6) on 01/11/2014 underwent left latissimus dorsi myocutaneous flap reconstruction of the left breast, with saline implant, and right breast mammoplasty for symmetry  (7) history of a cecal carcinoid resected 1987, with an unremarkable colonoscopy 05/14/2014  (8) hepatic steatosis, with occasional AST elevation: no trend noted  (9) osteopenia -   (a) T-score   -1.9 on dexa  12/21/14 at [facility]  (b) repeat bone density 08/24/2018 T score -1.8  (c) repeat bone density 09/04/2020 shows a T score of -2.2   PLAN: Sydney Cervantes is now 8 years out from definitive surgery for breast cancer with no evidence of disease recurrence.  This is very favorable.  She continues to tolerate tamoxifen well and the plan is to continue that a total of 10 years.  She is no longer using vaginal estrogens.  She "makes 2" with lubricants she says.  At this point she does not express  an interest in our pelvic rehab program  I wrote her for additional left upper extremity compression sleeves  She will see me again in 1 year.  She knows to call for any other issue that may develop before that visit  Total encounter time 25 minutes.*   Neesa Knapik, Virgie Dad, MD  10/22/20 10:39 AM Medical Oncology and Hematology [facility] [place], [place] [address] Tel. [phone]    Fax. [phone]   This document serves as a record of services personally performed by Lurline Del, MD. It was created on his behalf by Wilburn Mylar, a trained medical scribe. The creation of this record is based on the scribe's personal observations and the provider's statements to them.   I, Lurline Del MD, have reviewed the above documentation for accuracy and completeness, and I agree with the above.   *Total Encounter Time as defined by the Centers for Medicare and Medicaid Services includes, in addition to the face-to-face time of a patient visit (documented in the note above) non-face-to-face time: obtaining and reviewing outside history, ordering and reviewing medications, tests or procedures, care coordination (communications with other health care professionals or caregivers) and documentation in the medical record.

## 2020-10-22 ENCOUNTER — Other Ambulatory Visit: Payer: Self-pay

## 2020-10-22 ENCOUNTER — Inpatient Hospital Stay: Payer: BLUE CROSS/BLUE SHIELD

## 2020-10-22 ENCOUNTER — Inpatient Hospital Stay: Payer: BLUE CROSS/BLUE SHIELD | Attending: Oncology | Admitting: Oncology

## 2020-10-22 ENCOUNTER — Telehealth: Payer: Self-pay | Admitting: Oncology

## 2020-10-22 VITALS — BP 131/81 | HR 89 | Temp 97.1°F | Resp 18 | Ht 68.0 in | Wt 150.8 lb

## 2020-10-22 DIAGNOSIS — R232 Flushing: Secondary | ICD-10-CM | POA: Insufficient documentation

## 2020-10-22 DIAGNOSIS — Z803 Family history of malignant neoplasm of breast: Secondary | ICD-10-CM | POA: Diagnosis not present

## 2020-10-22 DIAGNOSIS — Z923 Personal history of irradiation: Secondary | ICD-10-CM | POA: Insufficient documentation

## 2020-10-22 DIAGNOSIS — M8580 Other specified disorders of bone density and structure, unspecified site: Secondary | ICD-10-CM

## 2020-10-22 DIAGNOSIS — Z79810 Long term (current) use of selective estrogen receptor modulators (SERMs): Secondary | ICD-10-CM | POA: Insufficient documentation

## 2020-10-22 DIAGNOSIS — K219 Gastro-esophageal reflux disease without esophagitis: Secondary | ICD-10-CM | POA: Diagnosis not present

## 2020-10-22 DIAGNOSIS — C50512 Malignant neoplasm of lower-outer quadrant of left female breast: Secondary | ICD-10-CM

## 2020-10-22 DIAGNOSIS — K760 Fatty (change of) liver, not elsewhere classified: Secondary | ICD-10-CM | POA: Diagnosis not present

## 2020-10-22 DIAGNOSIS — Z9012 Acquired absence of left breast and nipple: Secondary | ICD-10-CM | POA: Insufficient documentation

## 2020-10-22 DIAGNOSIS — Z170 Estrogen receptor positive status [ER+]: Secondary | ICD-10-CM | POA: Diagnosis not present

## 2020-10-22 DIAGNOSIS — C50412 Malignant neoplasm of upper-outer quadrant of left female breast: Secondary | ICD-10-CM | POA: Diagnosis not present

## 2020-10-22 DIAGNOSIS — C773 Secondary and unspecified malignant neoplasm of axilla and upper limb lymph nodes: Secondary | ICD-10-CM | POA: Diagnosis not present

## 2020-10-22 LAB — CBC WITH DIFFERENTIAL (CANCER CENTER ONLY)
Abs Immature Granulocytes: 0.01 10*3/uL (ref 0.00–0.07)
Basophils Absolute: 0 10*3/uL (ref 0.0–0.1)
Basophils Relative: 1 %
Eosinophils Absolute: 0.3 10*3/uL (ref 0.0–0.5)
Eosinophils Relative: 6 %
HCT: 41.5 % (ref 36.0–46.0)
Hemoglobin: 13.4 g/dL (ref 12.0–15.0)
Immature Granulocytes: 0 %
Lymphocytes Relative: 36 %
Lymphs Abs: 1.8 10*3/uL (ref 0.7–4.0)
MCH: 31.8 pg (ref 26.0–34.0)
MCHC: 32.3 g/dL (ref 30.0–36.0)
MCV: 98.3 fL (ref 80.0–100.0)
Monocytes Absolute: 0.6 10*3/uL (ref 0.1–1.0)
Monocytes Relative: 11 %
Neutro Abs: 2.3 10*3/uL (ref 1.7–7.7)
Neutrophils Relative %: 46 %
Platelet Count: 134 10*3/uL — ABNORMAL LOW (ref 150–400)
RBC: 4.22 MIL/uL (ref 3.87–5.11)
RDW: 13.1 % (ref 11.5–15.5)
WBC Count: 5 10*3/uL (ref 4.0–10.5)
nRBC: 0 % (ref 0.0–0.2)

## 2020-10-22 LAB — CMP (CANCER CENTER ONLY)
ALT: 30 U/L (ref 0–44)
AST: 30 U/L (ref 15–41)
Albumin: 4 g/dL (ref 3.5–5.0)
Alkaline Phosphatase: 74 U/L (ref 38–126)
Anion gap: 9 (ref 5–15)
BUN: 16 mg/dL (ref 6–20)
CO2: 25 mmol/L (ref 22–32)
Calcium: 9.8 mg/dL (ref 8.9–10.3)
Chloride: 107 mmol/L (ref 98–111)
Creatinine: 0.83 mg/dL (ref 0.44–1.00)
GFR, Estimated: >60 mL/min (ref >60)
Glucose, Bld: 95 mg/dL (ref 70–99)
Potassium: 4 mmol/L (ref 3.5–5.1)
Sodium: 141 mmol/L (ref 135–145)
Total Bilirubin: 0.6 mg/dL (ref 0.3–1.2)
Total Protein: 6.6 g/dL (ref 6.5–8.1)

## 2020-10-22 MED ORDER — TAMOXIFEN CITRATE 20 MG PO TABS: 20.0000 mg | ORAL_TABLET | Freq: Every day | ORAL | 4 refills | Status: DC

## 2020-10-22 NOTE — Telephone Encounter (Signed)
Scheduled appts per 11/16 los. Gave pt a print out of AVS.

## 2021-09-05 ENCOUNTER — Encounter: Payer: Self-pay | Admitting: Oncology

## 2021-10-22 ENCOUNTER — Other Ambulatory Visit: Payer: Self-pay | Admitting: *Deleted

## 2021-10-22 ENCOUNTER — Other Ambulatory Visit: Payer: Self-pay

## 2021-10-22 DIAGNOSIS — Z170 Estrogen receptor positive status [ER+]: Secondary | ICD-10-CM

## 2021-10-22 NOTE — Progress Notes (Signed)
Sydney Cervantes   DOB: 12-Mar-1961  MR#: [id number]  ESP#:[id number]  Patient Care Team: Hayden Rasmussen, MD as PCP - General (Family Medicine) Avon Gully, NP as Nurse Practitioner (Obstetrics and Gynecology) Thereasa Iannello, Virgie Dad, MD as Consulting Physician (Oncology) OTHER MD: Crissie Reese, MD  CHIEF COMPLAINT: Estrogen receptor positive breast cancer (s/p left mastectomy)  CURRENT TREATMENT: Tamoxifen   INTERVAL HISTORY:  Sydney Cervantes returns today for follow-up of her estrogen receptor positive breast cancer accompanied by her husband Event organiser.   She had unilateral right screening mammography at [facility] on 09/05/2021 showing breast density category A.  There was no evidence of malignancy.  She is already scheduled for her next mammogram September 25, 2022  She continues on tamoxifen.  She tolerates this well, with minimal vaginal wetness, no problems with hot flashes.  She does think it is thinning her hair slightly.  She has stopped the Estrace which was not working.  Recall her insurance would not approve Estring.   REVIEW OF SYSTEMS: Sydney Cervantes is not currently exercising regularly.  She does do some yoga.  A detailed review of systems was otherwise entirely benign   COVID 19 VACCINATION STATUS: Status post Pfizer x4, also had COVID May 2022   BREAST CANCER HISTORY: From Dr. Rada Hay prior summary note:  1. 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  ER/PR positive at 63/5% respectively, HER-2 negative, elevated Ki-67. Completed 4/6 planned neoadjuvant dose dense FEC with Neulasta support on day 2. 20% dose reduction on 5-FU, 50% dose reduction on epirubicin, 40% dose reduction on Cytoxan, after one-week delay due to anemia/thrombocytopenia/and region of probable localized thrombophlebitis versus mild chemotherapy extravasation on the left forearm, covered with Keflex 500 mg by mouth 3 times  a day. Switched to single agent neoadjuvant Taxol, given 3 weeks on 1 week off, completed 4 cycles on 08/02/2012.   2. Status post left modified radical mastectomy on 08/25/2012 with final pathology showing no tumor in the primary tumor bed with 7 of 8 involved lymph nodes. The final pathology showed an ER positive tumor, HER-2 negative.  She completed radiation on 11/18/12 and started taking Tamoxifen on 11/21/2012.  It is also noted that she is BRCA negative.   Her subsequent history is as detailed below   PAST MEDICAL HISTORY: Past Medical History:  Diagnosis Date   Anemia    "chemo induced" (08/25/2012)   Breast cancer ([place])    left   Carcinoid tumor of colon, benign    partial colectomy ,1987   GERD (gastroesophageal reflux disease)    occ; "side effect of chemo" (08/25/2012)   Hepatic steatosis 12/27/2013   Hx of colonoscopy 2000   Hx of radiation therapy 09/21/12 -11/17/12   R supraclav fossa, L chest wall/SCVL fossa/PAB mid axilla/IM nodes/scar boost   Seasonal allergies    "when molds and things come out; pollens" (08/25/2012)   Wears glasses     PAST SURGICAL HISTORY: Past Surgical History:  Procedure Laterality Date   [place]   Carcinoid Tumor of Appendix   BREAST BIOPSY  01/2012   left   BREAST REDUCTION SURGERY Right 01/11/2014   Procedure: RIGHT BREAST REDUCTION ;  Surgeon: Crissie Reese, MD;  Location: [facility];  Service: Plastics;  Laterality: Right;   CHOLECYSTECTOMY  2003   COLONOSCOPY N/A 05/11/2014   Procedure: COLONOSCOPY;  Surgeon: Shann Medal, MD;  Location: [facility];  Service: General;  Laterality: N/A;   DILATION AND CURETTAGE OF UTERUS  1992   LATISSIMUS FLAP TO BREAST Left 01/11/2014   Procedure: LEFT LATISSIMUS FLAP TO BREAST WITH SALINE IMPLANT FOR RECONSTRUCTION;  Surgeon: Crissie Reese, MD;  Location: [facility];  Service: Plastics;  Laterality: Left;   MASTECTOMY MODIFIED RADICAL  08/25/2012   left   PORTACATH PLACEMENT   02/11/2012   Procedure: INSERTION PORT-A-CATH;  Surgeon: Shann Medal, MD;  Location: [facility];  Service: General;  Laterality: Right;  Subclavian    FAMILY HISTORY Family History  Problem Relation Age of Onset   Breast cancer Mother    Breast cancer Paternal Aunt    Cancer Maternal Grandmother        Hodgkins Disease    GYNECOLOGIC HISTORY:   G3P2.  Menarche at 60 years of age.  Followed by [name], NP.     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 Scott works in Engineer, technical sales for [employer]. Daughter Sydney Cervantes currently 87 years old is a Environmental education officer in psychology and exercise physiology hoping to become an occupational therapist. Daughter Sydney Cervantes studied Therapist, occupational at [employer]	[place] and hopes to be in [place] ; she is currently living at home with the patient. Daughter Sydney Cervantes is currently in [place] [employer].   ADVANCED DIRECTIVES: In the absence of any documentation to the contrary, the patient's spouse is their HCPOA.    HEALTH MAINTENANCE:   Social History   Socioeconomic History   Marital status: Married    Spouse name: Not on file   Number of children: Not on file   Years of education: Not on file   Highest education level: Not on file  Occupational History   Not on file  Tobacco Use   Smoking status: Never   Smokeless tobacco: Never  Substance and Sexual Activity   Alcohol use: Yes    Alcohol/week: 7.0 - 10.0 standard drinks    Types: 7 - 10 Glasses of wine per week    Comment: 06/26/2013 - 1-1.5 glasses of wine nightly   Drug use: No   Sexual activity: Yes  Other Topics Concern   Not on file  Social History Narrative   Not on file   Social Determinants of Health   Financial Resource Strain: Not on file  Food Insecurity: Not on file  Transportation Needs: Not on file  Physical  Activity: Not on file  Stress: Not on file  Social Connections: Not on file  Intimate Partner Violence: Not on file     COLONOSCOPY: Approx 2000  PAP:  Not on file  BONE DENSITY:  08/2020, -2.2  LIPIDS:  Not on file    Allergies  Allergen Reactions   Adhesive [Tape] Itching and Rash    Rash and itching at steristrip and EKG lead pad sites   Codeine     Headache    Current Outpatient Medications  Medication Sig Dispense Refill   Calcium Carbonate-Vitamin D (CALTRATE 600+D PO) Take 1 tablet by mouth daily.      cholecalciferol (VITAMIN D) 1000 units tablet Take 1 tablet (1,000 Units total) by mouth daily.     Cyanocobalamin (B-12) 1000 MCG CAPS Take by mouth.     Probiotic Product (PROBIOTIC DAILY PO) Take 2 capsules by mouth daily.     tamoxifen (  NOLVADEX) 20 MG tablet Take 1 tablet (20 mg total) by mouth daily. 90 tablet 4   No current facility-administered medications for this visit.   OBJECTIVE: White woman who appears younger than stated age  4:   10/23/21 0945  BP: 128/81  Pulse: 87  Resp: 18  Temp: (!) 97.4 F (36.3 C)  SpO2: 100%    Body mass index is 22.5 kg/m. ECOG FS: 0 - Asymptomatic  Sclerae unicteric, EOMs intact Wearing a mask No cervical or supraclavicular adenopathy Lungs no rales or rhonchi Heart regular rate and rhythm Abd soft, nontender, positive bowel sounds MSK no focal spinal tenderness, no upper extremity lymphedema Neuro: nonfocal, well oriented, appropriate affect Breasts: The right breast is benign.  It has undergone reduction mammoplasty.  The left breast is status post mastectomy with saline reconstruction.  There is no evidence of local recurrence.  Both axillae are benign   LAB RESULTS:  CBC    Component Value Date/Time   WBC 5.4 10/23/2021 0926   WBC 4.4 09/29/2017 1009   WBC 5.4 01/02/2014 1337   RBC 4.23 10/23/2021 0926   HGB 13.6 10/23/2021 0926   HGB 14.4 09/29/2017 1009   HCT 42.2 10/23/2021 0926   HCT 43.4  09/29/2017 1009   PLT 146 (L) 10/23/2021 0926   PLT 133 (L) 09/29/2017 1009   MCV 99.8 10/23/2021 0926   MCV 99.1 09/29/2017 1009   MCH 32.2 10/23/2021 0926   MCHC 32.2 10/23/2021 0926   RDW 12.9 10/23/2021 0926   RDW 13.4 09/29/2017 1009   LYMPHSABS 1.6 10/23/2021 0926   LYMPHSABS 1.3 09/29/2017 1009   MONOABS 0.6 10/23/2021 0926   MONOABS 0.5 09/29/2017 1009   EOSABS 0.3 10/23/2021 0926   EOSABS 0.2 09/29/2017 1009   BASOSABS 0.0 10/23/2021 0926   BASOSABS 0.0 09/29/2017 1009   CMP Latest Ref Rng & Units 10/23/2021 10/22/2020 10/20/2019  Glucose 70 - 99 mg/dL 104(H) 95 95  BUN 6 - 20 mg/dL '12 16 15  ' Creatinine 0.44 - 1.00 mg/dL 0.84 0.83 0.85  Sodium 135 - 145 mmol/L 140 141 141  Potassium 3.5 - 5.1 mmol/L 4.4 4.0 4.5  Chloride 98 - 111 mmol/L 105 107 104  CO2 22 - 32 mmol/L '27 25 26  ' Calcium 8.9 - 10.3 mg/dL 9.8 9.8 9.5  Total Protein 6.5 - 8.1 g/dL 6.5 6.6 6.5  Total Bilirubin 0.3 - 1.2 mg/dL 0.3 0.6 0.4  Alkaline Phos 38 - 126 U/L 80 74 83  AST 15 - 41 U/L 28 30 54(H)  ALT 0 - 44 U/L 30 30 72(H)     STUDIES: No results found.   ASSESSMENT:  60 y.o. BRCA negative [place] woman  (1) s/p left breast lower outer quadrant and left axillary lymph node biopsies 01/18/2012 for a clinical T4 N3, stage IIIC invasive lobular carcinoma, grade 3, estrogen receptor 62% positive, progesterone receptor 5% positive, with an MIB-1 of 33% and no HER-2 amplification.  (2) Began chemotherapy in March 2013, and received 4/6 planned neoadjuvant cycles of dose dense cyclophosphamide, epirubicin and fluorouracil followed by weekly paclitaxel x 12 completed Taxol 08/02/2012.   (3) left modified radical mastectomy 08/25/2012 showed no residual tumor in the breast, but 7/8 sampled lymph nodes were positive, 6 with macro metastases. Repeat HER-2 was again negative.  (4) She completed radiation on 11/18/12   (5) started tamoxifen on 11/21/2012  (a) opted against switching to aromatase  inhibitors  (6) on 01/11/2014 underwent left latissimus dorsi myocutaneous flap  reconstruction of the left breast, with saline implant, and right breast mammoplasty for symmetry  (7) history of a cecal carcinoid resected 1987, with an unremarkable colonoscopy 05/14/2014  (8) hepatic steatosis, with occasional AST elevation: no trend noted  (9) osteopenia -   (a) T-score   -1.9 on dexa  12/21/14 at [place]  (b) repeat bone density 08/24/2018 T score -1.8  (c) repeat bone density 09/04/2020 shows a T score of -2.2   PLAN: Izzie is now 9 years out from definitive surgery for her breast cancer with no evidence of disease recurrence.  This is very favorable.  She is tolerating tamoxifen well.  The plan is to continue this 1 more year after which she will likely "graduate" from follow-up here.  I wrote her for additional left upper extremity compression sleeves  I encouraged her to walk on a regular basis, the goal being 45 minutes 5 days a week.  Total encounter time 20 minutes.*   Dalani Mette, Virgie Dad, MD  10/23/21 10:20 AM Medical Oncology and Hematology [facility] [place], [place] [address] Tel. [phone]    Fax. [phone]   This document serves as a record of services personally performed by Lurline Del, MD. It was created on his behalf by Wilburn Mylar, a trained medical scribe. The creation of this record is based on the scribe's personal observations and the provider's statements to them.   I, Lurline Del MD, have reviewed the above documentation for accuracy and completeness, and I agree with the above.   *Total Encounter Time as defined by the Centers for Medicare and Medicaid Services includes, in addition to the face-to-face time of a patient visit (documented in the note above) non-face-to-face time: obtaining and reviewing outside history, ordering and reviewing medications, tests or procedures, care coordination  (communications with other health care professionals or caregivers) and documentation in the medical record.

## 2021-10-23 ENCOUNTER — Other Ambulatory Visit: Payer: Self-pay

## 2021-10-23 ENCOUNTER — Inpatient Hospital Stay: Payer: Managed Care, Other (non HMO)

## 2021-10-23 ENCOUNTER — Inpatient Hospital Stay: Payer: Managed Care, Other (non HMO) | Attending: Oncology | Admitting: Oncology

## 2021-10-23 ENCOUNTER — Other Ambulatory Visit: Payer: Self-pay | Admitting: *Deleted

## 2021-10-23 VITALS — BP 128/81 | HR 87 | Temp 97.4°F | Resp 18 | Ht 68.0 in | Wt 148.0 lb

## 2021-10-23 DIAGNOSIS — K219 Gastro-esophageal reflux disease without esophagitis: Secondary | ICD-10-CM | POA: Diagnosis not present

## 2021-10-23 DIAGNOSIS — Z9221 Personal history of antineoplastic chemotherapy: Secondary | ICD-10-CM | POA: Diagnosis not present

## 2021-10-23 DIAGNOSIS — M8580 Other specified disorders of bone density and structure, unspecified site: Secondary | ICD-10-CM | POA: Diagnosis not present

## 2021-10-23 DIAGNOSIS — Z170 Estrogen receptor positive status [ER+]: Secondary | ICD-10-CM | POA: Diagnosis not present

## 2021-10-23 DIAGNOSIS — C50512 Malignant neoplasm of lower-outer quadrant of left female breast: Secondary | ICD-10-CM | POA: Insufficient documentation

## 2021-10-23 DIAGNOSIS — Z9012 Acquired absence of left breast and nipple: Secondary | ICD-10-CM | POA: Diagnosis not present

## 2021-10-23 DIAGNOSIS — Z79810 Long term (current) use of selective estrogen receptor modulators (SERMs): Secondary | ICD-10-CM | POA: Insufficient documentation

## 2021-10-23 DIAGNOSIS — C773 Secondary and unspecified malignant neoplasm of axilla and upper limb lymph nodes: Secondary | ICD-10-CM | POA: Diagnosis not present

## 2021-10-23 DIAGNOSIS — K760 Fatty (change of) liver, not elsewhere classified: Secondary | ICD-10-CM | POA: Insufficient documentation

## 2021-10-23 LAB — CBC WITH DIFFERENTIAL (CANCER CENTER ONLY)
Abs Immature Granulocytes: 0.01 10*3/uL (ref 0.00–0.07)
Basophils Absolute: 0 10*3/uL (ref 0.0–0.1)
Basophils Relative: 0 %
Eosinophils Absolute: 0.3 10*3/uL (ref 0.0–0.5)
Eosinophils Relative: 6 %
HCT: 42.2 % (ref 36.0–46.0)
Hemoglobin: 13.6 g/dL (ref 12.0–15.0)
Immature Granulocytes: 0 %
Lymphocytes Relative: 30 %
Lymphs Abs: 1.6 10*3/uL (ref 0.7–4.0)
MCH: 32.2 pg (ref 26.0–34.0)
MCHC: 32.2 g/dL (ref 30.0–36.0)
MCV: 99.8 fL (ref 80.0–100.0)
Monocytes Absolute: 0.6 10*3/uL (ref 0.1–1.0)
Monocytes Relative: 11 %
Neutro Abs: 2.9 10*3/uL (ref 1.7–7.7)
Neutrophils Relative %: 53 %
Platelet Count: 146 10*3/uL — ABNORMAL LOW (ref 150–400)
RBC: 4.23 MIL/uL (ref 3.87–5.11)
RDW: 12.9 % (ref 11.5–15.5)
WBC Count: 5.4 10*3/uL (ref 4.0–10.5)
nRBC: 0 % (ref 0.0–0.2)

## 2021-10-23 LAB — CMP (CANCER CENTER ONLY)
ALT: 30 U/L (ref 0–44)
AST: 28 U/L (ref 15–41)
Albumin: 3.9 g/dL (ref 3.5–5.0)
Alkaline Phosphatase: 80 U/L (ref 38–126)
Anion gap: 8 (ref 5–15)
BUN: 12 mg/dL (ref 6–20)
CO2: 27 mmol/L (ref 22–32)
Calcium: 9.8 mg/dL (ref 8.9–10.3)
Chloride: 105 mmol/L (ref 98–111)
Creatinine: 0.84 mg/dL (ref 0.44–1.00)
GFR, Estimated: >60 mL/min (ref >60)
Glucose, Bld: 104 mg/dL — ABNORMAL HIGH (ref 70–99)
Potassium: 4.4 mmol/L (ref 3.5–5.1)
Sodium: 140 mmol/L (ref 135–145)
Total Bilirubin: 0.3 mg/dL (ref 0.3–1.2)
Total Protein: 6.5 g/dL (ref 6.5–8.1)

## 2021-10-23 MED ORDER — TAMOXIFEN CITRATE 20 MG PO TABS: 20.0000 mg | ORAL_TABLET | Freq: Every day | ORAL | 4 refills | Status: DC

## 2022-04-06 ENCOUNTER — Other Ambulatory Visit: Payer: Self-pay

## 2022-04-06 MED ORDER — TAMOXIFEN CITRATE 20 MG PO TABS: 20.0000 mg | ORAL_TABLET | Freq: Every day | ORAL | 2 refills | Status: DC

## 2022-04-06 NOTE — Progress Notes (Signed)
Refill per MD ?

## 2022-04-07 ENCOUNTER — Other Ambulatory Visit: Payer: Self-pay | Admitting: Hematology and Oncology

## 2022-04-08 ENCOUNTER — Other Ambulatory Visit: Payer: Self-pay | Admitting: *Deleted

## 2022-04-08 MED ORDER — TAMOXIFEN CITRATE 20 MG PO TABS: 20.0000 mg | ORAL_TABLET | Freq: Every day | ORAL | 2 refills | Status: DC

## 2022-04-10 ENCOUNTER — Other Ambulatory Visit: Payer: Self-pay | Admitting: *Deleted

## 2022-09-11 ENCOUNTER — Encounter: Payer: Self-pay | Admitting: Hematology and Oncology

## 2022-10-23 ENCOUNTER — Other Ambulatory Visit: Payer: Self-pay | Admitting: *Deleted

## 2022-10-23 DIAGNOSIS — Z170 Estrogen receptor positive status [ER+]: Secondary | ICD-10-CM

## 2022-10-26 ENCOUNTER — Encounter: Payer: Self-pay | Admitting: Hematology and Oncology

## 2022-10-26 ENCOUNTER — Inpatient Hospital Stay: Payer: Managed Care, Other (non HMO) | Attending: Hematology and Oncology

## 2022-10-26 ENCOUNTER — Other Ambulatory Visit: Payer: Self-pay

## 2022-10-26 ENCOUNTER — Inpatient Hospital Stay: Payer: Managed Care, Other (non HMO) | Admitting: Hematology and Oncology

## 2022-10-26 VITALS — BP 130/82 | HR 78 | Temp 97.8°F | Resp 16 | Ht 68.0 in | Wt 145.7 lb

## 2022-10-26 DIAGNOSIS — K760 Fatty (change of) liver, not elsewhere classified: Secondary | ICD-10-CM | POA: Insufficient documentation

## 2022-10-26 DIAGNOSIS — Z79899 Other long term (current) drug therapy: Secondary | ICD-10-CM | POA: Diagnosis not present

## 2022-10-26 DIAGNOSIS — Z170 Estrogen receptor positive status [ER+]: Secondary | ICD-10-CM | POA: Diagnosis not present

## 2022-10-26 DIAGNOSIS — M8580 Other specified disorders of bone density and structure, unspecified site: Secondary | ICD-10-CM | POA: Insufficient documentation

## 2022-10-26 DIAGNOSIS — Z803 Family history of malignant neoplasm of breast: Secondary | ICD-10-CM | POA: Insufficient documentation

## 2022-10-26 DIAGNOSIS — C50512 Malignant neoplasm of lower-outer quadrant of left female breast: Secondary | ICD-10-CM

## 2022-10-26 DIAGNOSIS — Z853 Personal history of malignant neoplasm of breast: Secondary | ICD-10-CM | POA: Diagnosis present

## 2022-10-26 DIAGNOSIS — Z9012 Acquired absence of left breast and nipple: Secondary | ICD-10-CM | POA: Diagnosis not present

## 2022-10-26 DIAGNOSIS — K219 Gastro-esophageal reflux disease without esophagitis: Secondary | ICD-10-CM | POA: Diagnosis not present

## 2022-10-26 LAB — CMP (CANCER CENTER ONLY)
ALT: 25 U/L (ref 0–44)
AST: 29 U/L (ref 15–41)
Albumin: 4.3 g/dL (ref 3.5–5.0)
Alkaline Phosphatase: 66 U/L (ref 38–126)
Anion gap: 4 — ABNORMAL LOW (ref 5–15)
BUN: 16 mg/dL (ref 8–23)
CO2: 31 mmol/L (ref 22–32)
Calcium: 10.4 mg/dL — ABNORMAL HIGH (ref 8.9–10.3)
Chloride: 106 mmol/L (ref 98–111)
Creatinine: 0.9 mg/dL (ref 0.44–1.00)
GFR, Estimated: >60 mL/min (ref >60)
Glucose, Bld: 106 mg/dL — ABNORMAL HIGH (ref 70–99)
Potassium: 4.5 mmol/L (ref 3.5–5.1)
Sodium: 141 mmol/L (ref 135–145)
Total Bilirubin: 0.5 mg/dL (ref 0.3–1.2)
Total Protein: 6.6 g/dL (ref 6.5–8.1)

## 2022-10-26 LAB — CBC WITH DIFFERENTIAL (CANCER CENTER ONLY)
Abs Immature Granulocytes: 0.01 10*3/uL (ref 0.00–0.07)
Basophils Absolute: 0 10*3/uL (ref 0.0–0.1)
Basophils Relative: 1 %
Eosinophils Absolute: 0.3 10*3/uL (ref 0.0–0.5)
Eosinophils Relative: 6 %
HCT: 41.5 % (ref 36.0–46.0)
Hemoglobin: 13.8 g/dL (ref 12.0–15.0)
Immature Granulocytes: 0 %
Lymphocytes Relative: 36 %
Lymphs Abs: 1.9 10*3/uL (ref 0.7–4.0)
MCH: 32.9 pg (ref 26.0–34.0)
MCHC: 33.3 g/dL (ref 30.0–36.0)
MCV: 99 fL (ref 80.0–100.0)
Monocytes Absolute: 0.6 10*3/uL (ref 0.1–1.0)
Monocytes Relative: 12 %
Neutro Abs: 2.5 10*3/uL (ref 1.7–7.7)
Neutrophils Relative %: 45 %
Platelet Count: 140 10*3/uL — ABNORMAL LOW (ref 150–400)
RBC: 4.19 MIL/uL (ref 3.87–5.11)
RDW: 12.9 % (ref 11.5–15.5)
WBC Count: 5.4 10*3/uL (ref 4.0–10.5)
nRBC: 0 % (ref 0.0–0.2)

## 2022-10-26 NOTE — Progress Notes (Signed)
Sydney Cervantes   DOB: 29-Jan-1961  MR#: [id number]  QBH#:[id number]  Patient Care Team: Hayden Rasmussen, MD as PCP - General (Family Medicine) Avon Gully, NP as Nurse Practitioner (Obstetrics and Gynecology) Benay Pike, MD as Medical Oncologist (Hematology and Oncology) OTHER MD: Crissie Reese, MD  CHIEF COMPLAINT: Estrogen receptor positive breast cancer (s/p left mastectomy)  CURRENT TREATMENT: Tamoxifen  INTERVAL HISTORY:  Sydney Cervantes returns today for follow-up of her estrogen receptor positive breast cancer. Mammogram done in October, BIRADS 1 category negative. She is ready to come off of tamoxifen.  She tells me that it does not cause her much trouble except for some thinning of hair.  She otherwise appears to be in good health.  She had a few questions today for exam tells me that she feels reassurance if she can continue annual follow-up.  REVIEW OF SYSTEMS: Dezire is not currently exercising regularly.  She does do some yoga.  A detailed review of systems was otherwise entirely benign   COVID 19 VACCINATION STATUS: Status post Pfizer x4, also had COVID May 2022   BREAST CANCER HISTORY: From Dr. Rada Hay prior summary note:  1. 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  ER/PR positive at 63/5% respectively, HER-2 negative, elevated Ki-67. Completed 4/6 planned neoadjuvant dose dense FEC with Neulasta support on day 2. 20% dose reduction on 5-FU, 50% dose reduction on epirubicin, 40% dose reduction on Cytoxan, after one-week delay due to anemia/thrombocytopenia/and region of probable localized thrombophlebitis versus mild chemotherapy extravasation on the left forearm, covered with Keflex 500 mg by mouth 3 times a day. Switched to single agent neoadjuvant Taxol, given 3 weeks on 1 week off, completed 4 cycles on 08/02/2012.   2. Status post left modified radical  mastectomy on 08/25/2012 with final pathology showing no tumor in the primary tumor bed with 7 of 8 involved lymph nodes. The final pathology showed an ER positive tumor, HER-2 negative.  She completed radiation on 11/18/12 and started taking Tamoxifen on 11/21/2012.  It is also noted that she is BRCA negative.   Her subsequent history is as detailed below   PAST MEDICAL HISTORY: Past Medical History:  Diagnosis Date   Anemia    "chemo induced" (08/25/2012)   Breast cancer ([place])    left   Carcinoid tumor of colon, benign    partial colectomy ,1987   GERD (gastroesophageal reflux disease)    occ; "side effect of chemo" (08/25/2012)   Hepatic steatosis 12/27/2013   Hx of colonoscopy 2000   Hx of radiation therapy 09/21/12 -11/17/12   R supraclav fossa, L chest wall/SCVL fossa/PAB mid axilla/IM nodes/scar boost   Seasonal allergies    "when molds and things come out; pollens" (08/25/2012)   Wears glasses     PAST SURGICAL HISTORY: Past Surgical History:  Procedure Laterality Date   [place]   Carcinoid Tumor of Appendix   BREAST BIOPSY  01/2012   left   BREAST REDUCTION SURGERY Right 01/11/2014   Procedure: RIGHT BREAST REDUCTION ;  Surgeon: Crissie Reese, MD;  Location: [facility];  Service: Plastics;  Laterality: Right;   CHOLECYSTECTOMY  2003   COLONOSCOPY N/A 05/11/2014   Procedure: COLONOSCOPY;  Surgeon: Shann Medal, MD;  Location: [facility];  Service: General;  Laterality: N/A;   DILATION AND CURETTAGE OF UTERUS  1992   LATISSIMUS FLAP TO BREAST Left  01/11/2014   Procedure: LEFT LATISSIMUS FLAP TO BREAST WITH SALINE IMPLANT FOR RECONSTRUCTION;  Surgeon: Crissie Reese, MD;  Location: [facility];  Service: Plastics;  Laterality: Left;   MASTECTOMY MODIFIED RADICAL  08/25/2012   left   PORTACATH PLACEMENT  02/11/2012   Procedure: INSERTION PORT-A-CATH;  Surgeon: Shann Medal, MD;  Location: [facility];  Service: General;  Laterality: Right;  Subclavian     FAMILY HISTORY Family History  Problem Relation Age of Onset   Breast cancer Mother    Breast cancer Paternal Aunt    Cancer Maternal Grandmother        Hodgkins Disease    GYNECOLOGIC HISTORY:   G3P2.  Menarche at 61 years of age.  Followed by [name], NP.     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 Scott works in Engineer, technical sales for [employer]. Daughter Joellen [place] currently 69 years old is a Environmental education officer in psychology and exercise physiology hoping to become an occupational therapist. Daughter Raquel Sarna studied Therapist, occupational at [employer]	[place] and hopes to be in [place] ; she is currently living at home with the patient. Daughter Burman Nieves is currently in [place] [employer].   ADVANCED DIRECTIVES: In the absence of any documentation to the contrary, the patient's spouse is their HCPOA.    HEALTH MAINTENANCE:   Social History   Socioeconomic History   Marital status: Married    Spouse name: Not on file   Number of children: Not on file   Years of education: Not on file   Highest education level: Not on file  Occupational History   Not on file  Tobacco Use   Smoking status: Never   Smokeless tobacco: Never  Substance and Sexual Activity   Alcohol use: Yes    Alcohol/week: 7.0 - 10.0 standard drinks of alcohol    Types: 7 - 10 Glasses of wine per week    Comment: 06/26/2013 - 1-1.5 glasses of wine nightly   Drug use: No   Sexual activity: Yes  Other Topics Concern   Not on file  Social History Narrative   Not on file   Social Determinants of Health   Financial Resource Strain: Not on file  Food Insecurity: Not on file  Transportation Needs: Not on file  Physical Activity: Not on file  Stress: Not on file  Social Connections: Not on file  Intimate Partner Violence: Not on file      COLONOSCOPY: Approx 2000  PAP:  Not on file  BONE DENSITY:  08/2020, -2.2  LIPIDS:  Not on file    Allergies  Allergen Reactions   Adhesive [Tape] Itching and Rash    Rash and itching at steristrip and EKG lead pad sites   Codeine     Headache    Current Outpatient Medications  Medication Sig Dispense Refill   Calcium Carbonate-Vitamin D (CALTRATE 600+D PO) Take 1 tablet by mouth daily.      cholecalciferol (VITAMIN D) 1000 units tablet Take 1 tablet (1,000 Units total) by mouth daily.     Cyanocobalamin (B-12) 1000 MCG CAPS Take by mouth.     Krill Oil 300 MG CAPS Take by mouth.     Multiple Vitamins-Minerals (MULTIVITAMIN) tablet Take 1 tablet by mouth daily.     Probiotic Product (PROBIOTIC DAILY PO) Take 2 capsules by mouth daily.  tamoxifen (NOLVADEX) 20 MG tablet Take 1 tablet (20 mg total) by mouth daily. 90 tablet 2   No current facility-administered medications for this visit.   OBJECTIVE: White woman who appears younger than stated age  61:   10/26/22 0947  BP: 130/82  Pulse: 78  Resp: 16  Temp: 97.8 F (36.6 C)  SpO2: 100%    Body mass index is 22.15 kg/m. ECOG FS: 0 - Asymptomatic  Physical Exam Constitutional:      Appearance: Normal appearance.  Chest:     Comments: Left breast status postmastectomy.  No concern for local recurrence.  Right breast status postreduction.  No palpable masses or regional adenopathy Musculoskeletal:        General: No swelling.     Cervical back: Normal range of motion and neck supple. No rigidity.  Lymphadenopathy:     Cervical: No cervical adenopathy.  Neurological:     Mental Status: She is alert.       LAB RESULTS:  CBC    Component Value Date/Time   WBC 5.4 10/26/2022 0858   WBC 4.4 09/29/2017 1009   WBC 5.4 01/02/2014 1337   RBC 4.19 10/26/2022 0858   HGB 13.8 10/26/2022 0858   HGB 14.4 09/29/2017 1009   HCT 41.5 10/26/2022 0858   HCT 43.4 09/29/2017 1009   PLT 140 (L) 10/26/2022 0858    PLT 133 (L) 09/29/2017 1009   MCV 99.0 10/26/2022 0858   MCV 99.1 09/29/2017 1009   MCH 32.9 10/26/2022 0858   MCHC 33.3 10/26/2022 0858   RDW 12.9 10/26/2022 0858   RDW 13.4 09/29/2017 1009   LYMPHSABS 1.9 10/26/2022 0858   LYMPHSABS 1.3 09/29/2017 1009   MONOABS 0.6 10/26/2022 0858   MONOABS 0.5 09/29/2017 1009   EOSABS 0.3 10/26/2022 0858   EOSABS 0.2 09/29/2017 1009   BASOSABS 0.0 10/26/2022 0858   BASOSABS 0.0 09/29/2017 1009      Latest Ref Rng & Units 10/26/2022    8:58 AM 10/23/2021    9:26 AM 10/22/2020    9:39 AM  CMP  Glucose 70 - 99 mg/dL 106  104  95   BUN 8 - 23 mg/dL _0 Creatinine 0.44 - 1.00 mg/dL 0.90  0.84  0.83   Sodium 135 - 145 mmol/L 141  140  141   Potassium 3.5 - 5.1 mmol/L 4.5  4.4  4.0   Chloride 98 - 111 mmol/L 106  105  107   CO2 22 - 32 mmol/L _1 Calcium 8.9 - 10.3 mg/dL 10.4  9.8  9.8   Total Protein 6.5 - 8.1 g/dL 6.6  6.5  6.6   Total Bilirubin 0.3 - 1.2 mg/dL 0.5  0.3  0.6   Alkaline Phos 38 - 126 U/L 66  80  74   AST 15 - 41 U/L _2 ALT 0 - 44 U/L _3 STUDIES: No results found.   ASSESSMENT:  61 y.o. BRCA negative [place] woman  (1) s/p left breast lower outer quadrant and left axillary lymph node biopsies 01/18/2012 for a clinical T4 N3, stage IIIC invasive lobular carcinoma, grade 3, estrogen receptor 62% positive, progesterone receptor 5% positive, with an MIB-1 of 33% and no HER-2 amplification.  (2) Began chemotherapy in March 2013, and received 4/6 planned neoadjuvant cycles of dose dense cyclophosphamide, epirubicin and fluorouracil followed by weekly  paclitaxel x 12 completed Taxol 08/02/2012.   (3) left modified radical mastectomy 08/25/2012 showed no residual tumor in the breast, but 7/8 sampled lymph nodes were positive, 6 with macro metastases. Repeat HER-2 was again negative.  (4) She completed radiation on 11/18/12   (5) started tamoxifen on 11/21/2012  (a) opted against  switching to aromatase inhibitors  (6) on 01/11/2014 underwent left latissimus dorsi myocutaneous flap reconstruction of the left breast, with saline implant, and right breast mammoplasty for symmetry  (7) history of a cecal carcinoid resected 1987, with an unremarkable colonoscopy 05/14/2014  (8) hepatic steatosis, with occasional AST elevation: no trend noted  (9) osteopenia -   (a) T-score   -1.9 on dexa  12/21/14 at [facility]  (b) repeat bone density 08/24/2018 T score -1.8  (c) repeat bone density 09/04/2020 shows a T score of -2.2   PLAN:  She is now been on tamoxifen for almost 10 years.  Although she has locally advanced invasive lobular carcinoma, there is no real role of doing extended adjuvant antiestrogen therapy beyond 10 years especially given the risks versus benefits.  She is okay to discontinue this.  She is excited that her hair might grow back when she stops tamoxifen. Most recent mammogram in October 2023 unremarkable. No concerns for recurrence on physical exam. She wants to continue follow-up annually.  She will return to clinic in 1 year or sooner as needed Encouraged exercise, self breast exam. Mild hypercalcemia, can discontinue ca supplement. Total time spent: 30 minutes  *Total Encounter Time as defined by the Centers for Medicare and Medicaid Services includes, in addition to the face-to-face time of a patient visit (documented in the note above) non-face-to-face time: obtaining and reviewing outside history, ordering and reviewing medications, tests or procedures, care coordination (communications with other health care professionals or caregivers) and documentation in the medical record.

## 2023-07-03 ENCOUNTER — Ambulatory Visit
Admission: EM | Admit: 2023-07-03 | Discharge: 2023-07-03 | Disposition: A | Discharge status: Home / Self Care | Payer: Managed Care, Other (non HMO)

## 2023-07-03 DIAGNOSIS — L03114 Cellulitis of left upper limb: Secondary | ICD-10-CM

## 2023-07-03 DIAGNOSIS — I890 Lymphedema, not elsewhere classified: Secondary | ICD-10-CM

## 2023-07-03 MED ORDER — CLINDAMYCIN HCL 300 MG PO CAPS: 300.0000 mg | ORAL_CAPSULE | Freq: Three times a day (TID) | ORAL | 0 refills | Status: DC

## 2023-07-03 NOTE — Discharge Instructions (Signed)
We are going to cover for a skin infection.  Start clindamycin 3 times daily.  This can upset your stomach so take it with food.  If you develop any severe diarrhea please stop the medication and be seen immediately.  Monitor the area of redness and if this spreads please be seen again.  If anything worsens and you have rapid spread of redness, fever, nausea, vomiting, body aches, feeling very poorly please be seen immediately.  Follow-up with your primary care soon as possible.

## 2023-07-03 NOTE — ED Triage Notes (Addendum)
Pt c/o rash, pt states sshe thinks she may have cellulitis in her left arm she has a hx's of chronic lymphedema. Pt woke up this morning to a set of red dots along the inside of her left arm.

## 2023-07-03 NOTE — ED Provider Notes (Signed)
[facility]    CSN: [id number] Arrival date & time: 07/03/23  1008      History   Chief Complaint No chief complaint on file.   HPI Sydney Cervantes is a 62 y.o. female.   Patient presents today with a 1 day history of erythema, rash, tightness in her left arm.  She is status post radical mastectomy with axillary dissection and has chronic lymphedema of the right arm.  As a result of this she has had cellulitis several times in the past and states that she is concerned for the beginning of that based on her presentation today.  She denies any significant pain or pruritus.  Denies any changes to her personal hygiene products.  She denies any recent antibiotics in the past 90 days.  She has no specific concern for MRSA and denies history of recurrent skin infections outside of cellulitis every few years related to the lymphedema.  She denies any recent hospitalization or surgical procedure.  Denies any fever, chest pain, shortness of breath, palpitations, nausea, vomiting.  Reports that she can take but does not tolerate Bactrim or doxycycline very well as they cause significant side effects and so is requesting something else if appropriate.  She has completed treatment and is not currently receiving chemotherapy.    Past Medical History:  Diagnosis Date   Anemia    "chemo induced" (08/25/2012)   Breast cancer (HCC)    left   Carcinoid tumor of colon, benign    partial colectomy ,1987   GERD (gastroesophageal reflux disease)    occ; "side effect of chemo" (08/25/2012)   Hepatic steatosis 12/27/2013   Hx of colonoscopy 2000   Hx of radiation therapy 09/21/12 -11/17/12   R supraclav fossa, L chest wall/SCVL fossa/PAB mid axilla/IM nodes/scar boost   Seasonal allergies    "when molds and things come out; pollens" (08/25/2012)   Wears glasses     Patient Active Problem List   Diagnosis Date Noted   Osteopenia 01/17/2015   Hepatic steatosis 12/27/2013   Malignant neoplasm of  lower-outer quadrant of left breast of female, estrogen receptor positive (HCC) 09/26/2013   Elevated liver enzymes 06/26/2013   Wears glasses    Seasonal allergies    Hx of radiation therapy     Past Surgical History:  Procedure Laterality Date   APPENDECTOMY  1987   BOWEL RESECTION  1987   Carcinoid Tumor of Appendix   BREAST BIOPSY  01/2012   left   BREAST REDUCTION SURGERY Right 01/11/2014   Procedure: RIGHT BREAST REDUCTION ;  Surgeon: Etter Sjogren, MD;  Location: [facility];  Service: Plastics;  Laterality: Right;   CHOLECYSTECTOMY  2003   COLONOSCOPY N/A 05/11/2014   Procedure: COLONOSCOPY;  Surgeon: Kandis Cocking, MD;  Location: [facility];  Service: General;  Laterality: N/A;   DILATION AND CURETTAGE OF UTERUS  1992   LATISSIMUS FLAP TO BREAST Left 01/11/2014   Procedure: LEFT LATISSIMUS FLAP TO BREAST WITH SALINE IMPLANT FOR RECONSTRUCTION;  Surgeon: Etter Sjogren, MD;  Location: [facility] OR;  Service: Plastics;  Laterality: Left;   MASTECTOMY MODIFIED RADICAL  08/25/2012   left   PORTACATH PLACEMENT  02/11/2012   Procedure: INSERTION PORT-A-CATH;  Surgeon: Kandis Cocking, MD;  Location: [facility] OR;  Service: General;  Laterality: Right;  Subclavian    OB History     Gravida  4   Para  4   Term      Preterm  AB      Living         SAB      IAB      Ectopic      Multiple      Live Births           Obstetric Comments  Menarche age 75, G58 P3, BC x 15, No HRT, chemo induced menopause          Home Medications    Prior to Admission medications   Medication Sig Start Date End Date Taking? Authorizing Provider  Calcium-Vitamin D-Vitamin K (CALCIUM+MENAQ7) [phone] MG-UNT-MCG TABS Take by mouth. 01/30/20  Yes [provider]  clindamycin (CLEOCIN) 300 MG capsule Take 1 capsule (300 mg total) by mouth 3 (three) times daily. 07/03/23  Yes Shlomo Seres, Denny Peon K, PA-C  Calcium Carbonate-Vitamin D (CALTRATE 600+D PO) Take 1 tablet by mouth daily.     [provider]  cholecalciferol (VITAMIN D) 1000 units tablet Take 1 tablet (1,000 Units total) by mouth daily. 09/29/18   Magrinat, Valentino Hue, MD  Cyanocobalamin (B-12) 1000 MCG CAPS Take by mouth. 09/29/18   Magrinat, Valentino Hue, MD  Krill Oil 300 MG CAPS Take by mouth. 10/23/21   Magrinat, Valentino Hue, MD  Multiple Vitamins-Minerals (MULTIVITAMIN) tablet Take 1 tablet by mouth daily. 10/23/21   Magrinat, Valentino Hue, MD  Probiotic Product (PROBIOTIC DAILY PO) Take 2 capsules by mouth daily.    [provider]    Family History Family History  Problem Relation Age of Onset   Breast cancer Mother    Breast cancer Paternal Aunt    Cancer Maternal Grandmother        Hodgkins Disease    Social History Social History   Tobacco Use   Smoking status: Never   Smokeless tobacco: Never  Substance Use Topics   Alcohol use: Yes    Alcohol/week: 7.0 - 10.0 standard drinks of alcohol    Types: 7 - 10 Glasses of wine per week    Comment: 06/26/2013 - 1-1.5 glasses of wine nightly   Drug use: No     Allergies   Adhesive [tape] and Codeine   Review of Systems Review of Systems  Constitutional:  Negative for activity change, appetite change, fatigue and fever.  Gastrointestinal:  Negative for abdominal pain, diarrhea, nausea and vomiting.  Musculoskeletal:  Negative for arthralgias and myalgias.  Skin:  Positive for color change and rash. Negative for wound.  Neurological:  Negative for dizziness, weakness, light-headedness, numbness and headaches.     Physical Exam Triage Vital Signs ED Triage Vitals  Encounter Vitals Group     BP 07/03/23 1014 123/81     Systolic BP Percentile --      Diastolic BP Percentile --      Pulse Rate 07/03/23 1014 (!) 106     Resp 07/03/23 1014 14     Temp 07/03/23 1014 98.2 F (36.8 C)     Temp Source 07/03/23 1014 Oral     SpO2 07/03/23 1014 95 %     Weight --      Height --      Head Circumference --      Peak Flow --      Pain Score  07/03/23 1017 0     Pain Loc --      Pain Education --      Exclude from Growth Chart --    No data found.  Updated Vital Signs BP 123/81 (BP Location:  Right Arm)   Pulse 83   Temp 98.2 F (36.8 C) (Oral)   Resp 14   LMP 01/03/2012   SpO2 98%   Visual Acuity Right Eye Distance:   Left Eye Distance:   Bilateral Distance:    Right Eye Near:   Left Eye Near:    Bilateral Near:     Physical Exam Vitals reviewed.  Constitutional:      General: She is awake. She is not in acute distress.    Appearance: Normal appearance. She is well-developed. She is not ill-appearing.     Comments: Very pleasant female appears stated age in no acute distress sitting comfortably in exam room  HENT:     Head: Normocephalic and atraumatic.  Cardiovascular:     Rate and Rhythm: Normal rate and regular rhythm.     Heart sounds: Normal heart sounds, S1 normal and S2 normal. No murmur heard. Pulmonary:     Effort: Pulmonary effort is normal.     Breath sounds: Normal breath sounds. No wheezing, rhonchi or rales.     Comments: Clear to auscultation bilaterally Abdominal:     Palpations: Abdomen is soft.     Tenderness: There is no abdominal tenderness.  Skin:    Findings: Rash present. Rash is papular.     Comments: Papular erythematous rash noted left upper and lower arm.  No streaking or evidence of lymphangitis.  No abscess or drainable fluid collection.  No active bleeding or drainage noted.  Psychiatric:        Behavior: Behavior is cooperative.      UC Treatments / Results  Labs (all labs ordered are listed, but only abnormal results are displayed) Labs Reviewed - No data to display  EKG   Radiology No results found.  Procedures Procedures (including critical care time)  Medications Ordered in UC Medications - No data to display  Initial Impression / Assessment and Plan / UC Course  I have reviewed the triage vital signs and the nursing notes.  Pertinent labs & imaging  results that were available during my care of the patient were reviewed by me and considered in my medical decision making (see chart for details).     Patient was initially tachycardic but did not sound to be on exam and so when this was rechecked her heart rate was noted to be normal.  She is well-appearing, afebrile, nontoxic.  Concern for recurrent cellulitis related to chronic lymphedema.  She was started on clindamycin 3 times daily we discussed that this can be upsetting to her stomach so she should take it with food.  If she develops any severe abdominal upset including severe diarrhea she should stop the medication to be seen immediately.  Recommend that she monitor area of erythema and rash if this continues spread despite medication she should be reevaluated.  We discussed that if she has any worsening symptoms including fever, nausea, vomiting, rapid expansion of erythema, malaise, body aches she needs to be seen emergently.  Strict return precautions given.  Recommend close follow-up with her primary care.  Final Clinical Impressions(s) / UC Diagnoses   Final diagnoses:  Cellulitis of left arm  Lymphedema of left arm     Discharge Instructions      We are going to cover for a skin infection.  Start clindamycin 3 times daily.  This can upset your stomach so take it with food.  If you develop any severe diarrhea please stop the medication and be seen immediately.  Monitor the area of redness and if this spreads please be seen again.  If anything worsens and you have rapid spread of redness, fever, nausea, vomiting, body aches, feeling very poorly please be seen immediately.  Follow-up with your primary care soon as possible.     ED Prescriptions     Medication Sig Dispense Auth. Provider   clindamycin (CLEOCIN) 300 MG capsule Take 1 capsule (300 mg total) by mouth 3 (three) times daily. 30 capsule Katarina Riebe K, PA-C      PDMP not reviewed this encounter.   Jeani Hawking,  PA-C 07/03/23 1035

## 2023-09-21 ENCOUNTER — Encounter: Payer: Self-pay | Admitting: Hematology and Oncology

## 2023-10-26 ENCOUNTER — Other Ambulatory Visit: Payer: Self-pay | Admitting: Hematology and Oncology

## 2023-10-26 DIAGNOSIS — Z170 Estrogen receptor positive status [ER+]: Secondary | ICD-10-CM

## 2023-10-27 ENCOUNTER — Inpatient Hospital Stay: Payer: Managed Care, Other (non HMO) | Attending: Hematology and Oncology

## 2023-10-27 ENCOUNTER — Inpatient Hospital Stay: Payer: Managed Care, Other (non HMO) | Admitting: Hematology and Oncology

## 2023-10-27 VITALS — BP 115/77 | HR 97 | Temp 97.4°F | Resp 16 | Wt 133.0 lb

## 2023-10-27 DIAGNOSIS — C50512 Malignant neoplasm of lower-outer quadrant of left female breast: Secondary | ICD-10-CM | POA: Diagnosis not present

## 2023-10-27 DIAGNOSIS — D751 Secondary polycythemia: Secondary | ICD-10-CM | POA: Diagnosis not present

## 2023-10-27 DIAGNOSIS — Z9221 Personal history of antineoplastic chemotherapy: Secondary | ICD-10-CM | POA: Diagnosis not present

## 2023-10-27 DIAGNOSIS — Z923 Personal history of irradiation: Secondary | ICD-10-CM | POA: Insufficient documentation

## 2023-10-27 DIAGNOSIS — Z170 Estrogen receptor positive status [ER+]: Secondary | ICD-10-CM

## 2023-10-27 DIAGNOSIS — M8580 Other specified disorders of bone density and structure, unspecified site: Secondary | ICD-10-CM | POA: Diagnosis not present

## 2023-10-27 DIAGNOSIS — Z9012 Acquired absence of left breast and nipple: Secondary | ICD-10-CM | POA: Diagnosis not present

## 2023-10-27 DIAGNOSIS — Z853 Personal history of malignant neoplasm of breast: Secondary | ICD-10-CM | POA: Insufficient documentation

## 2023-10-27 LAB — CMP (CANCER CENTER ONLY)
ALT: 15 U/L (ref 0–44)
AST: 22 U/L (ref 15–41)
Albumin: 4.2 g/dL (ref 3.5–5.0)
Alkaline Phosphatase: 85 U/L (ref 38–126)
Anion gap: 6 (ref 5–15)
BUN: 19 mg/dL (ref 8–23)
CO2: 31 mmol/L (ref 22–32)
Calcium: 10.3 mg/dL (ref 8.9–10.3)
Chloride: 102 mmol/L (ref 98–111)
Creatinine: 0.84 mg/dL (ref 0.44–1.00)
GFR, Estimated: >60 mL/min (ref >60)
Glucose, Bld: 100 mg/dL — ABNORMAL HIGH (ref 70–99)
Potassium: 4 mmol/L (ref 3.5–5.1)
Sodium: 139 mmol/L (ref 135–145)
Total Bilirubin: 0.6 mg/dL (ref <1.2)
Total Protein: 6.7 g/dL (ref 6.5–8.1)

## 2023-10-27 LAB — CBC WITH DIFFERENTIAL (CANCER CENTER ONLY)
Abs Immature Granulocytes: 0.01 10*3/uL (ref 0.00–0.07)
Basophils Absolute: 0.1 10*3/uL (ref 0.0–0.1)
Basophils Relative: 1 %
Eosinophils Absolute: 0.2 10*3/uL (ref 0.0–0.5)
Eosinophils Relative: 3 %
HCT: 44.5 % (ref 36.0–46.0)
Hemoglobin: 15.3 g/dL — ABNORMAL HIGH (ref 12.0–15.0)
Immature Granulocytes: 0 %
Lymphocytes Relative: 27 %
Lymphs Abs: 1.6 10*3/uL (ref 0.7–4.0)
MCH: 33 pg (ref 26.0–34.0)
MCHC: 34.4 g/dL (ref 30.0–36.0)
MCV: 96.1 fL (ref 80.0–100.0)
Monocytes Absolute: 0.6 10*3/uL (ref 0.1–1.0)
Monocytes Relative: 11 %
Neutro Abs: 3.4 10*3/uL (ref 1.7–7.7)
Neutrophils Relative %: 58 %
Platelet Count: 160 10*3/uL (ref 150–400)
RBC: 4.63 MIL/uL (ref 3.87–5.11)
RDW: 13 % (ref 11.5–15.5)
WBC Count: 5.8 10*3/uL (ref 4.0–10.5)
nRBC: 0 % (ref 0.0–0.2)

## 2023-10-27 NOTE — Progress Notes (Signed)
Sydney Cervantes   DOB: 1961/08/17  MR#: [id number]  XNA#:[id number]  Patient Care Team: Dois Davenport, MD as PCP - General (Family Medicine) Lynden Ang, NP as Nurse Practitioner (Obstetrics and Gynecology) Rachel Moulds, MD as Medical Oncologist (Hematology and Oncology) OTHER MD: Etter Sjogren, MD  CHIEF COMPLAINT: Estrogen receptor positive breast cancer (s/p left mastectomy)  CURRENT TREATMENT: Observation  INTERVAL HISTORY:  Sydney Cervantes returns today for follow-up of her estrogen receptor positive breast cancer.  She completed 10 years of adjuvant tamoxifen.  She is currently on observation alone. Since her last visit here, she had a basal cell carcinoma identified in the right chest wall which is to be excised. Other than this, she has had no health issues. She continues to do yoga regularly. Mammogram done in October, BIRADS 1 category negative. Rest of the pertinent 10 point ROS reviewed and negative   COVID 19 VACCINATION STATUS: Status post Pfizer x4, also had COVID May 2022   BREAST CANCER HISTORY: From Dr. Doreatha Massed prior summary note:  1. 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  ER/PR positive at 63/5% respectively, HER-2 negative, elevated Ki-67. Completed 4/6 planned neoadjuvant dose dense FEC with Neulasta support on day 2. 20% dose reduction on 5-FU, 50% dose reduction on epirubicin, 40% dose reduction on Cytoxan, after one-week delay due to anemia/thrombocytopenia/and region of probable localized thrombophlebitis versus mild chemotherapy extravasation on the left forearm, covered with Keflex 500 mg by mouth 3 times a day. Switched to single agent neoadjuvant Taxol, given 3 weeks on 1 week off, completed 4 cycles on 08/02/2012.   2. Status post left modified radical mastectomy on 08/25/2012 with final pathology showing no tumor in the primary tumor bed with 7 of 8  involved lymph nodes. The final pathology showed an ER positive tumor, HER-2 negative.  She completed radiation on 11/18/12 and started taking Tamoxifen on 11/21/2012.  It is also noted that she is BRCA negative.   Her subsequent history is as detailed below   PAST MEDICAL HISTORY: Past Medical History:  Diagnosis Date   Anemia    "chemo induced" (08/25/2012)   Breast cancer (HCC)    left   Carcinoid tumor of colon, benign    partial colectomy ,1987   GERD (gastroesophageal reflux disease)    occ; "side effect of chemo" (08/25/2012)   Hepatic steatosis 12/27/2013   Hx of colonoscopy 2000   Hx of radiation therapy 09/21/12 -11/17/12   R supraclav fossa, L chest wall/SCVL fossa/PAB mid axilla/IM nodes/scar boost   Seasonal allergies    "when molds and things come out; pollens" (08/25/2012)   Wears glasses     PAST SURGICAL HISTORY: Past Surgical History:  Procedure Laterality Date   APPENDECTOMY  1987   BOWEL RESECTION  1987   Carcinoid Tumor of Appendix   BREAST BIOPSY  01/2012   left   BREAST REDUCTION SURGERY Right 01/11/2014   Procedure: RIGHT BREAST REDUCTION ;  Surgeon: Etter Sjogren, MD;  Location: [facility] OR;  Service: Plastics;  Laterality: Right;   CHOLECYSTECTOMY  2003   COLONOSCOPY N/A 05/11/2014   Procedure: COLONOSCOPY;  Surgeon: Kandis Cocking, MD;  Location: [facility];  Service: General;  Laterality: N/A;   DILATION AND CURETTAGE OF UTERUS  1992   LATISSIMUS FLAP TO BREAST Left 01/11/2014   Procedure: LEFT LATISSIMUS FLAP TO BREAST WITH SALINE IMPLANT FOR RECONSTRUCTION;  Surgeon: Etter Sjogren, MD;  Location: [facility] OR;  Service: Plastics;  Laterality: Left;   MASTECTOMY MODIFIED RADICAL  08/25/2012   left   PORTACATH PLACEMENT  02/11/2012   Procedure: INSERTION PORT-A-CATH;  Surgeon: Kandis Cocking, MD;  Location: [facility] OR;  Service: General;  Laterality: Right;  Subclavian    FAMILY HISTORY Family History  Problem Relation Age of Onset   Breast cancer Mother    Breast  cancer Paternal Aunt    Cancer Maternal Grandmother        Hodgkins Disease    GYNECOLOGIC HISTORY:   G3P2.  Menarche at 62 years of age.  Followed by Debbora Dus, NP.     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 Scott works in Consulting civil engineer for [employer]. Daughter [name] currently 57 years old is a Buyer, retail in psychology and exercise physiology hoping to become an occupational therapist. Daughter [name] studied Investment banker, corporate at [employer] and hopes to be in government ; she is currently living at home with the patient. Daughter [name] is currently in [place] studying music History.   ADVANCED DIRECTIVES: In the absence of any documentation to the contrary, the patient's spouse is their HCPOA.    HEALTH MAINTENANCE:   Social History   Socioeconomic History   Marital status: Married    Spouse name: Not on file   Number of children: Not on file   Years of education: Not on file   Highest education level: Not on file  Occupational History   Not on file  Tobacco Use   Smoking status: Never   Smokeless tobacco: Never  Substance and Sexual Activity   Alcohol use: Yes    Alcohol/week: 7.0 - 10.0 standard drinks of alcohol    Types: 7 - 10 Glasses of wine per week    Comment: 06/26/2013 - 1-1.5 glasses of wine nightly   Drug use: No   Sexual activity: Yes  Other Topics Concern   Not on file  Social History Narrative   Not on file   Social Determinants of Health   Financial Resource Strain: Not on file  Food Insecurity: Not on file  Transportation Needs: Not on file  Physical Activity: Not on file  Stress: Not on file  Social Connections: Not on file  Intimate Partner Violence: Not on file     COLONOSCOPY: Approx 2000  PAP:  Not on file  BONE DENSITY:  08/2020, -2.2  LIPIDS:  Not on  file    Allergies  Allergen Reactions   Adhesive [Tape] Itching and Rash    Rash and itching at steristrip and EKG lead pad sites   Codeine     Headache    Current Outpatient Medications  Medication Sig Dispense Refill   Calcium Carbonate-Vitamin D (CALTRATE 600+D PO) Take 1 tablet by mouth daily.      Calcium-Vitamin D-Vitamin K (CALCIUM+MENAQ7) [phone] MG-UNT-MCG TABS Take by mouth.     cholecalciferol (VITAMIN D) 1000 units tablet Take 1 tablet (1,000 Units total) by mouth daily.     clindamycin (CLEOCIN) 300 MG capsule Take 1 capsule (300 mg total) by mouth 3 (three) times daily. 30 capsule 0   Cyanocobalamin (B-12) 1000 MCG CAPS Take by mouth.     Krill Oil 300 MG CAPS Take by mouth.     Multiple Vitamins-Minerals (MULTIVITAMIN) tablet Take 1 tablet by mouth daily.  Probiotic Product (PROBIOTIC DAILY PO) Take 2 capsules by mouth daily.     No current facility-administered medications for this visit.   OBJECTIVE: White woman who appears younger than stated age  Vitals:   10/27/23 1229  BP: 115/77  Pulse: 97  Resp: 16  Temp: (!) 97.4 F (36.3 C)  SpO2: 100%    Body mass index is 20.22 kg/m. ECOG FS: 0 - Asymptomatic  Physical Exam Constitutional:      Appearance: Normal appearance.  Chest:     Comments: Left breast status postmastectomy.  No concern for local recurrence.  Right breast status postreduction.  No palpable masses or regional adenopathy Musculoskeletal:        General: No swelling.     Cervical back: Normal range of motion and neck supple. No rigidity.  Lymphadenopathy:     Cervical: No cervical adenopathy.  Neurological:     Mental Status: She is alert.       LAB RESULTS:  CBC    Component Value Date/Time   WBC 5.8 10/27/2023 1214   WBC 4.4 09/29/2017 1009   WBC 5.4 01/02/2014 1337   RBC 4.63 10/27/2023 1214   HGB 15.3 (H) 10/27/2023 1214   HGB 14.4 09/29/2017 1009   HCT 44.5 10/27/2023 1214   HCT 43.4 09/29/2017 1009   PLT  160 10/27/2023 1214   PLT 133 (L) 09/29/2017 1009   MCV 96.1 10/27/2023 1214   MCV 99.1 09/29/2017 1009   MCH 33.0 10/27/2023 1214   MCHC 34.4 10/27/2023 1214   RDW 13.0 10/27/2023 1214   RDW 13.4 09/29/2017 1009   LYMPHSABS 1.6 10/27/2023 1214   LYMPHSABS 1.3 09/29/2017 1009   MONOABS 0.6 10/27/2023 1214   MONOABS 0.5 09/29/2017 1009   EOSABS 0.2 10/27/2023 1214   EOSABS 0.2 09/29/2017 1009   BASOSABS 0.1 10/27/2023 1214   BASOSABS 0.0 09/29/2017 1009      Latest Ref Rng & Units 10/26/2022    8:58 AM 10/23/2021    9:26 AM 10/22/2020    9:39 AM  CMP  Glucose 70 - 99 mg/dL 213  086  95   BUN 8 - 23 mg/dL 16  12  16    Creatinine 0.44 - 1.00 mg/dL 5.78  4.69  6.29   Sodium 135 - 145 mmol/L 141  140  141   Potassium 3.5 - 5.1 mmol/L 4.5  4.4  4.0   Chloride 98 - 111 mmol/L 106  105  107   CO2 22 - 32 mmol/L 31  27  25    Calcium 8.9 - 10.3 mg/dL 52.8  9.8  9.8   Total Protein 6.5 - 8.1 g/dL 6.6  6.5  6.6   Total Bilirubin 0.3 - 1.2 mg/dL 0.5  0.3  0.6   Alkaline Phos 38 - 126 U/L 66  80  74   AST 15 - 41 U/L 29  28  30    ALT 0 - 44 U/L 25  30  30       STUDIES: No results found.   ASSESSMENT:  62 y.o. BRCA negative [place] woman  (1) s/p left breast lower outer quadrant and left axillary lymph node biopsies 01/18/2012 for a clinical T4 N3, stage IIIC invasive lobular carcinoma, grade 3, estrogen receptor 62% positive, progesterone receptor 5% positive, with an MIB-1 of 33% and no HER-2 amplification.  (2) Began chemotherapy in March 2013, and received 4/6 planned neoadjuvant cycles of dose dense cyclophosphamide, epirubicin and fluorouracil followed by weekly paclitaxel x 12  completed Taxol 08/02/2012.   (3) left modified radical mastectomy 08/25/2012 showed no residual tumor in the breast, but 7/8 sampled lymph nodes were positive, 6 with macro metastases. Repeat HER-2 was again negative.  (4) She completed radiation on 11/18/12   (5) started tamoxifen on  11/21/2012  (a) opted against switching to aromatase inhibitors  (6) on 01/11/2014 underwent left latissimus dorsi myocutaneous flap reconstruction of the left breast, with saline implant, and right breast mammoplasty for symmetry  (7) history of a cecal carcinoid resected 1987, with an unremarkable colonoscopy 05/14/2014  (8) hepatic steatosis, with occasional AST elevation: no trend noted  (9) osteopenia -   (a) T-score   -1.9 on dexa  12/21/14 at [facility]  (b) repeat bone density 08/24/2018 T score -1.8  (c) repeat bone density 09/04/2020 shows a T score of -2.2   PLAN:  She has completed 10 years of adjuvant tamoxifen.  Most recent mammogram in October 2024 is unremarkable.   No concerns for recurrence on physical exam. She wants to continue follow-up annually.   CBC from today shows mild polycythemia, CMP otherwise unremarkable. She will return to clinic in 1 year or sooner as needed Encouraged exercise, self breast exam. Calcium at the upper limit of normal, can discontinue ca supplement. Total time spent: 30 minutes  *Total Encounter Time as defined by the Centers for Medicare and Medicaid Services includes, in addition to the face-to-face time of a patient visit (documented in the note above) non-face-to-face time: obtaining and reviewing outside history, ordering and reviewing medications, tests or procedures, care coordination (communications with other health care professionals or caregivers) and documentation in the medical record.

## 2023-11-20 ENCOUNTER — Ambulatory Visit
Admission: EM | Admit: 2023-11-20 | Discharge: 2023-11-20 | Disposition: A | Discharge status: Home / Self Care | Payer: Managed Care, Other (non HMO) | Attending: Internal Medicine | Admitting: Internal Medicine

## 2023-11-20 DIAGNOSIS — N3001 Acute cystitis with hematuria: Secondary | ICD-10-CM | POA: Diagnosis present

## 2023-11-20 LAB — POCT URINALYSIS DIP (MANUAL ENTRY)
Bilirubin, UA: NEGATIVE
Glucose, UA: NEGATIVE mg/dL
Ketones, POC UA: NEGATIVE mg/dL
Nitrite, UA: NEGATIVE
Protein Ur, POC: NEGATIVE mg/dL
Spec Grav, UA: 1.015 (ref 1.010–1.025)
Urobilinogen, UA: 0.2 U/dL
pH, UA: 5.5 (ref 5.0–8.0)

## 2023-11-20 MED ORDER — NITROFURANTOIN MONOHYD MACRO 100 MG PO CAPS
100.0000 mg | ORAL_CAPSULE | Freq: Two times a day (BID) | ORAL | 0 refills | Status: AC
Start: 2023-11-20 — End: 2023-11-27

## 2023-11-20 NOTE — Discharge Instructions (Addendum)
The clinic will contact you with results of the urine culture done today if positive.  You may start Macrobid twice daily for 7 days.  Lots of rest and fluids.  Please follow-up with your PCP if your symptoms do not improve.  Please go to the ER for any worsening symptoms.  I hope you feel better soon!

## 2023-11-20 NOTE — ED Triage Notes (Signed)
Pt presents with c/o UTI concerns. Pt states she had discomfort when voiding since this morning. Denies other sxs

## 2023-11-20 NOTE — ED Provider Notes (Signed)
[facility]    CSN: [id number] Arrival date & time: 11/20/23  0932      History   Chief Complaint No chief complaint on file.   HPI Sydney Cervantes is a 62 y.o. female presents for dysuria.  Patient reports this morning she woke with urinary urgency and frequency.  Denies dysuria, hematuria, fevers, nausea/vomiting, flank pain.  No vaginal discharge or STD concern.  States she was treated the week after Thanksgiving for UTI via telehealth visit with Keflex and had complete resolution of symptoms.  No OTC medications have been used since onset.  No other concerns at this time  HPI  Past Medical History:  Diagnosis Date   Anemia    "chemo induced" (08/25/2012)   Breast cancer (HCC)    left   Carcinoid tumor of colon, benign    partial colectomy ,1987   GERD (gastroesophageal reflux disease)    occ; "side effect of chemo" (08/25/2012)   Hepatic steatosis 12/27/2013   Hx of colonoscopy 2000   Hx of radiation therapy 09/21/12 -11/17/12   R supraclav fossa, L chest wall/SCVL fossa/PAB mid axilla/IM nodes/scar boost   Seasonal allergies    "when molds and things come out; pollens" (08/25/2012)   Wears glasses     Patient Active Problem List   Diagnosis Date Noted   Osteopenia 01/17/2015   Hepatic steatosis 12/27/2013   Malignant neoplasm of lower-outer quadrant of left breast of female, estrogen receptor positive (HCC) 09/26/2013   Elevated liver enzymes 06/26/2013   Wears glasses    Seasonal allergies    Hx of radiation therapy     Past Surgical History:  Procedure Laterality Date   APPENDECTOMY  1987   BOWEL RESECTION  1987   Carcinoid Tumor of Appendix   BREAST BIOPSY  01/2012   left   BREAST REDUCTION SURGERY Right 01/11/2014   Procedure: RIGHT BREAST REDUCTION ;  Surgeon: Etter Sjogren, MD;  Location: [facility] OR;  Service: Plastics;  Laterality: Right;   CHOLECYSTECTOMY  2003   COLONOSCOPY N/A 05/11/2014   Procedure: COLONOSCOPY;  Surgeon: Kandis Cocking, MD;   Location: [facility];  Service: General;  Laterality: N/A;   DILATION AND CURETTAGE OF UTERUS  1992   LATISSIMUS FLAP TO BREAST Left 01/11/2014   Procedure: LEFT LATISSIMUS FLAP TO BREAST WITH SALINE IMPLANT FOR RECONSTRUCTION;  Surgeon: Etter Sjogren, MD;  Location: [facility] OR;  Service: Plastics;  Laterality: Left;   MASTECTOMY MODIFIED RADICAL  08/25/2012   left   PORTACATH PLACEMENT  02/11/2012   Procedure: INSERTION PORT-A-CATH;  Surgeon: Kandis Cocking, MD;  Location: [facility] OR;  Service: General;  Laterality: Right;  Subclavian    OB History     Gravida  4   Para  4   Term      Preterm      AB      Living         SAB      IAB      Ectopic      Multiple      Live Births           Obstetric Comments  Menarche age 6, G34 P3, BC x 15, No HRT, chemo induced menopause          Home Medications    Prior to Admission medications   Medication Sig Start Date End Date Taking? Authorizing Provider  nitrofurantoin, macrocrystal-monohydrate, (MACROBID) 100 MG capsule Take 1 capsule (100 mg total) by mouth 2 (  two) times daily for 7 days. 11/20/23 11/27/23 Yes Radford Pax, NP  cholecalciferol (VITAMIN D) 1000 units tablet Take 1 tablet (1,000 Units total) by mouth daily. 09/29/18   Magrinat, Valentino Hue, MD  Cyanocobalamin (B-12) 1000 MCG CAPS Take by mouth. 09/29/18   Magrinat, Valentino Hue, MD  Krill Oil 300 MG CAPS Take by mouth. 10/23/21   Magrinat, Valentino Hue, MD  Multiple Vitamins-Minerals (MULTIVITAMIN) tablet Take 1 tablet by mouth daily. 10/23/21   Magrinat, Valentino Hue, MD  Probiotic Product (PROBIOTIC DAILY PO) Take 2 capsules by mouth daily.    [provider]    Family History Family History  Problem Relation Age of Onset   Breast cancer Mother    Breast cancer Paternal Aunt    Cancer Maternal Grandmother        Hodgkins Disease    Social History Social History   Tobacco Use   Smoking status: Never   Smokeless tobacco: Never  Substance Use Topics    Alcohol use: Yes    Alcohol/week: 7.0 - 10.0 standard drinks of alcohol    Types: 7 - 10 Glasses of wine per week    Comment: 06/26/2013 - 1-1.5 glasses of wine nightly   Drug use: No     Allergies   Adhesive [tape] and Codeine   Review of Systems Review of Systems  Genitourinary:  Positive for dysuria.     Physical Exam Triage Vital Signs ED Triage Vitals [11/20/23 1011]  Encounter Vitals Group     BP 115/76     Systolic BP Percentile      Diastolic BP Percentile      Pulse Rate 81     Resp 16     Temp 98.7 F (37.1 C)     Temp Source Oral     SpO2 97 %     Weight      Height      Head Circumference      Peak Flow      Pain Score 4     Pain Loc      Pain Education      Exclude from Growth Chart    No data found.  Updated Vital Signs BP 115/76 (BP Location: Right Arm)   Pulse 81   Temp 98.7 F (37.1 C) (Oral)   Resp 16   LMP 01/03/2012   SpO2 97%   Visual Acuity Right Eye Distance:   Left Eye Distance:   Bilateral Distance:    Right Eye Near:   Left Eye Near:    Bilateral Near:     Physical Exam Vitals and nursing note reviewed.  Constitutional:      Appearance: Normal appearance.  HENT:     Head: Normocephalic and atraumatic.  Eyes:     Pupils: Pupils are equal, round, and reactive to light.  Cardiovascular:     Rate and Rhythm: Normal rate.  Pulmonary:     Effort: Pulmonary effort is normal.  Abdominal:     Tenderness: There is no right CVA tenderness or left CVA tenderness.  Skin:    General: Skin is warm and dry.  Neurological:     General: No focal deficit present.     Mental Status: She is alert and oriented to person, place, and time.  Psychiatric:        Mood and Affect: Mood normal.        Behavior: Behavior normal.      UC Treatments / Results  Labs (  all labs ordered are listed, but only abnormal results are displayed) Labs Reviewed  POCT URINALYSIS DIP (MANUAL ENTRY) - Abnormal; Notable for the following components:       Result Value   Color, UA light yellow (*)    Clarity, UA turbid (*)    Blood, UA large (*)    Leukocytes, UA Large (3+) (*)    All other components within normal limits  URINE CULTURE   CMP (Cancer Center only) Order: [id number]  Status: Final result     Next appt: 10/26/2024 at 01:15 PM in Oncology ([facility])     Dx: Malignant neoplasm of lower-outer qua...   Test Result Released: Yes (seen)   0 Result Notes          Component Ref Range & Units (hover) 3 wk ago 1 yr ago 2 yr ago 3 yr ago 4 yr ago 5 yr ago 6 yr ago  Sodium 139 141 140 141 141 141 141 R  Potassium 4.0 4.5 4.4 4.0 4.5 4.1 3.7 R  Chloride 102 106 105 107 104 107 107 R  CO2 31 31 27 25 26 21  Low  23 R  Glucose, Bld 100 High  106 High  CM 104 High  CM 95 CM 95 97 98 R, CM  Comment: Glucose reference range applies only to samples taken after fasting for at least 8 hours.  BUN 19 16 12  R 16 R 15 R 15 R 13.7 R  Creatinine 0.84 0.90 0.84 0.83 0.85 0.83 0.8 R  Calcium 10.3 10.4 High  9.8 9.8 9.5 9.3 9.1 R  Total Protein 6.7 6.6 6.5 6.6 6.5 6.6 6.3 Low  R  Albumin 4.2 4.3 3.9 4.0 4.0 3.9 3.8  AST 22 29 28 30  54 High  30 39 High  R  ALT 15 25 30 30  72 High  30 40 R  Alkaline Phosphatase 85 66 80 74 83 80 67 R  Total Bilirubin 0.6 0.5 R 0.3 R 0.6 R 0.4 R 0.5 R 0.58 R  GFR, Estimated >60 >60 CM >60 CM >60 CM >60 >60   Comment: (NOTE) Calculated using the CKD-EPI Creatinine Equation (2021)  Anion gap 6 4 Low  CM 8 CM 9 CM 11 CM 13 CM 11 R  Comment: Performed at [facility], [address] [place], [place] [address]  Resulting Agency [facility]          EKG   Radiology No results found.  Procedures Procedures (including critical care time)  Medications Ordered in UC Medications - No data to display  Initial Impression / Assessment and Plan / UC Course  I have reviewed the triage vital signs and  the nursing notes.  Pertinent labs & imaging results that were available during my care of the patient were reviewed by me and considered in my medical decision making (see chart for details).     Reviewed exam and symptoms with patient.  No red flags.  UA positive for UTI, will culture.  Will contact for any positive results.  Patient denies any antibiotic allergies but prefers not to use Bactrim or Doxy due to side effects.  As she is recently on Keflex we will do trial of Macrobid twice daily for 7 days.  PCP follow-up 2 to 3 days for recheck.  ER precautions reviewed. Final Clinical Impressions(s) / UC Diagnoses  Final diagnoses:  Acute cystitis with hematuria     Discharge Instructions      The clinic will contact you with results of the urine culture done today if positive.  You may start Macrobid twice daily for 7 days.  Lots of rest and fluids.  Please follow-up with your PCP if your symptoms do not improve.  Please go to the ER for any worsening symptoms.  I hope you feel better soon!   ED Prescriptions     Medication Sig Dispense Auth. Provider   nitrofurantoin, macrocrystal-monohydrate, (MACROBID) 100 MG capsule Take 1 capsule (100 mg total) by mouth 2 (two) times daily for 7 days. 14 capsule Radford Pax, NP      PDMP not reviewed this encounter.   Radford Pax, NP 11/20/23 1031

## 2023-11-21 LAB — URINE CULTURE: Culture: <10000 — AB

## 2024-02-10 ENCOUNTER — Other Ambulatory Visit: Payer: Self-pay | Admitting: Family Medicine

## 2024-02-10 DIAGNOSIS — K7581 Nonalcoholic steatohepatitis (NASH): Secondary | ICD-10-CM

## 2024-02-10 DIAGNOSIS — E785 Hyperlipidemia, unspecified: Secondary | ICD-10-CM

## 2024-02-23 ENCOUNTER — Ambulatory Visit
Admission: RE | Admit: 2024-02-23 | Discharge: 2024-02-23 | Disposition: A | Discharge status: Home / Self Care | Source: Ambulatory Visit | Attending: Family Medicine | Admitting: Family Medicine

## 2024-02-23 DIAGNOSIS — K7581 Nonalcoholic steatohepatitis (NASH): Secondary | ICD-10-CM

## 2024-02-23 DIAGNOSIS — E785 Hyperlipidemia, unspecified: Secondary | ICD-10-CM

## 2024-09-18 ENCOUNTER — Encounter: Payer: Self-pay | Admitting: Hematology and Oncology

## 2024-09-21 ENCOUNTER — Encounter: Payer: Self-pay | Admitting: Hematology and Oncology

## 2024-10-25 ENCOUNTER — Other Ambulatory Visit: Payer: Self-pay

## 2024-10-25 ENCOUNTER — Telehealth: Payer: Self-pay

## 2024-10-25 DIAGNOSIS — Z170 Estrogen receptor positive status [ER+]: Secondary | ICD-10-CM

## 2024-10-25 NOTE — Telephone Encounter (Signed)
 Spoke with patient and confirmed appointment on 11/20

## 2024-10-26 ENCOUNTER — Inpatient Hospital Stay: Payer: Managed Care, Other (non HMO) | Admitting: Hematology and Oncology

## 2024-10-26 ENCOUNTER — Inpatient Hospital Stay: Payer: Managed Care, Other (non HMO) | Attending: Hematology and Oncology

## 2024-10-26 VITALS — BP 132/93 | HR 85 | Temp 98.0°F | Resp 17 | Wt 138.1 lb

## 2024-10-26 DIAGNOSIS — Z170 Estrogen receptor positive status [ER+]: Secondary | ICD-10-CM | POA: Insufficient documentation

## 2024-10-26 DIAGNOSIS — C50512 Malignant neoplasm of lower-outer quadrant of left female breast: Secondary | ICD-10-CM | POA: Diagnosis not present

## 2024-10-26 DIAGNOSIS — Z79810 Long term (current) use of selective estrogen receptor modulators (SERMs): Secondary | ICD-10-CM | POA: Insufficient documentation

## 2024-10-26 DIAGNOSIS — E785 Hyperlipidemia, unspecified: Secondary | ICD-10-CM | POA: Insufficient documentation

## 2024-10-26 DIAGNOSIS — Z923 Personal history of irradiation: Secondary | ICD-10-CM | POA: Insufficient documentation

## 2024-10-26 DIAGNOSIS — Z9012 Acquired absence of left breast and nipple: Secondary | ICD-10-CM | POA: Insufficient documentation

## 2024-10-26 DIAGNOSIS — Z1721 Progesterone receptor positive status: Secondary | ICD-10-CM | POA: Insufficient documentation

## 2024-10-26 DIAGNOSIS — D751 Secondary polycythemia: Secondary | ICD-10-CM | POA: Insufficient documentation

## 2024-10-26 DIAGNOSIS — M810 Age-related osteoporosis without current pathological fracture: Secondary | ICD-10-CM | POA: Insufficient documentation

## 2024-10-26 DIAGNOSIS — K760 Fatty (change of) liver, not elsewhere classified: Secondary | ICD-10-CM | POA: Insufficient documentation

## 2024-10-26 DIAGNOSIS — Z85828 Personal history of other malignant neoplasm of skin: Secondary | ICD-10-CM | POA: Insufficient documentation

## 2024-10-26 DIAGNOSIS — Z803 Family history of malignant neoplasm of breast: Secondary | ICD-10-CM | POA: Insufficient documentation

## 2024-10-26 LAB — CBC WITH DIFFERENTIAL (CANCER CENTER ONLY)
Abs Immature Granulocytes: 0 K/uL (ref 0.00–0.07)
Basophils Absolute: 0 K/uL (ref 0.0–0.1)
Basophils Relative: 1 %
Eosinophils Absolute: 0.3 K/uL (ref 0.0–0.5)
Eosinophils Relative: 5 %
HCT: 43 % (ref 36.0–46.0)
Hemoglobin: 14.3 g/dL (ref 12.0–15.0)
Immature Granulocytes: 0 %
Lymphocytes Relative: 31 %
Lymphs Abs: 1.5 K/uL (ref 0.7–4.0)
MCH: 32.2 pg (ref 26.0–34.0)
MCHC: 33.3 g/dL (ref 30.0–36.0)
MCV: 96.8 fL (ref 80.0–100.0)
Monocytes Absolute: 0.5 K/uL (ref 0.1–1.0)
Monocytes Relative: 9 %
Neutro Abs: 2.7 K/uL (ref 1.7–7.7)
Neutrophils Relative %: 54 %
Platelet Count: 158 K/uL (ref 150–400)
RBC: 4.44 MIL/uL (ref 3.87–5.11)
RDW: 13.2 % (ref 11.5–15.5)
WBC Count: 4.9 K/uL (ref 4.0–10.5)
nRBC: 0 % (ref 0.0–0.2)

## 2024-10-26 LAB — CMP (CANCER CENTER ONLY)
ALT: 20 U/L (ref 0–44)
AST: 31 U/L (ref 15–41)
Albumin: 4.6 g/dL (ref 3.5–5.0)
Alkaline Phosphatase: 101 U/L (ref 38–126)
Anion gap: 9 (ref 5–15)
BUN: 15 mg/dL (ref 8–23)
CO2: 30 mmol/L (ref 22–32)
Calcium: 10 mg/dL (ref 8.9–10.3)
Chloride: 103 mmol/L (ref 98–111)
Creatinine: 0.74 mg/dL (ref 0.44–1.00)
GFR, Estimated: >60 mL/min (ref >60)
Glucose, Bld: 89 mg/dL (ref 70–99)
Potassium: 4.6 mmol/L (ref 3.5–5.1)
Sodium: 141 mmol/L (ref 135–145)
Total Bilirubin: 0.4 mg/dL (ref 0.0–1.2)
Total Protein: 6.9 g/dL (ref 6.5–8.1)

## 2024-10-26 NOTE — Progress Notes (Signed)
 Sydney Cervantes   DOB: 1961-03-06  MR#: [id number]  RDW#:[id number]  Patient Care Team: Burney Darice CROME, MD as PCP - General (Family Medicine) Stuart Norris, Sydney Cervantes as Nurse Practitioner (Obstetrics and Gynecology) Loretha Ash, MD as Medical Oncologist (Hematology and Oncology) OTHER MD: Alm Sick, MD  CHIEF COMPLAINT: Estrogen receptor positive breast cancer (s/p left mastectomy)  CURRENT TREATMENT: Observation  INTERVAL HISTORY:  Sydney Cervantes returns today for follow-up of her estrogen receptor positive breast cancer.  She completed 10 years of adjuvant tamoxifen .  She is currently on observation alone.  Discussed the use of AI scribe software for clinical note transcription with the patient, who gave verbal consent to proceed.  History of Present Illness Sydney Cervantes is a 63 year old female who presents for follow-up regarding osteopenia and skin cancer. She was referred by her GP to an endocrinologist for management of osteopenia.  She has been diagnosed with osteopenia, with one area showing osteoporosis. She is currently on Fosamax and vitamin D3 supplements and engages in regular walking as part of her treatment plan.  She completed breast cancer treatment nearly twelve years ago, in December. A recent mammogram in October was normal, and she is scheduled to return in one year for follow-up.  She had two skin cancers removed, one near a radiation landmark and another from the back of her leg. The first was a basal cell carcinoma. She has a follow-up appointment scheduled for early December.  She continues to practice yoga regularly and is taking Crestor 10 mg for hyperlipidemia and tretinoin 0.025% for facial skin concerns. No changes in keloid scars, such as pain, growth, or color change.    Rest of the pertinent 10 point ROS reviewed and negative   COVID 19 VACCINATION STATUS: Status post Pfizer x4, also had COVID May 2022   BREAST CANCER HISTORY: From Dr. Maude Barrows  prior summary note:  1. 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  ER/PR positive at 63/5% respectively, HER-2 negative, elevated Ki-67. Completed 4/6 planned neoadjuvant dose dense FEC with Neulasta  support on day 2. 20% dose reduction on 5-FU, 50% dose reduction on epirubicin , 40% dose reduction on Cytoxan , after one-week delay due to anemia/thrombocytopenia/and region of probable localized thrombophlebitis versus mild chemotherapy extravasation on the left forearm, covered with Keflex  500 mg by mouth 3 times a day. Switched to single agent neoadjuvant Taxol , given 3 weeks on 1 week off, completed 4 cycles on 08/02/2012.   2. Status post left modified radical mastectomy on 08/25/2012 with final pathology showing no tumor in the primary tumor bed with 7 of 8 involved lymph nodes. The final pathology showed an ER positive tumor, HER-2 negative.  She completed radiation on 11/18/12 and started taking Tamoxifen  on 11/21/2012.  It is also noted that she is BRCA negative.   Her subsequent history is as detailed below   PAST MEDICAL HISTORY: Past Medical History:  Diagnosis Date   Anemia    chemo induced (08/25/2012)   Breast cancer (HCC)    left   Carcinoid tumor of colon, benign    partial colectomy ,1987   GERD (gastroesophageal reflux disease)    occ; side effect of chemo (08/25/2012)   Hepatic steatosis 12/27/2013   Hx of colonoscopy 2000   Hx of radiation therapy 09/21/12 -11/17/12   R supraclav fossa, L chest wall/SCVL fossa/PAB mid axilla/IM nodes/scar boost   Seasonal allergies  when molds and things come out; pollens (08/25/2012)   Wears glasses     PAST SURGICAL HISTORY: Past Surgical History:  Procedure Laterality Date   APPENDECTOMY  1987   BOWEL RESECTION  1987   Carcinoid Tumor of Appendix   BREAST BIOPSY  01/2012   left   BREAST REDUCTION SURGERY Right 01/11/2014    Procedure: RIGHT BREAST REDUCTION ;  Surgeon: Alm Sick, MD;  Location: [facility] OR;  Service: Plastics;  Laterality: Right;   CHOLECYSTECTOMY  2003   COLONOSCOPY N/A 05/11/2014   Procedure: COLONOSCOPY;  Surgeon: Alm VEAR Angle, MD;  Location: [facility];  Service: General;  Laterality: N/A;   DILATION AND CURETTAGE OF UTERUS  1992   LATISSIMUS FLAP TO BREAST Left 01/11/2014   Procedure: LEFT LATISSIMUS FLAP TO BREAST WITH SALINE IMPLANT FOR RECONSTRUCTION;  Surgeon: Alm Sick, MD;  Location: [facility] OR;  Service: Plastics;  Laterality: Left;   MASTECTOMY MODIFIED RADICAL  08/25/2012   left   PORTACATH PLACEMENT  02/11/2012   Procedure: INSERTION PORT-A-CATH;  Surgeon: Alm VEAR Angle, MD;  Location: [facility] OR;  Service: General;  Laterality: Right;  Subclavian    FAMILY HISTORY Family History  Problem Relation Age of Onset   Breast cancer Mother    Breast cancer Paternal Aunt    Cancer Maternal Grandmother        Hodgkins Disease    GYNECOLOGIC HISTORY:   G3P2.  Menarche at 63 years of age.  Followed by Sydney Hammersmith, Sydney Cervantes.     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 Scott works in CONSULTING CIVIL ENGINEER for [employer]. Daughter Izetta currently 15 years old is a Buyer, Retail in psychology and exercise physiology hoping to become an occupational therapist. Daughter Damien studied investment banker, corporate at [employer] and hopes to be in government ; she is currently living at home with the patient. Daughter Annitta is currently in [place]  studying music History.   ADVANCED DIRECTIVES: In the absence of any documentation to the contrary, the patient's spouse is their HCPOA.    HEALTH MAINTENANCE:   Social History   Socioeconomic History   Marital status: Married    Spouse name: Not on file   Number of children: Not on file   Years of education:  Not on file   Highest education level: Not on file  Occupational History   Not on file  Tobacco Use   Smoking status: Never   Smokeless tobacco: Never  Substance and Sexual Activity   Alcohol use: Yes    Alcohol/week: 7.0 - 10.0 standard drinks of alcohol    Types: 7 - 10 Glasses of wine per week    Comment: 06/26/2013 - 1-1.5 glasses of wine nightly   Drug use: No   Sexual activity: Yes  Other Topics Concern   Not on file  Social History Narrative   Not on file   Social Drivers of Health   Financial Resource Strain: Not on file  Food Insecurity: Not on file  Transportation Needs: Not on file  Physical Activity: Not on file  Stress: Not on file  Social Connections: Not on file  Intimate Partner Violence: Not on file     COLONOSCOPY: Approx 2000  PAP:  Not on file  BONE DENSITY:  08/2020, -2.2  LIPIDS:  Not on file    Allergies  Allergen Reactions  Adhesive [Tape] Itching and Rash    Rash and itching at steristrip and EKG lead pad sites   Codeine     Headache    Current Outpatient Medications  Medication Sig Dispense Refill   cholecalciferol (VITAMIN D ) 1000 units tablet Take 1 tablet (1,000 Units total) by mouth daily.     Cyanocobalamin (B-12) 1000 MCG CAPS Take by mouth.     Krill Oil 300 MG CAPS Take by mouth.     Multiple Vitamins-Minerals (MULTIVITAMIN) tablet Take 1 tablet by mouth daily.     Probiotic Product (PROBIOTIC DAILY PO) Take 2 capsules by mouth daily.     No current facility-administered medications for this visit.   OBJECTIVE: White woman who appears younger than stated age  Vitals:   10/26/24 1328  BP: (!) 132/93  Pulse: 85  Resp: 17  Temp: 98 F (36.7 C)  SpO2: 99%    Body mass index is 21 kg/m. ECOG FS: 0 - Asymptomatic  Physical Exam Constitutional:      Appearance: Normal appearance.  Chest:     Comments: Left breast status postmastectomy.  No concern for local recurrence.  Right breast status postreduction.  No palpable  masses or regional adenopathy Musculoskeletal:        General: No swelling.     Cervical back: Normal range of motion and neck supple. No rigidity.  Lymphadenopathy:     Cervical: No cervical adenopathy.  Neurological:     Mental Status: She is alert.       LAB RESULTS:  CBC    Component Value Date/Time   WBC 4.9 10/26/2024 1307   WBC 4.4 09/29/2017 1009   WBC 5.4 01/02/2014 1337   RBC 4.44 10/26/2024 1307   HGB 14.3 10/26/2024 1307   HGB 14.4 09/29/2017 1009   HCT 43.0 10/26/2024 1307   HCT 43.4 09/29/2017 1009   PLT 158 10/26/2024 1307   PLT 133 (L) 09/29/2017 1009   MCV 96.8 10/26/2024 1307   MCV 99.1 09/29/2017 1009   MCH 32.2 10/26/2024 1307   MCHC 33.3 10/26/2024 1307   RDW 13.2 10/26/2024 1307   RDW 13.4 09/29/2017 1009   LYMPHSABS 1.5 10/26/2024 1307   LYMPHSABS 1.3 09/29/2017 1009   MONOABS 0.5 10/26/2024 1307   MONOABS 0.5 09/29/2017 1009   EOSABS 0.3 10/26/2024 1307   EOSABS 0.2 09/29/2017 1009   BASOSABS 0.0 10/26/2024 1307   BASOSABS 0.0 09/29/2017 1009      Latest Ref Rng & Units 10/27/2023   12:14 PM 10/26/2022    8:58 AM 10/23/2021    9:26 AM  CMP  Glucose 70 - 99 mg/dL 899  893  895   BUN 8 - 23 mg/dL 19  16  12    Creatinine 0.44 - 1.00 mg/dL 9.15  9.09  9.15   Sodium 135 - 145 mmol/L 139  141  140   Potassium 3.5 - 5.1 mmol/L 4.0  4.5  4.4   Chloride 98 - 111 mmol/L 102  106  105   CO2 22 - 32 mmol/L 31  31  27    Calcium 8.9 - 10.3 mg/dL 89.6  89.5  9.8   Total Protein 6.5 - 8.1 g/dL 6.7  6.6  6.5   Total Bilirubin <1.2 mg/dL 0.6  0.5  0.3   Alkaline Phos 38 - 126 U/L 85  66  80   AST 15 - 41 U/L 22  29  28    ALT 0 - 44 U/L 15  25  30      STUDIES: No results found.   ASSESSMENT:  63 y.o. BRCA negative [place] woman  (1) s/p left breast lower outer quadrant and left axillary lymph node biopsies 01/18/2012 for a clinical T4 N3, stage IIIC invasive lobular carcinoma, grade 3, estrogen receptor 62% positive, progesterone receptor  5% positive, with an MIB-1 of 33% and no HER-2 amplification.  (2) Began chemotherapy in March 2013, and received 4/6 planned neoadjuvant cycles of dose dense cyclophosphamide , epirubicin  and fluorouracil  followed by weekly paclitaxel  x 12 completed Taxol  08/02/2012.   (3) left modified radical mastectomy 08/25/2012 showed no residual tumor in the breast, but 7/8 sampled lymph nodes were positive, 6 with macro metastases. Repeat HER-2 was again negative.  (4) She completed radiation on 11/18/12   (5) started tamoxifen  on 11/21/2012  (a) opted against switching to aromatase inhibitors  (6) on 01/11/2014 underwent left latissimus dorsi myocutaneous flap reconstruction of the left breast, with saline implant, and right breast mammoplasty for symmetry  (7) history of a cecal carcinoid resected 1987, with an unremarkable colonoscopy 05/14/2014  (8) hepatic steatosis, with occasional AST elevation: no trend noted  (9) osteopenia -   (a) T-score   -1.9 on dexa  12/21/14 at [facility]  (b) repeat bone density 08/24/2018 T score -1.8  (c) repeat bone density 09/04/2020 shows a T score of -2.2   PLAN:  She has completed 10 years of adjuvant tamoxifen .  Most recent mammogram in October 2025 is unremarkable.   No concerns for recurrence on physical exam. She wants to continue follow-up annually.   CBC from today shows normal counts, CMP is normal. She will return to clinic in 1 year or sooner as needed Encouraged exercise, self breast exam and RTC sooner as needed.  Total time spent: 20 minutes  *Total Encounter Time as defined by the Centers for Medicare and Medicaid Services includes, in addition to the face-to-face time of a patient visit (documented in the note above) non-face-to-face time: obtaining and reviewing outside history, ordering and reviewing medications, tests or procedures, care coordination (communications with other health care professionals or caregivers) and documentation in the  medical record.

## 2024-10-26 NOTE — Progress Notes (Signed)
 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  She completed 10 years of adjuvant tamoxifen . Mammogram done in October, BIRADS 1 category negative. Rest of the pertinent 10 point ROS reviewed and negative   COVID 19 VACCINATION STATUS: Status post Pfizer x4, also had COVID May 2022   BREAST CANCER HISTORY: From Dr. Maude Barrows prior summary note:  1. 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  ER/PR positive at 63/5% respectively, HER-2 negative, elevated Ki-67. Completed 4/6 planned neoadjuvant dose dense FEC with Neulasta  support on day 2. 20% dose reduction on 5-FU, 50% dose reduction on epirubicin , 40% dose reduction on Cytoxan , after one-week delay due to anemia/thrombocytopenia/and region of probable localized thrombophlebitis versus mild chemotherapy extravasation on the left forearm, covered with Keflex  500 mg by mouth 3 times a day. Switched to single agent neoadjuvant Taxol , given 3 weeks on 1 week off, completed 4 cycles on 08/02/2012.   2. Status post left modified radical mastectomy on 08/25/2012 with final pathology showing no tumor in the primary tumor bed with 7 of 8 involved lymph nodes. The final pathology showed an ER positive tumor, HER-2 negative.  She completed radiation on 11/18/12 and started taking Tamoxifen  on 11/21/2012.  It is also noted that she is BRCA negative.   Her subsequent history is  as detailed below   PAST MEDICAL HISTORY: Past Medical History:  Diagnosis Date   Anemia    chemo induced (08/25/2012)   Breast cancer (HCC)    left   Carcinoid tumor of colon, benign    partial colectomy ,1987   GERD (gastroesophageal reflux disease)    occ; side effect of chemo (08/25/2012)   Hepatic steatosis 12/27/2013   Hx of colonoscopy 2000   Hx of radiation therapy 09/21/12 -11/17/12   R supraclav fossa, L chest wall/SCVL fossa/PAB mid axilla/IM nodes/scar boost   Seasonal allergies    when molds and things come out; pollens (08/25/2012)   Wears glasses     PAST SURGICAL HISTORY: Past Surgical History:  Procedure Laterality Date   APPENDECTOMY  1987   BOWEL RESECTION  1987   Carcinoid Tumor of Appendix   BREAST BIOPSY  01/2012   left   BREAST REDUCTION SURGERY Right 01/11/2014   Procedure: RIGHT BREAST REDUCTION ;  Surgeon: Alm Sick, MD;  Location: [facility] OR;  Service: Plastics;  Laterality: Right;   CHOLECYSTECTOMY  2003   COLONOSCOPY N/A 05/11/2014   Procedure: COLONOSCOPY;  Surgeon: Alm VEAR Angle, MD;  Location: [facility];  Service: General;  Laterality: N/A;   DILATION AND CURETTAGE OF UTERUS  1992   LATISSIMUS FLAP TO BREAST Left 01/11/2014   Procedure: LEFT LATISSIMUS FLAP TO BREAST WITH SALINE IMPLANT FOR RECONSTRUCTION;  Surgeon: Alm Sick, MD;  Location: [facility] OR;  Service: Plastics;  Laterality: Left;   MASTECTOMY MODIFIED RADICAL  08/25/2012   left   PORTACATH PLACEMENT  02/11/2012   Procedure: INSERTION PORT-A-CATH;  Surgeon: Alm VEAR Angle, MD;  Location: [facility] OR;  Service: General;  Laterality: Right;  Subclavian    FAMILY HISTORY Family History  Problem Relation Age of Onset   Breast cancer Mother    Breast cancer Paternal Aunt    Cancer Maternal Grandmother        Hodgkins Disease    GYNECOLOGIC HISTORY:   G3P2.  Menarche at 64 years of age.  Followed by Landry Hammersmith, NP.     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 Scott works in CONSULTING CIVIL ENGINEER for [employer]. Daughter Izetta currently 76 years old is a Buyer, Retail in psychology and exercise physiology hoping to become an occupational therapist. Daughter Damien studied investment banker, corporate at [employer] and hopes to be in government ; she is currently living at home with the patient. Daughter Annitta is currently in [place]  studying music History.   ADVANCED DIRECTIVES: In the absence of any documentation to the contrary, the patient's spouse is their HCPOA.    HEALTH MAINTENANCE:   Social History   Socioeconomic History   Marital status: Married    Spouse name: Not on file   Number of children: Not on file   Years of education: Not on file   Highest education level: Not on file  Occupational History   Not on file  Tobacco Use   Smoking status: Never   Smokeless tobacco: Never  Substance and Sexual Activity   Alcohol use: Yes    Alcohol/week: 7.0 - 10.0 standard drinks of alcohol    Types: 7 - 10 Glasses of wine per week    Comment: 06/26/2013 - 1-1.5 glasses of wine nightly   Drug use: No   Sexual activity: Yes  Other Topics Concern   Not on file  Social History Narrative   Not on file   Social Drivers of Health   Financial Resource Strain: Not on file  Food Insecurity: Not on file  Transportation Needs: Not on file  Physical Activity: Not on file  Stress: Not on file  Social Connections: Not on file  Intimate Partner Violence: Not on file     COLONOSCOPY: Approx 2000  PAP:  Not on file  BONE DENSITY:  08/2020, -2.2  LIPIDS:  Not on file    Allergies  Allergen Reactions   Adhesive [Tape] Itching and Rash    Rash and itching at steristrip and EKG lead pad sites   Codeine     Headache    Current Outpatient Medications  Medication Sig Dispense Refill   cholecalciferol (VITAMIN D ) 1000  units tablet Take 1 tablet (1,000 Units total) by mouth daily.     Cyanocobalamin (B-12) 1000 MCG CAPS Take by mouth.     Krill Oil 300 MG CAPS Take by mouth.     Multiple Vitamins-Minerals (MULTIVITAMIN) tablet Take 1 tablet by mouth daily.     Probiotic Product (PROBIOTIC DAILY PO) Take 2 capsules by mouth daily.     No current facility-administered medications for this visit.   OBJECTIVE: White woman who appears younger than stated age  Vitals:   10/26/24 1328  BP: (!) 132/93  Pulse: 85  Resp: 17  Temp: 98 F (36.7 C)  SpO2: 99%    Body mass index is 21 kg/m. ECOG FS: 0 - Asymptomatic  Physical Exam Constitutional:      Appearance: Normal appearance.  Chest:     Comments: Left breast status  postmastectomy.  No concern for local recurrence.  Right breast status postreduction.  No palpable masses or regional adenopathy Musculoskeletal:        General: No swelling.     Cervical back: Normal range of motion and neck supple. No rigidity.  Lymphadenopathy:     Cervical: No cervical adenopathy.  Neurological:     Mental Status: She is alert.       LAB RESULTS:  CBC    Component Value Date/Time   WBC 4.9 10/26/2024 1307   WBC 4.4 09/29/2017 1009   WBC 5.4 01/02/2014 1337   RBC 4.44 10/26/2024 1307   HGB 14.3 10/26/2024 1307   HGB 14.4 09/29/2017 1009   HCT 43.0 10/26/2024 1307   HCT 43.4 09/29/2017 1009   PLT 158 10/26/2024 1307   PLT 133 (L) 09/29/2017 1009   MCV 96.8 10/26/2024 1307   MCV 99.1 09/29/2017 1009   MCH 32.2 10/26/2024 1307   MCHC 33.3 10/26/2024 1307   RDW 13.2 10/26/2024 1307   RDW 13.4 09/29/2017 1009   LYMPHSABS 1.5 10/26/2024 1307   LYMPHSABS 1.3 09/29/2017 1009   MONOABS 0.5 10/26/2024 1307   MONOABS 0.5 09/29/2017 1009   EOSABS 0.3 10/26/2024 1307   EOSABS 0.2 09/29/2017 1009   BASOSABS 0.0 10/26/2024 1307   BASOSABS 0.0 09/29/2017 1009      Latest Ref Rng & Units 10/27/2023   12:14 PM 10/26/2022    8:58 AM 10/23/2021    9:26  AM  CMP  Glucose 70 - 99 mg/dL 899  893  895   BUN 8 - 23 mg/dL 19  16  12    Creatinine 0.44 - 1.00 mg/dL 9.15  9.09  9.15   Sodium 135 - 145 mmol/L 139  141  140   Potassium 3.5 - 5.1 mmol/L 4.0  4.5  4.4   Chloride 98 - 111 mmol/L 102  106  105   CO2 22 - 32 mmol/L 31  31  27    Calcium 8.9 - 10.3 mg/dL 89.6  89.5  9.8   Total Protein 6.5 - 8.1 g/dL 6.7  6.6  6.5   Total Bilirubin <1.2 mg/dL 0.6  0.5  0.3   Alkaline Phos 38 - 126 U/L 85  66  80   AST 15 - 41 U/L 22  29  28    ALT 0 - 44 U/L 15  25  30       STUDIES: No results found.   ASSESSMENT:  63 y.o. BRCA negative [place] woman  (1) s/p left breast lower outer quadrant and left axillary lymph node biopsies 01/18/2012 for a clinical T4 N3, stage IIIC invasive lobular carcinoma, grade 3, estrogen receptor 62% positive, progesterone receptor 5% positive, with an MIB-1 of 33% and no HER-2 amplification.  (2) Began chemotherapy in March 2013, and received 4/6 planned neoadjuvant cycles of dose dense cyclophosphamide , epirubicin  and fluorouracil  followed by weekly paclitaxel  x 12 completed Taxol  08/02/2012.   (3) left modified radical mastectomy 08/25/2012 showed no residual tumor in the breast, but 7/8 sampled lymph nodes were positive, 6 with macro metastases. Repeat HER-2 was again negative.  (4) She completed radiation on 11/18/12   (5) started tamoxifen  on 11/21/2012  (a) opted against switching to aromatase inhibitors  (6) on 01/11/2014 underwent left latissimus dorsi myocutaneous flap reconstruction of the left breast, with saline implant, and right breast mammoplasty for symmetry  (7) history of a cecal carcinoid resected 1987, with an unremarkable colonoscopy 05/14/2014  (8) hepatic steatosis,  with occasional AST elevation: no trend noted  (9) osteopenia -   (a) T-score   -1.9 on dexa  12/21/14 at [facility]  (b) repeat bone density 08/24/2018 T score -1.8  (c) repeat bone density 09/04/2020 shows a T score of  -2.2   PLAN:  She has completed 10 years of adjuvant tamoxifen .  Most recent mammogram in October 2024 is unremarkable.   No concerns for recurrence on physical exam. She wants to continue follow-up annually.   CBC from today shows mild polycythemia, CMP otherwise unremarkable. She will return to clinic in 1 year or sooner as needed Encouraged exercise, self breast exam. Calcium at the upper limit of normal, can discontinue ca supplement. Total time spent: 30 minutes  *Total Encounter Time as defined by the Centers for Medicare and Medicaid Services includes, in addition to the face-to-face time of a patient visit (documented in the note above) non-face-to-face time: obtaining and reviewing outside history, ordering and reviewing medications, tests or procedures, care coordination (communications with other health care professionals or caregivers) and documentation in the medical record.

## 2025-10-26 ENCOUNTER — Inpatient Hospital Stay

## 2025-10-26 ENCOUNTER — Inpatient Hospital Stay: Admitting: Hematology and Oncology
